# Patient Record
Sex: Female | Born: 1945 | Race: Asian | Hispanic: No | Marital: Married | Smoking: Never smoker
Health system: Southern US, Community
[De-identification: ages and names within clinical notes are randomized; demographics above are authoritative.]

## PROBLEM LIST (undated history)

## (undated) DIAGNOSIS — E119 Type 2 diabetes mellitus without complications: Secondary | ICD-10-CM

## (undated) DIAGNOSIS — I10 Essential (primary) hypertension: Secondary | ICD-10-CM

## (undated) DIAGNOSIS — R7303 Prediabetes: Secondary | ICD-10-CM

## (undated) DIAGNOSIS — E785 Hyperlipidemia, unspecified: Secondary | ICD-10-CM

## (undated) DIAGNOSIS — D649 Anemia, unspecified: Secondary | ICD-10-CM

## (undated) HISTORY — DX: Hyperlipidemia, unspecified: E78.5

## (undated) HISTORY — DX: Anemia, unspecified: D64.9

## (undated) HISTORY — DX: Prediabetes: R73.03

## (undated) HISTORY — DX: Type 2 diabetes mellitus without complications: E11.9

## (undated) HISTORY — DX: Essential (primary) hypertension: I10

---

## 2003-11-16 ENCOUNTER — Emergency Department (HOSPITAL_COMMUNITY): Admission: EM | Admit: 2003-11-16 | Discharge: 2003-11-16 | Payer: Self-pay | Admitting: Emergency Medicine

## 2011-10-19 ENCOUNTER — Ambulatory Visit (INDEPENDENT_AMBULATORY_CARE_PROVIDER_SITE_OTHER): Payer: Medicare Other | Admitting: Emergency Medicine

## 2011-10-19 VITALS — BP 170/81 | HR 70 | Temp 98.5°F | Resp 18 | Ht 60.5 in | Wt 131.2 lb

## 2011-10-19 DIAGNOSIS — S335XXA Sprain of ligaments of lumbar spine, initial encounter: Secondary | ICD-10-CM

## 2011-10-19 DIAGNOSIS — S39012A Strain of muscle, fascia and tendon of lower back, initial encounter: Secondary | ICD-10-CM

## 2011-10-19 MED ORDER — CYCLOBENZAPRINE HCL 10 MG PO TABS: 10.0000 mg | ORAL_TABLET | Freq: Three times a day (TID) | ORAL | Status: AC | PRN

## 2011-10-19 MED ORDER — NAPROXEN SODIUM 550 MG PO TABS: 550.0000 mg | ORAL_TABLET | Freq: Two times a day (BID) | ORAL | Status: AC

## 2011-10-19 NOTE — Progress Notes (Signed)
   Date:  10/19/2011   Name:  Becky Turner   DOB:  09-10-1945   MRN:  [id number] Gender: female Age: 66 y.o.  PCP:  No primary provider on file.    Chief Complaint: Back Pain   History of Present Illness:  Becky Turner is a 66 y.o. pleasant patient who presents with the following:  Patient awoke on Saturday with pain in her low back in sacral area.  No history of injury or overuse.  No lifting, no falls.  Has history of prior low back pain.  No GI or GU incontinence.  No numbness or tingling, no radiation of pain.  Has tried OTC medications without relief of pain.    There is no problem list on file for this patient.   No past medical history on file.  No past surgical history on file.  History  Substance Use Topics  . Smoking status: Never Smoker   . Smokeless tobacco: Never Used  . Alcohol Use: No    No family history on file.  No Known Allergies  Medication list has been reviewed and updated.  No current outpatient prescriptions on file prior to visit.    Review of Systems:  As per HPI, otherwise negative.    Physical Examination: Filed Vitals:   10/19/11 1902  BP: 170/81  Pulse: 70  Temp: 98.5 F (36.9 C)  Resp: 18   Filed Vitals:   10/19/11 1902  Height: 5' 0.5" (1.537 m)  Weight: 131 lb 3.2 oz (59.512 kg)   Body mass index is 25.20 kg/(m^2). Ideal Body Weight: Weight in (lb) to have BMI = 25: 129.9    GEN: WDWN, NAD, Non-toxic, Alert & Oriented x 3 HEENT: Atraumatic, Normocephalic.  Ears and Nose: No external deformity. EXTR: No clubbing/cyanosis/edema NEURO: Normal gait.  PSYCH: Normally interactive. Conversant. Not depressed or anxious appearing.  Calm demeanor.  BACK:  Tender in sacrum bilaterally.  Gross motor and cerebellar intact.  DTR's intact and 2+ bilaterally knee and achilles.  Babinski negative  Assessment and Plan: Lumbar strain    Carmelina Dane, MD Meds ordered this encounter  Medications  . naproxen sodium (ANAPROX DS)  550 MG tablet    Sig: Take 1 tablet (550 mg total) by mouth 2 (two) times daily with a meal.    Dispense:  40 tablet    Refill:  0  . cyclobenzaprine (FLEXERIL) 10 MG tablet    Sig: Take 1 tablet (10 mg total) by mouth 3 (three) times daily as needed for muscle spasms.    Dispense:  30 tablet    Refill:  0   I have reviewed and agree with documentation. Robert P. Merla Riches, M.D.

## 2011-11-01 ENCOUNTER — Ambulatory Visit (INDEPENDENT_AMBULATORY_CARE_PROVIDER_SITE_OTHER): Payer: Medicare Other | Admitting: Family Medicine

## 2011-11-01 VITALS — BP 157/89 | HR 89 | Temp 97.6°F | Resp 17 | Ht 60.0 in | Wt 131.0 lb

## 2011-11-01 DIAGNOSIS — R42 Dizziness and giddiness: Secondary | ICD-10-CM

## 2011-11-01 MED ORDER — MECLIZINE HCL 25 MG PO TABS: 25.0000 mg | ORAL_TABLET | Freq: Three times a day (TID) | ORAL | Status: DC | PRN

## 2011-11-01 NOTE — Patient Instructions (Addendum)
Ch?ng Chng M?t (Vertigo) Chng m?t c ngh?a l b?n c?m th?y nh? b?n hay m?i th? xung quanh b?n ?ang di chuy?n khi th?c t? khng ph?i nh? v?y. Chng m?t c th? nguy hi?m n?u n x?y ra khi b?n ?ang ? n?i lm vi?c, li xe ho?c th?c hi?n cc ho?t ??ng kh kh?n.  NGUYN NHN  Chng m?t x?y ra khi c s? xung ??t c?a cc tn hi?u ???c g?i ln no t? h? th?ng th? gic v h? th?ng c?m gic trong c? th?. C nhi?u nguyn nhn khc nhau gy chng m?t, bao g?m:   Nhi?m trng, ??c bi?t l ? tai trong.   Ph?n ?ng x?u v?i m?t lo?i thu?c ho?c l?m d?ng r??u v thu?c men.   Cai ma ty ho?c r??u.   Thay ??i v? tr nhanh, ch?ng h?n nh? n?m xu?ng ho?c l?n mnh trn gi??ng.   ?au n?a ??u.   Dng mu ln no gi?m.   p l?c trong no t?ng sau ch?n th??ng ??u, nhi?m trng, kh?i u ho?c ch?y mu.  TRI?U CH?NG  N c th? c v? nh? l th? gi?i ?ang quay cu?ng ho?c b?n ?ang r?i xu?ng ??t. B?i v s? cn b?ng c?a b?n b? ??o l?n, chng m?t c th? khi?n b?n c?m th?y kh ch?u ? d? dy (bu?n nn) v nn m?a. B?n c th? c c? ??ng m?t ngoi  mu?n (gi?t c?u m?t). CH?N ?ON  Chng m?t th??ng ???c ch?n ?on b?ng cch khm tr?c ti?p. N?u khng xc ??nh ???c nguyn nhn gy chng m?t, chuyn gia ch?m [place] y t? c th? th?c hi?n cc xt nghi?m hnh ?nh, ch?ng h?n nh? ch?p MRI (t?o ?nh c?ng h??ng t?).  ?I?U TR?  H?u h?t cc tr??ng h?p chng m?t t? chng tiu tan m khng c?n ?i?u tr?Marland Kitchen Ty thu?c vo nguyn nhn, chuyn gia ch?m [place] y t? c th? k m?t s? thu?c nh?t ??nh. N?u chng m?t c lin quan ??n cc v?n ?? v? v? tr c? th?, chuyn gia ch?m [place] y t? c th? ?? xu?t cc ??ng tc ho?c ph??ng php ?? kh?c ph?c v?n ?? ny. Trong m?t s? tr??ng h?p hi?m g?p, n?u chng m?t do m?t s? v?n ?? v? tai trong, b?n c th? c?n ph?u thu?t.  H??NG D?N CH?M [place] T?I NH  Lm theo h??ng d?n c?a bc s?[place] li xe.   Trnh v?n hnh my mc n?ng.   Trnh th?c hi?n b?t c? nhi?m v? no c th? gy nguy hi?m cho b?n ho?c nh?ng ng??i khc trong tnh  tr?ng chng m?t.   Ni cho chuyn gia ch?m [place] y t? bi?t n?u b?n nh?n th?y lo?i thu?c no ? d??ng nh? l nguyn nhn gy chng m?t cho b?n. M?t s? thu?c ???c dng ?? ?i?u tr? tnh tr?ng chng m?t th?c s? c th? lm cho chng t?i t? h?n ? m?t s? ng??i.  HY NGAY L?P T?C THAM V?N V?I CHUYN GIA Y T? N?U:  Thu?c c v? nh? khng c tc d?ng lm h?t c?n chng m?t ho?c lm b?nh n?ng thm.   B?n c v?n ?? v? ni chuy?n, ?i b?, ?m y?u ho?c vi?c s? d?ng cnh tay, bn tay hay chn.   B?n b? ?au ??u n?ng.   Bu?n nn ho?c nn lin t?c ho?c n?ng h?n.   B?n b? thay ??i th? gic.   Thnh vin trong gia ?nh nh?n th?y cc thay ??i hnh vi.  Tnh tr?ng c?a b?n tr? nn n?ng h?n.  ??M B?O B?N:   Hi?u cc h??ng d?n ny.   S? theo di tnh tr?ng c?a mnh.   S? yu c?u tr? gip ngay l?p t?c n?u b?n c?m th?y khng kh?e ho?c tnh tr?ng tr? nn t?i h?n.  Document Released: 01/26/2005 Document Revised: 01/15/2011 [facility] Patient Information 2012 [place], [place].

## 2011-11-01 NOTE — Progress Notes (Signed)
  Subjective:    Patient ID: Kandis Nab, female    DOB: [birth year], 66 y.o.   MRN: [id number]  HPI  Sudden onset of vertigo,x 1 day, ringing in ears intermittently, when walking has to hold onto objects due to dizziness Denies any hearing loss or vomiting.  Back is doing ok.   Review of Systems No h/o vertigo, head trauma, no headache    Objective:   Physical Exam Alert, NAD, cooperative.  Seen with daughter Normal cranial nerves, scar over left cornea medial aspect, eom nornal, perla Heart regular no murmur Chest is clear Neck no bruit No thyromegaly  No swelling in neck Ears som on right  Neck is supple         Assessment & Plan:  Positional vertigo 1. Vertigo  meclizine (ANTIVERT) 25 MG tablet   Return 24 hours if no better

## 2011-11-21 DIAGNOSIS — Z23 Encounter for immunization: Secondary | ICD-10-CM | POA: Diagnosis not present

## 2012-11-26 DIAGNOSIS — Z23 Encounter for immunization: Secondary | ICD-10-CM | POA: Diagnosis not present

## 2013-11-26 DIAGNOSIS — Z23 Encounter for immunization: Secondary | ICD-10-CM | POA: Diagnosis not present

## 2014-09-04 ENCOUNTER — Ambulatory Visit (INDEPENDENT_AMBULATORY_CARE_PROVIDER_SITE_OTHER): Payer: Medicare Other | Admitting: Physician Assistant

## 2014-09-04 VITALS — BP 120/80 | HR 66 | Temp 97.8°F | Resp 18 | Ht 60.5 in | Wt 132.2 lb

## 2014-09-04 DIAGNOSIS — L237 Allergic contact dermatitis due to plants, except food: Secondary | ICD-10-CM | POA: Diagnosis not present

## 2014-09-04 DIAGNOSIS — Z7952 Long term (current) use of systemic steroids: Secondary | ICD-10-CM | POA: Diagnosis not present

## 2014-09-04 LAB — GLUCOSE, POCT (MANUAL RESULT ENTRY): POC Glucose: 73 mg/dl (ref 70–99)

## 2014-09-04 MED ORDER — PREDNISONE 20 MG PO TABS: ORAL_TABLET | ORAL | Status: DC

## 2014-09-04 NOTE — Progress Notes (Signed)
[facility] [address] [place] [address] [phone]  Date:  09/04/2014   Name:  Becky Turner   DOB:  Jul 15, 1945   MRN:  [id number]  PCP:  No primary care provider on file.    Chief Complaint: Poison Ivy   History of Present Illness:  This is a 69 y.o. female who is presenting with a rash x 2 days on her face, neck chest and arms. States 3 days ago she was doing yard work. She has had poison ivy before and reacts this way. Her face is red and puffy. Rash is pruritic. She denies fever, chills or difficulty breathing. She does not have a history of DM. She has not yet tried anything for her symptoms. States in the past she was prescribed a cream that helped.  Pt presented with her daughter who translated for her.  Review of Systems:  Review of Systems See HPI  There are no active problems to display for this patient.   Prior to Admission medications   Not on File    No Known Allergies  History reviewed. No pertinent past surgical history.  History  Substance Use Topics  . Smoking status: Never Smoker   . Smokeless tobacco: Never Used  . Alcohol Use: No    History reviewed. No pertinent family history.  Medication list has been reviewed and updated.  Physical Examination:  Physical Exam  Constitutional: She is oriented to person, place, and time. She appears well-developed and well-nourished. No distress.  HENT:  Head: Normocephalic and atraumatic.  Right Ear: Hearing normal.  Left Ear: Hearing normal.  Nose: Nose normal.  Mouth/Throat: Uvula is midline, oropharynx is clear and moist and mucous membranes are normal.  Eyes: Conjunctivae and lids are normal. Right eye exhibits no discharge. Left eye exhibits no discharge. No scleral icterus.  Cardiovascular: Normal rate, regular rhythm, normal heart sounds and normal pulses.   No murmur heard. Pulmonary/Chest: Effort normal and breath sounds normal. No respiratory distress. She has no  wheezes. She has no rhonchi. She has no rales.  Musculoskeletal: Normal range of motion.  Neurological: She is alert and oriented to person, place, and time.  Skin: Skin is warm, dry and intact.  Maculopapular rash over left arm, face, neck and upper back. Lesions on left arm with some in linear formation. Face is generally erythematous. Eyelids are puffy. Conjunctiva normal. No eye pain.  Psychiatric: She has a normal mood and affect. Her speech is normal and behavior is normal. Thought content normal.   BP 120/80 mmHg  Pulse 66  Temp(Src) 97.8 F (36.6 C) (Oral)  Resp 18  Ht 5' 0.5" (1.537 m)  Wt 132 lb 4 oz (59.988 kg)  BMI 25.39 kg/m2  SpO2 99%  Results for orders placed or performed in visit on 09/04/14  POCT glucose (manual entry)  Result Value Ref Range   POC Glucose 73 70 - 99 mg/dl   Assessment and Plan:  1. Poison ivy dermatitis 2. On prednisone therapy Will treat with oral prednisone - she will start in the AM to prevent insomnia. She will take benadryl to help with sleep tonight and apply cortisone cream sparing eyelids. Return if not getting better in 7-10 days. - predniSONE (DELTASONE) 20 MG tablet; Take 3 PO QAM x3days, 2 PO QAM x3days, 1 PO QAM x3days  Dispense: 18 tablet; Refill: 0 - POCT glucose (manual entry)   [place] Min[place]. Dyke Brackett, MHS [facility]  09/04/2014

## 2014-09-04 NOTE — Patient Instructions (Signed)
Take 3 tabs each day for 3 days, then 2 tabs each day for 3 days, then 1 tab each day for 3 days then stop. Wait to start prednisone until the morning. Prednisone can make her gain weight or feel jittery. If any behavioral changes, call the clinic. She can use benadryl at night to help her sleep. She can use cortisone cream twice a day but avoid around the eyes. Return if not getting better in 7-10 days or at any time if she develops difficulty breathing or worsening of symptoms.

## 2014-11-24 DIAGNOSIS — Z23 Encounter for immunization: Secondary | ICD-10-CM | POA: Diagnosis not present

## 2015-09-08 ENCOUNTER — Emergency Department (HOSPITAL_COMMUNITY): Payer: Medicare Other

## 2015-09-08 ENCOUNTER — Encounter (HOSPITAL_COMMUNITY): Payer: Self-pay | Admitting: *Deleted

## 2015-09-08 ENCOUNTER — Emergency Department (HOSPITAL_COMMUNITY)
Admission: EM | Admit: 2015-09-08 | Discharge: 2015-09-08 | Disposition: A | Discharge status: Home / Self Care | Payer: Medicare Other | Attending: Emergency Medicine | Admitting: Emergency Medicine

## 2015-09-08 DIAGNOSIS — H539 Unspecified visual disturbance: Secondary | ICD-10-CM | POA: Diagnosis not present

## 2015-09-08 DIAGNOSIS — Z79899 Other long term (current) drug therapy: Secondary | ICD-10-CM | POA: Insufficient documentation

## 2015-09-08 DIAGNOSIS — R201 Hypoesthesia of skin: Secondary | ICD-10-CM | POA: Diagnosis not present

## 2015-09-08 DIAGNOSIS — R42 Dizziness and giddiness: Secondary | ICD-10-CM | POA: Insufficient documentation

## 2015-09-08 DIAGNOSIS — R791 Abnormal coagulation profile: Secondary | ICD-10-CM | POA: Diagnosis not present

## 2015-09-08 DIAGNOSIS — R404 Transient alteration of awareness: Secondary | ICD-10-CM | POA: Diagnosis not present

## 2015-09-08 DIAGNOSIS — I161 Hypertensive emergency: Secondary | ICD-10-CM | POA: Diagnosis not present

## 2015-09-08 DIAGNOSIS — R0789 Other chest pain: Secondary | ICD-10-CM | POA: Diagnosis not present

## 2015-09-08 LAB — COMPREHENSIVE METABOLIC PANEL
ALT: 18 U/L (ref 14–54)
AST: 19 U/L (ref 15–41)
Albumin: 4.1 g/dL (ref 3.5–5.0)
Alkaline Phosphatase: 50 U/L (ref 38–126)
Anion gap: 9 (ref 5–15)
BUN: 7 mg/dL (ref 6–20)
CO2: 24 mmol/L (ref 22–32)
Calcium: 8.9 mg/dL (ref 8.9–10.3)
Chloride: 104 mmol/L (ref 101–111)
Creatinine, Ser: 0.67 mg/dL (ref 0.44–1.00)
GFR calc Af Amer: >60 mL/min (ref >60)
GFR calc non Af Amer: >60 mL/min (ref >60)
Glucose, Bld: 153 mg/dL — ABNORMAL HIGH (ref 65–99)
Potassium: 3.6 mmol/L (ref 3.5–5.1)
Sodium: 137 mmol/L (ref 135–145)
Total Bilirubin: 0.3 mg/dL (ref 0.3–1.2)
Total Protein: 7.9 g/dL (ref 6.5–8.1)

## 2015-09-08 LAB — URINALYSIS, ROUTINE W REFLEX MICROSCOPIC
Bilirubin Urine: NEGATIVE
Glucose, UA: NEGATIVE mg/dL
Ketones, ur: NEGATIVE mg/dL
Nitrite: NEGATIVE
Protein, ur: NEGATIVE mg/dL
Specific Gravity, Urine: 1.012 (ref 1.005–1.030)
pH: 7 (ref 5.0–8.0)

## 2015-09-08 LAB — RAPID URINE DRUG SCREEN, HOSP PERFORMED
Amphetamines: NOT DETECTED
Barbiturates: NOT DETECTED
Benzodiazepines: NOT DETECTED
Cocaine: NOT DETECTED
Opiates: NOT DETECTED
Tetrahydrocannabinol: NOT DETECTED

## 2015-09-08 LAB — DIFFERENTIAL
Basophils Absolute: 0 10*3/uL (ref 0.0–0.1)
Basophils Relative: 0 %
Eosinophils Absolute: 0 10*3/uL (ref 0.0–0.7)
Eosinophils Relative: 1 %
Lymphocytes Relative: 23 %
Lymphs Abs: 1.7 10*3/uL (ref 0.7–4.0)
Monocytes Absolute: 0.2 10*3/uL (ref 0.1–1.0)
Monocytes Relative: 3 %
Neutro Abs: 5.5 10*3/uL (ref 1.7–7.7)
Neutrophils Relative %: 73 %

## 2015-09-08 LAB — CBC
HCT: 45.9 % (ref 36.0–46.0)
Hemoglobin: 14.5 g/dL (ref 12.0–15.0)
MCH: 27.7 pg (ref 26.0–34.0)
MCHC: 31.6 g/dL (ref 30.0–36.0)
MCV: 87.8 fL (ref 78.0–100.0)
Platelets: 241 10*3/uL (ref 150–400)
RBC: 5.23 MIL/uL — ABNORMAL HIGH (ref 3.87–5.11)
RDW: 12.9 % (ref 11.5–15.5)
WBC: 7.5 10*3/uL (ref 4.0–10.5)

## 2015-09-08 LAB — URINE MICROSCOPIC-ADD ON

## 2015-09-08 LAB — CBG MONITORING, ED: Glucose-Capillary: 159 mg/dL — ABNORMAL HIGH (ref 65–99)

## 2015-09-08 LAB — I-STAT TROPONIN, ED: Troponin i, poc: 0 ng/mL (ref 0.00–0.08)

## 2015-09-08 LAB — I-STAT CHEM 8, ED
BUN: 7 mg/dL (ref 6–20)
Calcium, Ion: 1.08 mmol/L — ABNORMAL LOW (ref 1.12–1.23)
Chloride: 103 mmol/L (ref 101–111)
Creatinine, Ser: 0.6 mg/dL (ref 0.44–1.00)
Glucose, Bld: 150 mg/dL — ABNORMAL HIGH (ref 65–99)
HCT: 48 % — ABNORMAL HIGH (ref 36.0–46.0)
Hemoglobin: 16.3 g/dL — ABNORMAL HIGH (ref 12.0–15.0)
Potassium: 3.8 mmol/L (ref 3.5–5.1)
Sodium: 142 mmol/L (ref 135–145)
TCO2: 26 mmol/L (ref 0–100)

## 2015-09-08 LAB — ETHANOL: Alcohol, Ethyl (B): <5 mg/dL (ref <5)

## 2015-09-08 LAB — PROTIME-INR
INR: 1.01
Prothrombin Time: 13.3 seconds (ref 11.4–15.2)

## 2015-09-08 LAB — APTT: aPTT: 33 seconds (ref 24–36)

## 2015-09-08 MED ORDER — MECLIZINE HCL 12.5 MG PO TABS: 12.5000 mg | ORAL_TABLET | Freq: Two times a day (BID) | ORAL | 0 refills | Status: AC | PRN

## 2015-09-08 MED ORDER — MECLIZINE HCL 25 MG PO TABS
12.5000 mg | ORAL_TABLET | Freq: Once | ORAL | Status: AC
  Administered 2015-09-08: 12.5 mg via ORAL
  Filled 2015-09-08: qty 1

## 2015-09-08 NOTE — ED Notes (Signed)
[facility] contacted to call code stroke

## 2015-09-08 NOTE — ED Notes (Signed)
Pt's CBG result was 159. Informed Joni Reining - RN.

## 2015-09-08 NOTE — ED Notes (Signed)
Pt is in stable condition upon d/c and ambulates from ED. 

## 2015-09-08 NOTE — ED Provider Notes (Signed)
[facility]-EMERGENCY DEPT Provider Note   CSN: [id number] Arrival date & time: 09/08/15  1119  First Provider Contact:  None       History   Chief Complaint Chief Complaint  Patient presents with  . Code Stroke    HPI Becky Turner is a 70 y.o. female.  HPI  Patient with a history of hypertension presents by EMS for concern for dizziness. She speaks [place] and daughter provided history.  She reports 4 hours prior to presentation she's had acute onset of dizziness. This started when she woke upand has persisted since then. Denies numbness or weakness in extremities. Denies syncope. Denies headache. Denies chest pain or shortness of breath.  History reviewed. No pertinent past medical history.  There are no active problems to display for this patient.   History reviewed. No pertinent surgical history.  OB History    No data available       Home Medications    Prior to Admission medications   Medication Sig Start Date End Date Taking? Authorizing Provider  meclizine (ANTIVERT) 12.5 MG tablet Take 1 tablet (12.5 mg total) by mouth 2 (two) times daily as needed for dizziness. 09/08/15 09/13/15  Marcelina Morel, MD    Family History History reviewed. No pertinent family history.  Social History Social History  Substance Use Topics  . Smoking status: Never Smoker  . Smokeless tobacco: Never Used  . Alcohol use No     Allergies   Aspirin   Review of Systems Review of Systems  Constitutional: Negative for chills and fever.  HENT: Negative for ear pain and sore throat.   Eyes: Negative for pain and visual disturbance.  Respiratory: Negative for cough and shortness of breath.   Cardiovascular: Negative for chest pain and palpitations.  Gastrointestinal: Negative for abdominal pain and vomiting.  Genitourinary: Negative for dysuria and hematuria.  Musculoskeletal: Negative for arthralgias and back pain.  Skin: Negative for color change and rash.  Neurological:  Positive for dizziness. Negative for seizures and syncope.  All other systems reviewed and are negative.    Physical Exam Updated Vital Signs BP 178/73   Pulse 85   Resp (!) 30   SpO2 96%   Physical Exam  Constitutional: She appears well-developed and well-nourished. No distress.  HENT:  Head: Normocephalic and atraumatic.  Eyes: Conjunctivae are normal.  Neck: Neck supple.  Cardiovascular: Normal rate and regular rhythm.   No murmur heard. Pulmonary/Chest: Effort normal and breath sounds normal. No respiratory distress.  Abdominal: Soft. There is no tenderness.  Musculoskeletal: She exhibits no edema.  Neurological: She is alert.  CN II-XII grossly intact Eyes: PERRL, EOMI Bilateral UE strength 5/5 Bilateral LE strength 5/5 Finger-to-nose Normal Gait normal   Skin: Skin is warm and dry.  Psychiatric: She has a normal mood and affect.  Nursing note and vitals reviewed.    ED Treatments / Results  Labs (all labs ordered are listed, but only abnormal results are displayed) Labs Reviewed  CBC - Abnormal; Notable for the following:       Result Value   RBC 5.23 (*)    All other components within normal limits  COMPREHENSIVE METABOLIC PANEL - Abnormal; Notable for the following:    Glucose, Bld 153 (*)    All other components within normal limits  URINALYSIS, ROUTINE W REFLEX MICROSCOPIC (NOT AT [facility]) - Abnormal; Notable for the following:    Hgb urine dipstick TRACE (*)    Leukocytes, UA TRACE (*)    All  other components within normal limits  URINE MICROSCOPIC-ADD ON - Abnormal; Notable for the following:    Squamous Epithelial / LPF 0-5 (*)    Bacteria, UA RARE (*)    All other components within normal limits  I-STAT CHEM 8, ED - Abnormal; Notable for the following:    Glucose, Bld 150 (*)    Calcium, Ion 1.08 (*)    Hemoglobin 16.3 (*)    HCT 48.0 (*)    All other components within normal limits  CBG MONITORING, ED - Abnormal; Notable for the following:     Glucose-Capillary 159 (*)    All other components within normal limits  ETHANOL  PROTIME-INR  APTT  DIFFERENTIAL  URINE RAPID DRUG SCREEN, HOSP PERFORMED  I-STAT TROPOININ, ED    EKG  EKG Interpretation None       Radiology Mr Brain Wo Contrast  Result Date: 09/08/2015 CLINICAL DATA:  Acute onset of dizziness earlier today. No neurologic deficit is reported. EXAM: MRI HEAD WITHOUT CONTRAST TECHNIQUE: Multiplanar, multiecho pulse sequences of the brain and surrounding structures were obtained without intravenous contrast. COMPARISON:  CT head earlier today. FINDINGS: No evidence for acute infarction, hemorrhage, mass lesion, hydrocephalus, or extra-axial fluid. A mild cerebral and cerebellar atrophy. Mild subcortical and periventricular T2 and FLAIR hyperintensities, likely chronic microvascular ischemic change. Tiny lacune LEFT lentiform nucleus. Flow voids are maintained. Tiny focus of chronic hemorrhage LEFT middle cerebellar peduncle, likely sequelae of hypertensive cerebrovascular disease. Partial empty sella. Pineal and cerebellar tonsils unremarkable mild pannus surrounds the odontoid, without significant cervicomedullary compression. Visualized calvarium, skull base, and upper cervical osseous structures unremarkable. Scalp and extracranial soft tissues, orbits, sinuses, and mastoids show no acute process. IMPRESSION: Mild atrophy.  Mild chronic microvascular ischemic change. No acute intracranial findings. Specifically no evidence for posterior circulation ischemic event. Electronically Signed   By: Elsie Stain M.D.   On: 09/08/2015 14:06  Ct Head Code Stroke W/o Cm  Result Date: 09/08/2015 CLINICAL DATA:  Dizziness beginning today. Numbness in feet bilaterally. EXAM: CT HEAD WITHOUT CONTRAST TECHNIQUE: Contiguous axial images were obtained from the base of the skull through the vertex without intravenous contrast. COMPARISON:  None. FINDINGS: Mild age related volume loss. No acute  intracranial abnormality. Specifically, no hemorrhage, hydrocephalus, mass lesion, acute infarction, or significant intracranial injury. No acute calvarial abnormality. Visualized paranasal sinuses and mastoids clear. Orbital soft tissues unremarkable. IMPRESSION: No acute intracranial abnormality. Electronically Signed   By: Charlett Nose M.D.   On: 09/08/2015 11:41   Procedures Procedures (including critical care time)  Medications Ordered in ED Medications  meclizine (ANTIVERT) tablet 12.5 mg (12.5 mg Oral Given 09/08/15 1248)     Initial Impression / Assessment and Plan / ED Course  I have reviewed the triage vital signs and the nursing notes.  Pertinent labs & imaging results that were available during my care of the patient were reviewed by me and considered in my medical decision making (see chart for details).  Clinical Course    Patient presented with persistent dizziness over 4 hours. Code stroke was called for concernfor posterior circulation stroke. Neurology evaluated her and did not witness acute stroke. CT of the head was unremarkable. Lab workup here was also unremarkable. MRI was performed and did not show signs of stroke.  Feel this is a peripheral vertigo.  Symptoms improved with meclizine.  Patient reassessed and dizziness was improved, patient had no neurologic deficits, including normal gait, on reassessment.  Will have her use very low  dose antivert at home and f/u closely with primary doctor.  We have discussed the discharge plan, including the plan for outpatient followup, and strict return precautions, including those that would require calling [phone].     Final Clinical Impressions(s) / ED Diagnoses   Final diagnoses:  Vertigo    New Prescriptions Discharge Medication List as of 09/08/2015  2:43 PM    START taking these medications   Details  meclizine (ANTIVERT) 12.5 MG tablet Take 1 tablet (12.5 mg total) by mouth 2 (two) times daily as needed for  dizziness., Starting Sun 09/08/2015, Until Fri 09/13/2015, Print         Marcelina Morel, MD 09/08/15 1542    Leta Baptist, MD 09/09/15 2112

## 2015-09-08 NOTE — Consult Note (Signed)
Initial Neurological Consultation                      NEURO HOSPITALIST CONSULT NOTE   Requestig physician: Dr. Cyndie Chime   Reason for Consult:  Dizziness, slight numbness of the feet, mild gait imbalance.   HPI:                                                                                                                                          Becky Turner is a lovely 70 year old patient who presents to the emergency room with a complaint of dizziness that began approximately 3-1/2 hours ago. She reports no other focal neurological complaints and no other cranial nerve symptoms. She has a sensation of mild gait and balance and some mild numbness in the feet. There is no other known past medical history. Her blood sugars were noted to be mildly elevated.  History reviewed. No pertinent past medical history.  History reviewed. No pertinent surgical history.  MEDICATIONS:                                                                                                                     I have reviewed the patient's current medications.  No Known Allergies   Social History:  reports that she has never smoked. She has never used smokeless tobacco. She reports that she does not drink alcohol or use drugs.  History reviewed. No pertinent family history.   ROS:                                                                                                                                       History obtained from chart review  General ROS: negative for - chills, fatigue, fever, night sweats, weight gain or weight loss Psychological ROS: negative for -  behavioral disorder, hallucinations, memory difficulties, mood swings or suicidal ideation Ophthalmic ROS: negative for - blurry vision, double vision, eye pain or loss of vision ENT ROS: negative for - epistaxis, nasal discharge, oral lesions, sore throat, tinnitus or vertigo Allergy and Immunology ROS: negative for - hives or itchy/watery  eyes Hematological and Lymphatic ROS: negative for - bleeding problems, bruising or swollen lymph nodes Endocrine ROS: negative for - galactorrhea, hair pattern changes, polydipsia/polyuria or temperature intolerance Respiratory ROS: negative for - cough, hemoptysis, shortness of breath or wheezing Cardiovascular ROS: negative for - chest pain, dyspnea on exertion, edema or irregular heartbeat Gastrointestinal ROS: negative for - abdominal pain, diarrhea, hematemesis, nausea/vomiting or stool incontinence Genito-Urinary ROS: negative for - dysuria, hematuria, incontinence or urinary frequency/urgency Musculoskeletal ROS: negative for - joint swelling or muscular weakness Neurological ROS: as noted in HPI Dermatological ROS: negative for rash and skin lesion changes   General Exam                                                                                                      Blood pressure 185/68, pulse 79, resp. rate 18, SpO2 98 %. HEENT-  Normocephalic, no lesions, without obvious abnormality.  Normal external eye and conjunctiva.  Normal TM's bilaterally.  Normal auditory canals and external ears. Normal external nose, mucus membranes and septum.  Normal pharynx. Cardiovascular- regular rate and rhythm, S1, S2 normal, no murmur, click, rub or gallop, pulses palpable throughout   Lungs- chest clear, no wheezing, rales, normal symmetric air entry, Heart exam - S1, S2 normal, no murmur, no gallop, rate regular Abdomen- soft, non-tender; bowel sounds normal; no masses,  no organomegaly Extremities- less then 2 second capillary refill Lymph-no adenopathy palpable Musculoskeletal-no joint tenderness, deformity or swelling Skin-warm and dry, no hyperpigmentation, vitiligo, or suspicious lesions  Neurological Examination Mental Status: Alert, oriented, thought content appropriate.  Speech fluent without evidence of aphasia.  Able to follow 3 step commands without difficulty. Cranial  Nerves: II: Discs flat bilaterally; Visual fields grossly normal, pupils equal, round, reactive to light and accommodation III,IV, VI: ptosis not present, extra-ocular motions intact bilaterally V,VII: smile symmetric, facial light touch sensation normal bilaterally VIII: hearing normal bilaterally IX,X: uvula rises symmetrically XI: bilateral shoulder shrug XII: midline tongue extension Motor: Right : Upper extremity   5/5    Left:     Upper extremity   5/5  Lower extremity   5/5     Lower extremity   5/5 Tone and bulk:normal tone throughout; no atrophy noted Sensory: Pinprick and light touch intact throughout, bilaterally Deep Tendon Reflexes: 2+ and symmetric throughout Plantars: Right: downgoing   Left: downgoing Cerebellar: normal finger-to-nose, normal rapid alternating movements      Lab Results: Basic Metabolic Panel:  Recent Labs Lab 09/08/15 1140  NA 142  K 3.8  CL 103  GLUCOSE 150*  BUN 7  CREATININE 0.60    Liver Function Tests: No results for input(s): AST, ALT, ALKPHOS, BILITOT, PROT, ALBUMIN in the last 168 hours. No results for input(s): LIPASE, AMYLASE in the last 168 hours.  No results for input(s): AMMONIA in the last 168 hours.  CBC:  Recent Labs Lab 09/08/15 1140  HGB 16.3*  HCT 48.0*    Cardiac Enzymes: No results for input(s): CKTOTAL, CKMB, CKMBINDEX, TROPONINI in the last 168 hours.  Lipid Panel: No results for input(s): CHOL, TRIG, HDL, CHOLHDL, VLDL, LDLCALC in the last 168 hours.  CBG:  Recent Labs Lab 09/08/15 1135  GLUCAP 159*    Microbiology: No results found for this or any previous visit.  Coagulation Studies: No results for input(s): LABPROT, INR in the last 72 hours.  Imaging: Ct Head Code Stroke W/o Cm  Result Date: 09/08/2015 CLINICAL DATA:  Dizziness beginning today. Numbness in feet bilaterally. EXAM: CT HEAD WITHOUT CONTRAST TECHNIQUE: Contiguous axial images were obtained from the base of the skull through  the vertex without intravenous contrast. COMPARISON:  None. FINDINGS: Mild age related volume loss. No acute intracranial abnormality. Specifically, no hemorrhage, hydrocephalus, mass lesion, acute infarction, or significant intracranial injury. No acute calvarial abnormality. Visualized paranasal sinuses and mastoids clear. Orbital soft tissues unremarkable. IMPRESSION: No acute intracranial abnormality. Electronically Signed   By: Charlett Nose M.D.   On: 09/08/2015 11:41   Assessment/Plan:  Becky Turner is a lovely 70 year old patient who presents with a history of some mild dizziness that began 3-1/2 hours ago. She felt slightly off balance. She was also noted to have some mild numbness of the feet. She has undergone CT of the brain. This revealed mild atrophy but no other mass lesions or areas of ischemic change. There was no evidence of hemorrhage.  The differential diagnosis for dizziness is quite broad and includes a number of different disorders. Neurologic exam is unremarkable at this time with no focal neurological complaints. A posterior fossa ischemic event or brainstem infarct is a potential consideration but appears slightly less likely given the specific details of this presentation.  The patient is also noted to be hypertensive. This could be a factor contrasting to the dizziness.  Plan:  1. In the absence of any other clear explanation for the dizziness once the workup has been completed, consideration can be given to MRI to rule out an ischemic event.  2. We'll follow her progress with you.  Thank you for consulting the neurology service to assist in the care of your patient!    Kilian Schwartz A. Hilda Blades, M.D. Neurohospitalist Phone: [phone]  09/08/2015, 12:03 PM

## 2015-09-08 NOTE — ED Notes (Signed)
Patient transported to MRI 

## 2015-09-10 ENCOUNTER — Ambulatory Visit (INDEPENDENT_AMBULATORY_CARE_PROVIDER_SITE_OTHER): Payer: Medicare Other | Admitting: Family Medicine

## 2015-09-10 VITALS — BP 182/88 | HR 90 | Temp 98.0°F | Resp 18 | Ht 60.5 in | Wt 131.0 lb

## 2015-09-10 DIAGNOSIS — R739 Hyperglycemia, unspecified: Secondary | ICD-10-CM

## 2015-09-10 DIAGNOSIS — R42 Dizziness and giddiness: Secondary | ICD-10-CM

## 2015-09-10 DIAGNOSIS — I10 Essential (primary) hypertension: Secondary | ICD-10-CM | POA: Diagnosis not present

## 2015-09-10 DIAGNOSIS — R7309 Other abnormal glucose: Secondary | ICD-10-CM

## 2015-09-10 DIAGNOSIS — R7303 Prediabetes: Secondary | ICD-10-CM | POA: Insufficient documentation

## 2015-09-10 DIAGNOSIS — G4700 Insomnia, unspecified: Secondary | ICD-10-CM

## 2015-09-10 DIAGNOSIS — E1129 Type 2 diabetes mellitus with other diabetic kidney complication: Secondary | ICD-10-CM | POA: Insufficient documentation

## 2015-09-10 MED ORDER — MELATONIN 5 MG PO TABS: 1.0000 | ORAL_TABLET | Freq: Every evening | ORAL | 1 refills | Status: DC | PRN

## 2015-09-10 MED ORDER — HYDROCHLOROTHIAZIDE 12.5 MG PO TABS: 12.5000 mg | ORAL_TABLET | Freq: Every day | ORAL | 3 refills | Status: DC

## 2015-09-10 NOTE — Assessment & Plan Note (Signed)
Consider Hgb A1c in future, did not wish to get this today.  Blood glucose 150's in hospital.

## 2015-09-10 NOTE — Patient Instructions (Addendum)
  Please take blood pressure medication, will need labs in 2 weeks.  Go to ED for any increased dizziness, weakness, numbness.  Follow up this weekend for BP recheck.   IF you received an x-ray today, you will receive an invoice from [facility]. Please contact [facility] at [phone] with questions or concerns regarding your invoice.   IF you received labwork today, you will receive an invoice from [employer]. Please contact [facility] at [phone] with questions or concerns regarding your invoice.   Our billing staff will not be able to assist you with questions regarding bills from these companies.  You will be contacted with the lab results as soon as they are available. The fastest way to get your results is to activate your My Chart account. Instructions are located on the last page of this paperwork. If you have not heard from [place] regarding the results in 2 weeks, please contact this office.

## 2015-09-10 NOTE — Progress Notes (Signed)
Subjective:    Patient ID: Becky Turner, female    DOB: 1945/09/11, 70 y.o.   MRN: [id number]  HPI Presents for dizziness follow up from ED on 09/08/15 that lasted 4 hours. Stroke code was called and was ruled out with CT, MRI.  Neurology was consulted and did not find any focal neurologic deficits.  She was diagnosed with peripheral vertigo. EKG showed some borderline irregularities, but no acute findings.  BP was originally 178/73 and glucose 153.  She was discharged with meclizine.  Her daughter reports that at one point it was above 200 SBP.    She now is having reduced dizziness with the meclizine, but has noted new insomnia.  She also complains of ongoing paresthesias that were present during her ED evaluation.  They are on all 4 extremities intermittently without being related to change in position.  She has a mild headache, but no visual changes. Denies chest pain, shortness of breath, cough, palpitations.  She states that she has not been able to sleep much over the past 2 days.     Translator was used #, as well her daughter, for translating from [place]  Review of Systems  Constitutional: Negative for chills, fatigue and fever.  HENT: Negative for congestion and rhinorrhea.   Eyes: Negative for photophobia and redness.  Respiratory: Negative for cough, chest tightness and shortness of breath.   Cardiovascular: Negative for chest pain, palpitations and leg swelling.  Gastrointestinal: Negative for abdominal pain and nausea.  Endocrine: Negative for polydipsia, polyphagia and polyuria.  Musculoskeletal: Negative for arthralgias and joint swelling.  Skin: Negative for rash and wound.  Neurological: Positive for dizziness and headaches. Negative for syncope and weakness.  Psychiatric/Behavioral: Negative for agitation and confusion.  All other systems reviewed and are negative.      Objective:   Physical Exam  Constitutional: She is oriented to person, place, and time. She  appears well-developed and well-nourished. No distress.  HENT:  Head: Normocephalic and atraumatic.  Right Ear: External ear normal.  Left Ear: External ear normal.  Eyes: Conjunctivae are normal. Pupils are equal, round, and reactive to light. Right eye exhibits no discharge. No scleral icterus.    Neck: Normal range of motion. Neck supple.  Cardiovascular: Normal rate, regular rhythm, normal heart sounds and intact distal pulses.  Exam reveals no gallop and no friction rub.   No murmur heard. No carotid bruits  Pulmonary/Chest: Effort normal and breath sounds normal. No respiratory distress. She has no wheezes. She has no rales. She exhibits no tenderness.  Musculoskeletal: Normal range of motion. She exhibits no edema.  Neurological: She is alert and oriented to person, place, and time. She has normal strength. She displays no atrophy and no tremor. No cranial nerve deficit or sensory deficit. She exhibits normal muscle tone. Coordination normal.  Skin: Skin is warm. No rash noted. She is not diaphoretic. No erythema.  Psychiatric: She has a normal mood and affect. Her behavior is normal. Judgment and thought content normal.  Nursing note and vitals reviewed.  BP (!) 182/88 (BP Location: Right Arm, Patient Position: Sitting, Cuff Size: Normal)   Pulse 90   Temp 98 F (36.7 C)   Resp 18   Ht 5' 0.5" (1.537 m)   Wt 131 lb (59.4 kg)   SpO2 96%   BMI 25.16 kg/m         Assessment & Plan:  Essential hypertension Will place on low dose HCTZ and recheck this weekend.  Daughter  would like pt to be seen by a certain doctor this weekend.  Will need blood work in 2 weeks to recheck kidneys.  CMP in hospital was normal except elevated blood sugar.  Will reassess neurologic status after blood pressure decreased.  CMP, CBC within normal limits and gives no signs of B12 deficiency due to normocytic, normochromic.  Can also consider TSH in the future and checking A1c, as blood glucose was in  150's.  Patient and daughter wanted to do labwork at a later time.    Dizziness and giddiness Likely due to elevated blood pressure vs peripheral vertigo.  Continue meclizine as needed.  Could also have insomnia as a contributing factor.    Elevated blood sugar Consider Hgb A1c in future, did not wish to get this today.  Blood glucose 150's in hospital.    Insomnia Will give melatonin and follow up as needed.  Would not do a sedating insomnia aide, as she is dizzy.

## 2015-09-10 NOTE — Assessment & Plan Note (Signed)
Will give melatonin and follow up as needed.  Would not do a sedating insomnia aide, as she is dizzy.

## 2015-09-10 NOTE — Assessment & Plan Note (Addendum)
Will place on low dose HCTZ and recheck this weekend.  Daughter would like pt to be seen by a certain doctor this weekend.  Will need blood work in 2 weeks to recheck kidneys.  CMP in hospital was normal except elevated blood sugar.  Will reassess neurologic status after blood pressure decreased.  CMP, CBC within normal limits and gives no signs of B12 deficiency due to normocytic, normochromic.  Can also consider TSH in the future and checking A1c, as blood glucose was in 150's.  Patient and daughter wanted to do labwork at a later time.

## 2015-09-10 NOTE — Assessment & Plan Note (Addendum)
Likely due to elevated blood pressure vs peripheral vertigo.  Continue meclizine as needed.  Could also have insomnia as a contributing factor.

## 2015-09-11 NOTE — Progress Notes (Signed)
Patient discussed with Dr. Zachery Dauer. Agree with assessment and plan of care per her note.  Signed,   Meredith Staggers, MD [facility].  09/11/15 8:48 AM

## 2015-09-14 ENCOUNTER — Ambulatory Visit (INDEPENDENT_AMBULATORY_CARE_PROVIDER_SITE_OTHER): Payer: Medicare Other | Admitting: Emergency Medicine

## 2015-09-14 ENCOUNTER — Ambulatory Visit (INDEPENDENT_AMBULATORY_CARE_PROVIDER_SITE_OTHER): Payer: Medicare Other

## 2015-09-14 VITALS — BP 148/85 | HR 98 | Temp 97.7°F | Resp 17 | Ht 62.0 in | Wt 127.0 lb

## 2015-09-14 DIAGNOSIS — R7309 Other abnormal glucose: Secondary | ICD-10-CM

## 2015-09-14 DIAGNOSIS — Z1159 Encounter for screening for other viral diseases: Secondary | ICD-10-CM | POA: Diagnosis not present

## 2015-09-14 DIAGNOSIS — N390 Urinary tract infection, site not specified: Secondary | ICD-10-CM

## 2015-09-14 DIAGNOSIS — R42 Dizziness and giddiness: Secondary | ICD-10-CM | POA: Diagnosis not present

## 2015-09-14 DIAGNOSIS — I10 Essential (primary) hypertension: Secondary | ICD-10-CM

## 2015-09-14 DIAGNOSIS — R739 Hyperglycemia, unspecified: Secondary | ICD-10-CM

## 2015-09-14 DIAGNOSIS — R350 Frequency of micturition: Secondary | ICD-10-CM | POA: Diagnosis not present

## 2015-09-14 DIAGNOSIS — R109 Unspecified abdominal pain: Secondary | ICD-10-CM | POA: Diagnosis not present

## 2015-09-14 DIAGNOSIS — R197 Diarrhea, unspecified: Secondary | ICD-10-CM | POA: Diagnosis not present

## 2015-09-14 DIAGNOSIS — R000 Tachycardia, unspecified: Secondary | ICD-10-CM

## 2015-09-14 DIAGNOSIS — R8281 Pyuria: Secondary | ICD-10-CM

## 2015-09-14 DIAGNOSIS — R1032 Left lower quadrant pain: Secondary | ICD-10-CM

## 2015-09-14 LAB — POC MICROSCOPIC URINALYSIS (UMFC)

## 2015-09-14 LAB — POCT CBC
Granulocyte percent: 60.1 %G (ref 37–80)
HCT, POC: 43.3 % (ref 37.7–47.9)
HEMOGLOBIN: 14.7 g/dL (ref 12.2–16.2)
LYMPH, POC: 2.5 (ref 0.6–3.4)
MCH: 28.4 pg (ref 27–31.2)
MCHC: 34 g/dL (ref 31.8–35.4)
MCV: 83.5 fL (ref 80–97)
MID (cbc): 0.3 (ref 0–0.9)
MPV: 7.1 fL (ref 0–99.8)
POC GRANULOCYTE: 4.3 (ref 2–6.9)
POC LYMPH PERCENT: 35.8 %L (ref 10–50)
POC MID %: 4.1 % (ref 0–12)
Platelet Count, POC: 275 10*3/uL (ref 142–424)
RBC: 5.18 M/uL (ref 4.04–5.48)
RDW, POC: 13 %
WBC: 7.1 10*3/uL (ref 4.6–10.2)

## 2015-09-14 LAB — BASIC METABOLIC PANEL WITH GFR
BUN: 14 mg/dL (ref 7–25)
CALCIUM: 9.7 mg/dL (ref 8.6–10.4)
CO2: 28 mmol/L (ref 20–31)
Chloride: 99 mmol/L (ref 98–110)
Creat: 0.92 mg/dL (ref 0.60–0.93)
GFR, EST AFRICAN AMERICAN: 73 mL/min (ref >=60)
GFR, Est Non African American: 63 mL/min (ref >=60)
GLUCOSE: 141 mg/dL — AB (ref 65–99)
Potassium: 4.3 mmol/L (ref 3.5–5.3)
Sodium: 138 mmol/L (ref 135–146)

## 2015-09-14 LAB — POCT URINALYSIS DIP (MANUAL ENTRY)
BILIRUBIN UA: NEGATIVE
GLUCOSE UA: NEGATIVE
Ketones, POC UA: NEGATIVE
NITRITE UA: NEGATIVE
Protein Ur, POC: 30 — AB
Spec Grav, UA: 1.015
Urobilinogen, UA: 0.2
pH, UA: 5.5

## 2015-09-14 LAB — GLUCOSE, POCT (MANUAL RESULT ENTRY): POC GLUCOSE: 122 mg/dL — AB (ref 70–99)

## 2015-09-14 LAB — HEPATITIS C ANTIBODY: HCV AB: NEGATIVE

## 2015-09-14 MED ORDER — LOSARTAN POTASSIUM 25 MG PO TABS
25.0000 mg | ORAL_TABLET | Freq: Every day | ORAL | 11 refills | Status: DC
Start: 2015-09-14 — End: 2015-09-20

## 2015-09-14 MED ORDER — METOPROLOL SUCCINATE ER 25 MG PO TB24: 25.0000 mg | ORAL_TABLET | Freq: Every day | ORAL | 3 refills | Status: DC

## 2015-09-14 NOTE — Progress Notes (Signed)
By signing my name below, I, Stann Ore, attest that this documentation has been prepared under the direction and in the presence of Lesle Chris, MD. Electronically Signed: Stann Ore, Scribe. 09/14/2015 , 8:31 AM .  Patient was seen in room 10 .  Chief Complaint:  Chief Complaint  Patient presents with  . Hypertension    HPI: Becky Turner is a 70 y.o. female who reports to [facility] today complaining of elevated blood pressure. She was seen at [facility] ED for dizziness 6 days ago with elevated BP. She had hospitalization follow up 4 days ago with Dr. Zachery Dauer. She received HCTZ 12.5mg  qd.   Patient returns with continued elevated BP today and diarrhea with abdominal pain. Her BP was 168 this morning, and 164/107 during triage. She mentions having some diarrhea with abdominal pain that started yesterday and into today. She also noticed increased urinary frequency. She also notes some numbness in her feet. She denies dysuria, fever, vomiting, or chest pain. She denies seeing her eye doctor recently. She denies any recent international travel.   Patient's primary language is [place]; her daughter translated for patient in room.   No past medical history on file. No past surgical history on file. Social History   Social History  . Marital status: Married    Spouse name: N/A  . Number of children: N/A  . Years of education: N/A   Social History Main Topics  . Smoking status: Never Smoker  . Smokeless tobacco: Never Used  . Alcohol use No  . Drug use: No  . Sexual activity: Not Asked   Other Topics Concern  . None   Social History Narrative  . None   No family history on file. Allergies  Allergen Reactions  . Aspirin Nausea And Vomiting   Prior to Admission medications   Medication Sig Start Date End Date Taking? Authorizing Provider  hydrochlorothiazide (HYDRODIURIL) 12.5 MG tablet Take 1 tablet (12.5 mg total) by mouth daily. 09/10/15  Yes Alicia B Chitanand, DO    Melatonin 5 MG TABS Take 1 tablet (5 mg total) by mouth at bedtime as needed (insomnia). 09/10/15  Yes Alicia B Chitanand, DO     ROS:  Constitutional: negative for fever, chills, night sweats, weight changes, or fatigue  HEENT: negative for vision changes, hearing loss, congestion, rhinorrhea, ST, epistaxis, or sinus pressure Cardiovascular: negative for chest pain or palpitations Respiratory: negative for hemoptysis, wheezing, shortness of breath, or cough Abdominal: negative for nausea, vomiting, or constipation; positive for abd pain, diarrhea Dermatological: negative for rash Neurologic: negative for headache, dizziness, or syncope, negative for numbness All other systems reviewed and are otherwise negative with the exception to those above and in the HPI.  PHYSICAL EXAM: Vitals:   09/14/15 0808  BP: (!) 164/104  Pulse: (!) 112  Resp: 17  Temp: 98.5 F (36.9 C)   Body mass index is 23.23 kg/m.   General: Alert, no acute distress HEENT:  Normocephalic, atraumatic, oropharynx patent. Eye: EOMI, PEERLDC; Large pterygium with left cataract Cardiovascular:  slight irregularity no murmur, rubs or gallops.  No Carotid bruits, radial pulse intact. No pedal edema.  Respiratory: Clear to auscultation bilaterally.  No wheezes, rales, or rhonchi.  No cyanosis, no use of accessory musculature Abdominal: tenderness over mid-epigastrium, and deep LLQ no rebound, No organomegaly, abdomen is soft and non-tender, positive bowel sounds. No masses. Musculoskeletal: Gait intact. No edema, tenderness Skin: No rashes. Neurologic: Facial musculature symmetric. Psychiatric: Patient acts appropriately throughout our  interaction.  Lymphatic: No cervical or submandibular lymphadenopathy Genitourinary/Anorectal: No acute findings BP recheck in room, sitting (left arm, manual) : 160/90  LABS: Results for orders placed or performed in visit on 09/14/15  POCT Microscopic Urinalysis ([facility])  Result  Value Ref Range   WBC,UR,HPF,POC Many (A) None WBC/hpf   RBC,UR,HPF,POC Few (A) None RBC/hpf   Bacteria None None, Too numerous to count   Mucus Present (A) Absent   Epithelial Cells, UR Per Microscopy Moderate (A) None, Too numerous to count cells/hpf   Yeast Present   POCT urinalysis dipstick  Result Value Ref Range   Color, UA yellow yellow   Clarity, UA clear clear   Glucose, UA negative negative   Bilirubin, UA negative negative   Ketones, POC UA negative negative   Spec Grav, UA 1.015    Blood, UA trace-lysed (A) negative   pH, UA 5.5    Protein Ur, POC =30 (A) negative   Urobilinogen, UA 0.2    Nitrite, UA Negative Negative   Leukocytes, UA small (1+) (A) Negative  POCT CBC  Result Value Ref Range   WBC 7.1 4.6 - 10.2 K/uL   Lymph, poc 2.5 0.6 - 3.4   POC LYMPH PERCENT 35.8 10 - 50 %L   MID (cbc) 0.3 0 - 0.9   POC MID % 4.1 0 - 12 %M   POC Granulocyte 4.3 2 - 6.9   Granulocyte percent 60.1 37 - 80 %G   RBC 5.18 4.04 - 5.48 M/uL   Hemoglobin 14.7 12.2 - 16.2 g/dL   HCT, POC 50.5 39.7 - 47.9 %   MCV 83.5 80 - 97 fL   MCH, POC 28.4 27 - 31.2 pg   MCHC 34.0 31.8 - 35.4 g/dL   RDW, POC 67.3 %   Platelet Count, POC 275 142 - 424 K/uL   MPV 7.1 0 - 99.8 fL  POCT glucose (manual entry)  Result Value Ref Range   POC Glucose 122 (A) 70 - 99 mg/dl     EKG/XRAY:   EKG: sinus rhythm 97, non specific ST-T changes  Dg Abd Acute W/chest  Result Date: 09/14/2015 CLINICAL DATA:  70 year old female with a history of mid abdominal pain EXAM: DG ABDOMEN ACUTE W/ 1V CHEST COMPARISON:  11/15/2013 FINDINGS: Chest: Cardiomediastinal silhouette within normal limits in size and contour. Calcifications of the aortic arch. No evidence of central vascular congestion. No confluent airspace disease, pneumothorax, or pleural effusion. Abdomen: Gas within stomach, small bowel, colon, without abnormal distention. No air-fluid levels. Unremarkable silhouette of the bilateral kidneys. No  unexpected calcifications. No unexpected soft tissue density. No radiopaque foreign body. No displaced fracture. Degenerative changes of the spine. Degenerative changes bilateral hips. IMPRESSION: Chest: No radiographic evidence of acute cardiopulmonary disease. Abdomen: Unremarkable appearance of the abdomen. Signed, Yvone Neu. Loreta Ave, DO [facility] Electronically Signed   By: Gilmer Mor D.O.   On: 09/14/2015 09:24     ASSESSMENT/PLAN: White count normal. acute abdominal series negative .she can use Imodium twice a day. We'll stop her HCTZ because she does not feel well on this medicine. Will add losartan 25 mg  Recheck one week.I personally performed the services described in this documentation, which was scribed in my presence. The recorded information has been reviewed and is accurate.  Gross sideeffects, risk and benefits, and alternatives of medications d/w patient. Patient is aware that all medications have potential sideeffects and we are unable to predict every sideeffect or  drug-drug interaction that may occur.  Lesle Chris MD 09/14/2015 8:16 AM

## 2015-09-14 NOTE — Patient Instructions (Addendum)
Please stop the blood pressure pill you have at home. Please make sure you drink enough fluids. I have started you on a new  BP  one a day. Please return to clinic with worsening abdominal pain or development of fever. Please call Thursday after 4pm to get an appointment next Friday. take Imodium right ear one twice a day for diarr. Use GYN Lotrimin cream to your vaginal area twice a day for yeast.  IF you received an x-ray today, you will receive an invoice from [facility]. Please contact [facility] Radiology at [phone] with questions or concerns regarding your invoice.   IF you received labwork today, you will receive an invoice from [employer]. Please contact [facility] at [phone] with questions or concerns regarding your invoice.   Our billing staff will not be able to assist you with questions regarding bills from these companies.  You will be contacted with the lab results as soon as they are available. The fastest way to get your results is to activate your My Chart account. Instructions are located on the last page of this paperwork. If you have not heard from [place] regarding the results in 2 weeks, please contact this office.

## 2015-09-15 LAB — URINE CULTURE: Organism ID, Bacteria: NO GROWTH

## 2015-09-20 ENCOUNTER — Encounter: Payer: Self-pay | Admitting: Emergency Medicine

## 2015-09-20 ENCOUNTER — Ambulatory Visit (INDEPENDENT_AMBULATORY_CARE_PROVIDER_SITE_OTHER): Payer: Medicare Other | Admitting: Emergency Medicine

## 2015-09-20 VITALS — BP 110/78 | HR 75 | Temp 98.2°F | Resp 16 | Ht 60.5 in | Wt 131.4 lb

## 2015-09-20 DIAGNOSIS — R7309 Other abnormal glucose: Secondary | ICD-10-CM | POA: Diagnosis not present

## 2015-09-20 DIAGNOSIS — I10 Essential (primary) hypertension: Secondary | ICD-10-CM | POA: Diagnosis not present

## 2015-09-20 DIAGNOSIS — R42 Dizziness and giddiness: Secondary | ICD-10-CM | POA: Diagnosis not present

## 2015-09-20 DIAGNOSIS — H0260 Xanthelasma of unspecified eye, unspecified eyelid: Secondary | ICD-10-CM | POA: Insufficient documentation

## 2015-09-20 DIAGNOSIS — E785 Hyperlipidemia, unspecified: Secondary | ICD-10-CM | POA: Diagnosis not present

## 2015-09-20 DIAGNOSIS — R739 Hyperglycemia, unspecified: Secondary | ICD-10-CM

## 2015-09-20 LAB — POCT GLYCOSYLATED HEMOGLOBIN (HGB A1C): Hemoglobin A1C: 6

## 2015-09-20 LAB — GLUCOSE, POCT (MANUAL RESULT ENTRY): POC Glucose: 94 mg/dl (ref 70–99)

## 2015-09-20 LAB — LIPID PANEL
CHOL/HDL RATIO: 8.3 ratio — AB (ref <=5.0)
Cholesterol: 455 mg/dL — ABNORMAL HIGH (ref 125–200)
HDL: 55 mg/dL (ref >=46)
LDL Cholesterol: 355 mg/dL — ABNORMAL HIGH (ref <130)
Triglycerides: 224 mg/dL — ABNORMAL HIGH (ref <150)
VLDL: 45 mg/dL — AB (ref <30)

## 2015-09-20 NOTE — Progress Notes (Signed)
Patient ID: Becky Turner, female   DOB: 1945/05/17, 70 y.o.   MRN: [id number]    By signing my name below, I, Essence Howell, attest that this documentation has been prepared under the direction and in the presence of Collene GobbleSteven A Luisfernando Brightwell, MD Electronically Signed: Charline BillsEssence Howell, ED Scribe 09/20/2015 at 11:56 AM.  Chief Complaint:  Chief Complaint  Patient presents with  . Follow-up   HPI: Becky NabBich Resler is a 70 y.o. female who [facility] follow[facility]. Pt was seen in the office 6 days ago for elevated BP following a hospital visit for dizziness with elevated BP 6 days prior. Triage BP: 110/78. Pt states she is feeling better overall with decreased dizziness but still has some symptoms.   Difficulty Sleeping Pt states that she only sleeps for 3-4 hours at at time. Her bedtime is around 9 PM but she wakes up around 3 or 4 AM. She has never tried sleep aids; states she is afraid of side effects of dizziness.   Pt's native language is vietnamese; her daughter translated for her in the room.   No past medical history on file. No past surgical history on file. Social History   Social History  . Marital status: Married    Spouse name: N/A  . Number of children: N/A  . Years of education: N/A   Social History Main Topics  . Smoking status: Never Smoker  . Smokeless tobacco: Never Used  . Alcohol use No  . Drug use: No  . Sexual activity: Not on file   Other Topics Concern  . Not on file   Social History Narrative  . No narrative on file   No family history on file. Allergies  Allergen Reactions  . Aspirin Nausea And Vomiting   Prior to Admission medications   Medication Sig Start Date End Date Taking? Authorizing Provider  hydrochlorothiazide (HYDRODIURIL) 12.5 MG tablet Take 1 tablet (12.5 mg total) by mouth daily. 09/10/15  Yes Alicia B Chitanand, DO  losartan (COZAAR) 25 MG tablet Take 1 tablet (25 mg total) by mouth daily. 09/14/15  Yes Collene GobbleSteven A Jacory Kamel, MD  metoprolol succinate  (TOPROL-XL) 25 MG 24 hr tablet Take 25 mg by mouth daily.   Yes Collene GobbleSteven A Shawna Kiener, MD  Melatonin 5 MG TABS Take 1 tablet (5 mg total) by mouth at bedtime as needed (insomnia). Patient not taking: Reported on 09/20/2015 09/10/15   Helmut MusterAlicia B Chitanand, DO   ROS: The patient denies fevers, chills, night sweats, unintentional weight loss, chest pain, palpitations, wheezing, dyspnea on exertion, nausea, vomiting, abdominal pain, dysuria, hematuria, melena, numbness, weakness, or tingling.   All other systems have been reviewed and were otherwise negative with the exception of those mentioned in the HPI and as above.    PHYSICAL EXAM: Vitals:   09/20/15 1123  BP: 110/78  Pulse: 75  Resp: 16  Temp: 98.2 F (36.8 C)   Body mass index is 25.24 kg/m.  General: Alert, no acute distress.  HEENT:  Normocephalic, atraumatic, oropharynx patent. Xanthelasma over both eyelids.  [facility]OMI[facility] Cardiovascular:  Regular rate and rhythm, no rubs murmurs or gallops.  No Carotid bruits, radial pulse intact. No pedal edema.  Respiratory: Clear to auscultation bilaterally.  No wheezes, rales, or rhonchi.  No cyanosis, no use of accessory musculature Abdominal: No organomegaly, abdomen is soft and non-tender, positive bowel sounds.  No masses. Musculoskeletal: Gait intact. No edema, tenderness. LE reflexes are 2+.  Skin: No rashes. Neurologic: Facial  musculature symmetric. Psychiatric: Patient acts appropriately throughout our interaction. Lymphatic: No cervical or submandibular lymphadenopathy  LABS:  EKG/XRAY:   Primary read interpreted by Dr. Cleta Alberts at [facility].  ASSESSMENT/PLAN:  They did not want to start on cholesterol medicine but wanted to wait on her blood test. Blood pressure is now excellent. She is going to take chamomile tea at night and continue Toprol-XL 25 one a day for blood pressure.I personally performed the services described in this documentation, which was scribed in my presence. The  recorded information has been reviewed and is accurate.  Gross sideeffects, risk and benefits, and alternatives of medications d/w patient. Patient is aware that all medications have potential sideeffects and we are unable to predict every sideeffect or drug-drug interaction that may occur.  Lesle Chris MD 09/20/2015 11:40 AM

## 2015-09-20 NOTE — Patient Instructions (Signed)
     IF you received an x-ray today, you will receive an invoice from [facility]. Please contact [facility] at [phone] with questions or concerns regarding your invoice.   IF you received labwork today, you will receive an invoice from [facility]. Please contact [facility] at [phone] with questions or concerns regarding your invoice.   Our billing staff will not be able to assist you with questions regarding bills from these companies.  You will be contacted with the lab results as soon as they are available. The fastest way to get your results is to activate your My Chart account. Instructions are located on the last page of this paperwork. If you have not heard from us regarding the results in 2 weeks, please contact this office.      

## 2015-09-21 ENCOUNTER — Telehealth: Payer: Self-pay | Admitting: *Deleted

## 2015-09-21 ENCOUNTER — Other Ambulatory Visit: Payer: Self-pay | Admitting: *Deleted

## 2015-09-21 MED ORDER — ROSUVASTATIN CALCIUM 10 MG PO TABS: ORAL_TABLET | ORAL | 11 refills | Status: DC

## 2015-10-05 ENCOUNTER — Ambulatory Visit (INDEPENDENT_AMBULATORY_CARE_PROVIDER_SITE_OTHER): Payer: Medicare Other | Admitting: Family Medicine

## 2015-10-05 VITALS — BP 122/72 | HR 66 | Temp 98.2°F | Resp 17 | Ht 60.5 in | Wt 127.0 lb

## 2015-10-05 DIAGNOSIS — R0789 Other chest pain: Secondary | ICD-10-CM

## 2015-10-05 DIAGNOSIS — E785 Hyperlipidemia, unspecified: Secondary | ICD-10-CM | POA: Diagnosis not present

## 2015-10-05 DIAGNOSIS — H8111 Benign paroxysmal vertigo, right ear: Secondary | ICD-10-CM

## 2015-10-05 DIAGNOSIS — R0989 Other specified symptoms and signs involving the circulatory and respiratory systems: Secondary | ICD-10-CM

## 2015-10-05 DIAGNOSIS — I10 Essential (primary) hypertension: Secondary | ICD-10-CM | POA: Diagnosis not present

## 2015-10-05 MED ORDER — ONDANSETRON 4 MG PO TBDP
4.0000 mg | ORAL_TABLET | Freq: Once | ORAL | Status: AC
  Administered 2015-10-05: 4 mg via ORAL

## 2015-10-05 MED ORDER — ONDANSETRON HCL 4 MG PO TABS: 4.0000 mg | ORAL_TABLET | Freq: Two times a day (BID) | ORAL | 3 refills | Status: DC

## 2015-10-05 NOTE — Patient Instructions (Addendum)
IF you received an x-ray today, you will receive an invoice from [facility]. Please contact [facility] at [phone] with questions or concerns regarding your invoice.   IF you received labwork today, you will receive an invoice from [facility]. Please contact [facility] at [phone] with questions or concerns regarding your invoice.   Our billing staff will not be able to assist you with questions regarding bills from these companies.  You will be contacted with the lab results as soon as they are available. The fastest way to get your results is to activate your My Chart account. Instructions are located on the last page of this paperwork. If you have not heard from [place] regarding the results in 2 weeks, please contact this office.     Ch?ng Chng M?t (Vertigo) Chng m?t c ngh?a l b?n c?m th?y nh? b?n hay m?i th? xung quanh b?n ?ang di chuy?n khi th?c t? khng ph?i nh? v?y. Chng m?t c th? nguy hi?m n?u n x?y ra khi b?n ?ang ? n?i lm vi?c, li xe ho?c th?c hi?n cc ho?t ??ng kh kh?n. NGUYN NHN Chng m?t x?y ra khi c s? xung ??t c?a cc tn hi?u ???c g?i ln no t? h? th?ng th? gic v h? th?ng c?m gic trong c? th?. C nhi?u nguyn nhn khc nhau gy chng m?t, bao g?m:  Nhi?m trng, ??c bi?t l ? tai trong.  Ph?n ?ng x?u v?i m?t lo?i thu?c ho?c l?m d?ng r??u v thu?c men.  Cai ma ty ho?c r??u.  Thay ??i t? th? nhanh, ch?ng h?n nh? n?m xu?ng ho?c l?n mnh trn gi??ng.  ?au n?a ??u.  L?u l??ng mu ln no gi?m.  p l?c trong no t?ng sau ch?n th??ng ??u, nhi?m trng, kh?i u ho?c ch?y mu. TRI?U CH?NG B?n c th? c?m th?y c v? nh? l th? gi?i ?ang quay cu?ng ho?c b?n ?ang r?i xu?ng ??t. B?i v s? cn b?ng c?a b?n b? ??o l?n, chng m?t c th? khi?n b?n bu?n nn v nn m?a. B?n c th? c c? ??ng m?t ngoi  mu?n (gi?t c?u m?t). CH?N ?ON Chng m?t th??ng ???c ch?n ?on b?ng cch khm th?c th?. N?u khng xc ??nh ???c  nguyn nhn gy chng m?t, chuyn gia ch?m [facility] s?c kh?e c th? th?c hi?n cc xt nghi?m hnh ?nh, ch?ng h?n nh? ch?p MRI (ch?p c?ng h??ng t?). ?I?U TR? H?u h?t cc tr??ng h?p chng m?t t? chng h?t m khng c?n ?i?u tr?Marland Kitchen Ty thu?c vo nguyn nhn, chuyn gia ch?m [place] s?c kh?e c th? k m?t s? thu?c nh?t ??nh. N?u chng m?t c lin quan ??n cc v?n ?? v? v? tr c? th?, chuyn gia ch?m [place] s?c kh?e c th? ?? xu?t cc ??ng tc ho?c ph??ng php ?? kh?c ph?c v?n ?? ny. Trong m?t s? tr??ng h?p hi?m g?p, n?u chng m?t do m?t s? v?n ?? v? tai trong, b?n c th? c?n ph?u thu?t. H??NG D?N CH?M Warner T?I NH  Lm theo h??ng d?n c?a bc s?Hessie Diener li xe.  Trnh v?n hnh my mc n?ng.  Trnh th?c hi?n b?t c? nhi?m v? no c th? gy nguy hi?m cho b?n ho?c nh?ng ng??i khc trong tnh tr?ng chng m?t.  Ni cho chuyn gia ch?m [place] s?c kh?e bi?t n?u b?n nh?n th?y lo?i thu?c no ? d??ng nh? l nguyn nhn gy chng m?t cho b?n. M?t s? thu?c ???c dng ?? ?i?u tr? tnh  tr?ng chng m?t th?c s? c th? lm cho chng t?i t? h?n ? m?t s? ng??i. HY NGAY L?P T?C ?I KHM N?U:  Thu?c c v? nh? khng c tc d?ng lm h?t c?n chng m?t ho?c lm b?nh n?ng thm.  B?n c v?n ?? v? ni chuy?n, ?i b?, ?m y?u ho?c vi?c s? d?ng cnh tay, bn tay hay chn.  B?n b? ?au ??u r?t nhi?u.  Bu?n nn ho?c nn lin t?c ho?c n?ng h?n.  B?n b? thay ??i th? gic.  Thnh vin trong gia ?nh nh?n th?y cc thay ??i hnh vi.  Tnh tr?ng c?a b?n tr? nn n?ng h?n. ??M B?O B?N:  Hi?u cc h??ng d?n ny.  S? theo di tnh tr?ng c?a mnh.  S? yu c?u tr? gip ngay l?p t?c n?u b?n c?m th?y khng ?? ho?c tnh tr?ng tr?m tr?ng h?n.   Thng tin ny khng nh?m m?c ?ch thay th? cho l?i khuyn m chuyn gia ch?m [place] s?c kh?e ni v?i qu v?. Hy b?o ??m qu v? ph?i th?o lu?n b?t k? v?n ?? g m qu v? c v?i chuyn gia ch?m [place] s?c kh?e c?a qu v?.   Document Released: 01/26/2005 Document Revised: 09/28/2012 Elsevier Interactive Patient  Education 2016 [employer]. [employer] Self-Care WHAT IS THE EPLEY MANEUVER? The Epley maneuver is an exercise you can do to relieve symptoms of benign paroxysmal positional vertigo (BPPV). This condition is often just referred to as vertigo. BPPV is caused by the movement of tiny crystals (canaliths) inside your inner ear. The accumulation and movement of canaliths in your inner ear causes a sudden spinning sensation (vertigo) when you move your head to certain positions. Vertigo usually lasts about 30 seconds. BPPV usually occurs in just one ear. If you get vertigo when you lie on your left side, you probably have BPPV in your left ear. Your health care provider can tell you which ear is involved.  BPPV may be caused by a head injury. Many people older than 50 get BPPV for unknown reasons. If you have been diagnosed with BPPV, your health care provider may teach you how to do this maneuver. BPPV is not life threatening (benign) and usually goes away in time.  WHEN SHOULD I PERFORM THE EPLEY MANEUVER? You can do this maneuver at home whenever you have symptoms of vertigo. You may do the Epley maneuver up to 3 times a day until your symptoms of vertigo go away. HOW SHOULD I DO THE EPLEY MANEUVER? 1. Sit on the edge of a bed or table with your back straight. Your legs should be extended or hanging over the edge of the bed or table.  2. Turn your head halfway toward the affected ear.  3. Lie backward quickly with your head turned until you are lying flat on your back. You may want to position a pillow under your shoulders.  4. Hold this position for 30 seconds. You may experience an attack of vertigo. This is normal. Hold this position until the vertigo stops. 5. Then turn your head to the opposite direction until your unaffected ear is facing the floor.  6. Hold this position for 30 seconds. You may experience an attack of vertigo. This is normal. Hold this position until the vertigo  stops. 7. Now turn your whole body to the same side as your head. Hold for another 30 seconds.  8. You can then sit back up. ARE THERE RISKS TO THIS MANEUVER? In some cases, you  may have other symptoms (such as changes in your vision, weakness, or numbness). If you have these symptoms, stop doing the maneuver and call your health care provider. Even if doing these maneuvers relieves your vertigo, you may still have dizziness. Dizziness is the sensation of light-headedness but without the sensation of movement. Even though the Epley maneuver may relieve your vertigo, it is possible that your symptoms will return within 5 years. WHAT SHOULD I DO AFTER THIS MANEUVER? After doing the Epley maneuver, you can return to your normal activities. Ask your doctor if there is anything you should do at home to prevent vertigo. This may include:  Sleeping with two or more pillows to keep your head elevated.  Not sleeping on the side of your affected ear.  Getting up slowly from bed.  Avoiding sudden movements during the day.  Avoiding extreme head movement, like looking up or bending over.  Wearing a cervical collar to prevent sudden head movements. WHAT SHOULD I DO IF MY SYMPTOMS GET WORSE? Call your health care provider if your vertigo gets worse. Call your provider right way if you have other symptoms, including:   Nausea.  Vomiting.  Headache.  Weakness.  Numbness.  Vision changes.   This information is not intended to replace advice given to you by your health care provider. Make sure you discuss any questions you have with your health care provider.   Document Released: 01/31/2013 Document Reviewed: 01/31/2013 Elsevier Interactive Patient Education [employer].

## 2015-10-07 NOTE — Progress Notes (Signed)
By signing my name below I, Shelah Lewandowsky, attest that this documentation has been prepared under the direction and in the presence of Norberto Sorenson, MD. Electonically Signed. Shelah Lewandowsky, Scribe 10/07/2015 at 11:43 AM  Subjective:    Patient ID: Becky Turner, female    DOB: 06-Dec-1945, 70 y.o.   MRN: [id number]  Chief Complaint  Patient presents with  . Medication Problem    dizziness from medication     HPI Becky Turner is a 70 y.o. female who presents to the [facility] complaining of dizziness that pt thinks is due to her medication. She is here with her daughter who provides the history - they both speak [place] Pt has been seen and evaluated at [facility] for dizziness on 09/20/15 and 09/14/15. Symptoms started initially a month ago when pt was evaluated at the ED for acute onset dizziness that developed upon waking. Pt was HTN at 178/70, Code stroke called, CT and MRI were NAD. Neurology evaluated pt in the ED. Pt evaluated with BPPV and was started on meclozine, which symptoms responded to. Pt seen at [facility] the following day and started on low dose HCTZ, continued on meclozine, started on melatonin for insomnia. Pt returned to [facility] on 09/14/15 and pt's BP was still in the 160-170/100s on HCTZ, so pt stopped HCTZ and pt transitioned to losartan 25, because pt did not feel well on HCTZ with increased urinary frequency, abd pain, diarrhea, and numbness in feet. At some point pt's losartan was changed to metoprolol 25. Follow up one week later showed pt's BP was well controlled. Pt was advised to continue metoprolol. Lipid panel was elevated at that visit, so pt started on crestor 10mg  at night. Pt's A1C was 6.0 at that time.  Pt also reports HA. Pt also repots that she has been having chest tightness that started when pt started the crestor 2 weeks ago. Chest tightness was getting worse until she stopped taking crestor 4 days ago. Chest tightness is improving since stopping the crestor. Chest  tightness has been coming and going. Pt reports Chest tightness, HA, and dizziness always occur together. During episode of symptoms, pt can take a deep breath which improves her symptoms. Chest tightness radiates into both shoulders. Pt denies back pain or tightness. Pt gets dizzy whenever she walks.   Pt's native language is vietnamese; her daughter translated for her in the room.    Patient Active Problem List   Diagnosis Date Noted  . 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 Essential hypertension 09/10/2015  . Dizziness and giddiness 09/10/2015  . Insomnia 09/10/2015  . Elevated blood sugar 09/10/2015    Current Outpatient Prescriptions on File Prior to Visit  Medication Sig Dispense Refill  . metoprolol succinate (TOPROL-XL) 25 MG 24 hr tablet Take 25 mg by mouth daily.    . rosuvastatin (CRESTOR) 10 MG tablet Take once a day at night (Patient not taking: Reported on 10/05/2015) 30 tablet 11   No current facility-administered medications on file prior to visit.     Allergies  Allergen Reactions  . Aspirin Nausea And Vomiting    Depression screen [facility] 2/9 10/05/2015 09/20/2015 09/14/2015 09/10/2015 09/04/2014  Decreased Interest 0 0 0 0 0  Down, Depressed, Hopeless 0 0 0 0 0  PHQ - 2 Score 0 0 0 0 0       Review of Systems  Constitutional: Negative for fever.  HENT: Negative for congestion.   Eyes: Negative for visual disturbance.  Respiratory: Positive for chest tightness.   Gastrointestinal: Negative for abdominal pain.  Genitourinary: Negative for dysuria.  Musculoskeletal: Negative for back pain.  Skin: Negative for rash.  Neurological: Positive for dizziness and headaches.  Psychiatric/Behavioral: Negative for behavioral problems.       Objective:   Physical Exam  Constitutional: She is oriented to person, place, and time. She appears well-developed and well-nourished. No distress.  HENT:  Head: Normocephalic and atraumatic.  Right Ear: Hearing, tympanic membrane,  external ear and ear canal normal.  Left Ear: Hearing, tympanic membrane, external ear and ear canal normal.  Nose: Nose normal.  Mouth/Throat: Oropharynx is clear and moist and mucous membranes are normal. No oropharyngeal exudate or posterior oropharyngeal erythema.  Eyes: Conjunctivae are normal. Pupils are equal, round, and reactive to light.  Neck: Neck supple. No thyromegaly present.  Cardiovascular: Normal rate and regular rhythm.  Exam reveals no gallop and no friction rub.   Murmur heard.  Diastolic murmur is present with a grade of 2/6  Pulmonary/Chest: Effort normal and breath sounds normal. No accessory muscle usage. No respiratory distress. She has no decreased breath sounds. She has no wheezes. She has no rhonchi. She has no rales.  Musculoskeletal: Normal range of motion.  Lymphadenopathy:    She has no cervical adenopathy.  Neurological: She is alert and oriented to person, place, and time.  Positive dix-hallpike on rt Negative dix-hallpike on left  Skin: Skin is warm and dry.  Psychiatric: She has a normal mood and affect. Her behavior is normal.  Nursing note and vitals reviewed.  Note pt denied having chest tightness when she had positive dix hallpike  BP 122/72 (BP Location: Right Arm, Patient Position: Sitting, Cuff Size: Normal)   Pulse 66   Temp 98.2 F (36.8 C) (Oral)   Resp 17   Ht 5' 0.5" (1.537 m)   Wt 127 lb (57.6 kg)   SpO2 96%   BMI 24.39 kg/m     EKG viewed and interpreted by Dr Clelia CroftShaw, poor baseline with normal sinus rhythm, general signs of strain, with question of RBBB, no acute abnormality or change from prior on 09/14/15.      Assessment & Plan:   1. Chest tightness - w/ abnml EKG. Sxs are atypical but due to language barrier leading to sub-optimal medical compliance and understanding, I would like her to be evaluated by a cardiologist. Pt and daughter agreeable  2. Labile hypertension - seems to be doing well on toprol 25  3. Hyperlipidemia -  pt and her daughter are quite convinced that her sxs are due to crestor. Explained sev times that the BPPV might periodically flair and that the time of the current flair was coincidental. However, they are not buying this so advised fine to stop for now but not to discard and will readdress in the future after BPPV has resolved.   4. BPPV (benign paroxysmal positional vertigo), right - practiced Epley manuvers for the right in the office and sev handouts given - 1 written in [place].  Sxs somewhat improved on meclizine 12.5 qid but caused sedation - gave zofran in office which did sig help sxs so will try that at home instead.    Orders Placed This Encounter  Procedures  . Ambulatory referral to Cardiology    Referral Priority:   Urgent    Referral Type:   Consultation    Referral Reason:   Specialty Services Required    Requested Specialty:   Cardiology  Number of Visits Requested:   1  . EKG 12-Lead    Meds ordered this encounter  Medications  . ondansetron (ZOFRAN-ODT) disintegrating tablet 4 mg  . ondansetron (ZOFRAN) 4 MG tablet    Sig: Take 1 tablet (4 mg total) by mouth 2 (two) times daily.    Dispense:  20 tablet    Refill:  3    I personally performed the services described in this documentation, which was scribed in my presence. The recorded information has been reviewed and considered, and addended by me as needed.   Norberto Sorenson, M.D.  [facility] [address] [place], [place] [address] [phone] phone [phone] fax  10/07/15 8:21 AM

## 2015-10-22 DIAGNOSIS — E784 Other hyperlipidemia: Secondary | ICD-10-CM | POA: Diagnosis not present

## 2015-10-22 DIAGNOSIS — R42 Dizziness and giddiness: Secondary | ICD-10-CM | POA: Diagnosis not present

## 2015-10-22 DIAGNOSIS — I10 Essential (primary) hypertension: Secondary | ICD-10-CM | POA: Diagnosis not present

## 2015-10-22 DIAGNOSIS — I209 Angina pectoris, unspecified: Secondary | ICD-10-CM | POA: Diagnosis not present

## 2015-11-01 DIAGNOSIS — I10 Essential (primary) hypertension: Secondary | ICD-10-CM | POA: Diagnosis not present

## 2015-11-01 DIAGNOSIS — R0789 Other chest pain: Secondary | ICD-10-CM | POA: Diagnosis not present

## 2015-11-08 DIAGNOSIS — E784 Other hyperlipidemia: Secondary | ICD-10-CM | POA: Diagnosis not present

## 2015-11-08 DIAGNOSIS — I10 Essential (primary) hypertension: Secondary | ICD-10-CM | POA: Diagnosis not present

## 2015-11-08 DIAGNOSIS — I209 Angina pectoris, unspecified: Secondary | ICD-10-CM | POA: Diagnosis not present

## 2015-11-08 DIAGNOSIS — R42 Dizziness and giddiness: Secondary | ICD-10-CM | POA: Diagnosis not present

## 2015-11-27 DIAGNOSIS — E784 Other hyperlipidemia: Secondary | ICD-10-CM | POA: Diagnosis not present

## 2015-11-27 DIAGNOSIS — R739 Hyperglycemia, unspecified: Secondary | ICD-10-CM | POA: Diagnosis not present

## 2015-11-27 DIAGNOSIS — I209 Angina pectoris, unspecified: Secondary | ICD-10-CM | POA: Diagnosis not present

## 2015-11-27 DIAGNOSIS — I10 Essential (primary) hypertension: Secondary | ICD-10-CM | POA: Diagnosis not present

## 2015-12-12 DIAGNOSIS — E784 Other hyperlipidemia: Secondary | ICD-10-CM | POA: Diagnosis not present

## 2015-12-12 DIAGNOSIS — I209 Angina pectoris, unspecified: Secondary | ICD-10-CM | POA: Diagnosis not present

## 2015-12-12 DIAGNOSIS — R739 Hyperglycemia, unspecified: Secondary | ICD-10-CM | POA: Diagnosis not present

## 2015-12-12 DIAGNOSIS — I10 Essential (primary) hypertension: Secondary | ICD-10-CM | POA: Diagnosis not present

## 2015-12-26 DIAGNOSIS — R739 Hyperglycemia, unspecified: Secondary | ICD-10-CM | POA: Diagnosis not present

## 2015-12-26 DIAGNOSIS — E784 Other hyperlipidemia: Secondary | ICD-10-CM | POA: Diagnosis not present

## 2015-12-26 DIAGNOSIS — I10 Essential (primary) hypertension: Secondary | ICD-10-CM | POA: Diagnosis not present

## 2015-12-26 DIAGNOSIS — I209 Angina pectoris, unspecified: Secondary | ICD-10-CM | POA: Diagnosis not present

## 2016-01-09 DIAGNOSIS — E784 Other hyperlipidemia: Secondary | ICD-10-CM | POA: Diagnosis not present

## 2016-01-09 DIAGNOSIS — E739 Lactose intolerance, unspecified: Secondary | ICD-10-CM | POA: Diagnosis not present

## 2016-01-09 DIAGNOSIS — I10 Essential (primary) hypertension: Secondary | ICD-10-CM | POA: Diagnosis not present

## 2016-01-09 DIAGNOSIS — I209 Angina pectoris, unspecified: Secondary | ICD-10-CM | POA: Diagnosis not present

## 2016-01-27 DIAGNOSIS — E784 Other hyperlipidemia: Secondary | ICD-10-CM | POA: Diagnosis not present

## 2016-01-27 DIAGNOSIS — I209 Angina pectoris, unspecified: Secondary | ICD-10-CM | POA: Diagnosis not present

## 2016-01-27 DIAGNOSIS — R739 Hyperglycemia, unspecified: Secondary | ICD-10-CM | POA: Diagnosis not present

## 2016-01-27 DIAGNOSIS — I10 Essential (primary) hypertension: Secondary | ICD-10-CM | POA: Diagnosis not present

## 2016-02-12 DIAGNOSIS — I209 Angina pectoris, unspecified: Secondary | ICD-10-CM | POA: Diagnosis not present

## 2016-02-12 DIAGNOSIS — I10 Essential (primary) hypertension: Secondary | ICD-10-CM | POA: Diagnosis not present

## 2016-02-12 DIAGNOSIS — E784 Other hyperlipidemia: Secondary | ICD-10-CM | POA: Diagnosis not present

## 2016-02-12 DIAGNOSIS — R739 Hyperglycemia, unspecified: Secondary | ICD-10-CM | POA: Diagnosis not present

## 2016-03-04 DIAGNOSIS — I10 Essential (primary) hypertension: Secondary | ICD-10-CM | POA: Diagnosis not present

## 2016-03-04 DIAGNOSIS — R739 Hyperglycemia, unspecified: Secondary | ICD-10-CM | POA: Diagnosis not present

## 2016-03-04 DIAGNOSIS — I209 Angina pectoris, unspecified: Secondary | ICD-10-CM | POA: Diagnosis not present

## 2016-03-04 DIAGNOSIS — E784 Other hyperlipidemia: Secondary | ICD-10-CM | POA: Diagnosis not present

## 2016-04-01 DIAGNOSIS — E784 Other hyperlipidemia: Secondary | ICD-10-CM | POA: Diagnosis not present

## 2016-04-01 DIAGNOSIS — I951 Orthostatic hypotension: Secondary | ICD-10-CM | POA: Diagnosis not present

## 2016-04-01 DIAGNOSIS — I209 Angina pectoris, unspecified: Secondary | ICD-10-CM | POA: Diagnosis not present

## 2016-04-01 DIAGNOSIS — I10 Essential (primary) hypertension: Secondary | ICD-10-CM | POA: Diagnosis not present

## 2016-04-08 DIAGNOSIS — I10 Essential (primary) hypertension: Secondary | ICD-10-CM

## 2016-04-27 DIAGNOSIS — E784 Other hyperlipidemia: Secondary | ICD-10-CM | POA: Diagnosis not present

## 2016-05-01 DIAGNOSIS — I209 Angina pectoris, unspecified: Secondary | ICD-10-CM | POA: Diagnosis not present

## 2016-05-01 DIAGNOSIS — E784 Other hyperlipidemia: Secondary | ICD-10-CM | POA: Diagnosis not present

## 2016-05-01 DIAGNOSIS — I951 Orthostatic hypotension: Secondary | ICD-10-CM | POA: Diagnosis not present

## 2016-05-01 DIAGNOSIS — I10 Essential (primary) hypertension: Secondary | ICD-10-CM | POA: Diagnosis not present

## 2016-06-16 DIAGNOSIS — I209 Angina pectoris, unspecified: Secondary | ICD-10-CM | POA: Diagnosis not present

## 2016-06-16 DIAGNOSIS — E784 Other hyperlipidemia: Secondary | ICD-10-CM | POA: Diagnosis not present

## 2016-06-16 DIAGNOSIS — I10 Essential (primary) hypertension: Secondary | ICD-10-CM | POA: Diagnosis not present

## 2016-06-25 DIAGNOSIS — E784 Other hyperlipidemia: Secondary | ICD-10-CM | POA: Diagnosis not present

## 2016-08-10 NOTE — Progress Notes (Unsigned)
[facility] Provider: Pamella PertJagadeesh R. Ganji MD , 06/16/2016  Assessment & Plan  Angina pectoris- status is resolved (413.9  I20.0)  Labwork 04/27/2016: Cholesterol 303, Triglycerides 185, HDL 68. LDL 198  Impression & Plan She also has orthostatic hypotension however she has not been symptomatic and hence no changes in the medications were done today. She is tolerating Crestor 10 mg and has mild arthralgias.

## 2016-08-17 DIAGNOSIS — E784 Other hyperlipidemia: Secondary | ICD-10-CM | POA: Diagnosis not present

## 2016-09-02 DIAGNOSIS — E784 Other hyperlipidemia: Secondary | ICD-10-CM | POA: Diagnosis not present

## 2016-09-16 DIAGNOSIS — E784 Other hyperlipidemia: Secondary | ICD-10-CM | POA: Diagnosis not present

## 2016-09-30 DIAGNOSIS — E784 Other hyperlipidemia: Secondary | ICD-10-CM | POA: Diagnosis not present

## 2016-10-03 ENCOUNTER — Encounter: Payer: Self-pay | Admitting: Family Medicine

## 2016-10-03 ENCOUNTER — Ambulatory Visit (INDEPENDENT_AMBULATORY_CARE_PROVIDER_SITE_OTHER): Payer: Medicare Other | Admitting: Family Medicine

## 2016-10-03 VITALS — BP 132/70 | HR 69 | Temp 97.7°F | Resp 17 | Ht 60.5 in | Wt 139.8 lb

## 2016-10-03 DIAGNOSIS — L509 Urticaria, unspecified: Secondary | ICD-10-CM

## 2016-10-03 MED ORDER — HYDROXYZINE HCL 10 MG PO TABS: 10.0000 mg | ORAL_TABLET | Freq: Three times a day (TID) | ORAL | 1 refills | Status: DC | PRN

## 2016-10-03 MED ORDER — HYDROCORTISONE 2.5 % EX CREA: TOPICAL_CREAM | Freq: Two times a day (BID) | CUTANEOUS | 0 refills | Status: DC

## 2016-10-03 NOTE — Progress Notes (Signed)
  Chief Complaint  Patient presents with  . whole body    itching all over x 2 weeks, started on back and now itches all over, ? if it is a side effect to some of the medications.    HPI  Pt here with her daughter who is translating She is itchy all over her back, chest, arms and legs with a hyperpigmented diffuse rash on her upper back.  Denies rash on hands, face, mouth or in groin.  History reviewed. No pertinent past medical history.  Current Outpatient Prescriptions  Medication Sig Dispense Refill  . Alirocumab (PRALUENT) 150 MG/ML SOPN Inject 150 mg into the skin every 14 (fourteen) days.    Marland Kitchen amLODipine (NORVASC) 5 MG tablet Take 5 mg by mouth daily.    Marland Kitchen lisinopril-hydrochlorothiazide (PRINZIDE,ZESTORETIC) 20-25 MG tablet Take 1 tablet by mouth daily.    . metoprolol tartrate (LOPRESSOR) 50 MG tablet Take 50 mg by mouth once.    . rosuvastatin (CRESTOR) 10 MG tablet Take once a day at night 30 tablet 11  . hydrOXYzine (ATARAX/VISTARIL) 10 MG tablet Take 1 tablet (10 mg total) by mouth 3 (three) times daily as needed for itching. 90 tablet 1  . metoprolol succinate (TOPROL-XL) 25 MG 24 hr tablet Take 25 mg by mouth daily.    . ondansetron (ZOFRAN) 4 MG tablet Take 1 tablet (4 mg total) by mouth 2 (two) times daily. (Patient not taking: Reported on 10/03/2016) 20 tablet 3   No current facility-administered medications for this visit.     Allergies:  Allergies  Allergen Reactions  . Aspirin Nausea And Vomiting    History reviewed. No pertinent surgical history.  Social History   Social History  . Marital status: Married    Spouse name: N/A  . Number of children: N/A  . Years of education: N/A   Social History Main Topics  . Smoking status: Never Smoker  . Smokeless tobacco: Never Used  . Alcohol use No  . Drug use: No  . Sexual activity: Not Asked   Other Topics Concern  . None   Social History Narrative  . None    ROS  Objective: Vitals:   10/03/16  1137  BP: 132/70  Pulse: 69  Resp: 17  Temp: 97.7 F (36.5 C)  TempSrc: Oral  SpO2: 96%  Weight: 139 lb 12.8 oz (63.4 kg)  Height: 5' 0.5" (1.537 m)    Physical Exam General: alert, oriented, in NAD Head: normocephalic, atraumatic, no sinus tenderness Eyes: EOM intact, no scleral icterus or conjunctival injection Ears: TM clear bilaterally Nose: mucosa nonerythematous, nonedematous Throat: no pharyngeal exudate or erythema Lymph: no posterior auricular, submental or cervical lymph adenopathy Heart: normal rate, normal sinus rhythm, no murmurs Lungs: clear to auscultation bilaterally, no wheezing Skin: flat hyperpigmentation in the upper back, no rash or vesicle on arms or legs  Assessment and Plan Taronda was seen today for whole body.  Diagnoses and all orders for this visit:  Urticaria-  Advised hydrocortisone and hydroxyzine -     hydrOXYzine (ATARAX/VISTARIL) 10 MG tablet; Take 1 tablet (10 mg total) by mouth 3 (three) times daily as needed for itching.     Denaja Verhoeven A Kathyrn Warmuth

## 2016-10-03 NOTE — Patient Instructions (Addendum)
IF you received an x-ray today, you will receive an invoice from [facility]. Please contact [facility] at [phone] with questions or concerns regarding your invoice.   IF you received labwork today, you will receive an invoice from [place]. Please contact [facility] at [phone] with questions or concerns regarding your invoice.   Our billing staff will not be able to assist you with questions regarding bills from these companies.  You will be contacted with the lab results as soon as they are available. The fastest way to get your results is to activate your My Chart account. Instructions are located on the last page of this paperwork. If you have not heard from [place] regarding the results in 2 weeks, please contact this office.     Pht ban (Hives) Pha?t ban (ma?y ?ay) la? nh??ng ch? ng??a, ?o? va? s?ng trn da quy? vi?. Pha?t ban co? th? xu?t hi?n trn b?t ky? b? ph?n na?o trn c? th? va? co? th? co? ki?ch th???c kha?c nhau. Chu?ng co? th? chi? nho? b??ng ??u bu?t ho??c l??n h?n nhi?u. Pha?t ban th???ng h?t trong vo?ng 24 gi?? (pha?t ban c?p ti?nh). Trong nh??ng tr???ng h??p kha?c, ca?c v?t pha?t ban m??i xu?t hi?n sau khi ca?c v?t cu? m?t ?i. Chu ky? na?y co? th? ti?p tu?c trong va?i nga?y ho??c va?i tu?n (pha?t ban ma?n ti?nh). Pha?t ban la? k?t qua? cu?a pha?n ??ng cu?a c? th? quy? vi? v??i m?t ch?t ki?ch thi?ch ho??c v??i m?t th?? ma? quy? vi? bi? di? ??ng (ta?c nhn kh??i pha?t). Khi quy? vi? ti?p xu?c v??i m?t ta?c nhn kho?i pha?t, c? th? quy? vi? gia?i pho?ng m?t ho?a ch?t (histamine) gy ?o?, ng??a va? s?ng. Quy? vi? co? th? bi? pha?t ban ngay sau khi ti?p xu?c v??i ta?c nhn kh??i pha?t ho??c sau ?o? ha?ng gi??. Pht ban khng ly t? ng??i sang ng??i (khng ly lan). Cc vng pht ban cu?a quy? vi? c th? tr?m tr?ng h?n n?u gi, t?p th? d?c v b? c?ng th?ng tinh th?n. NGUYN NHN Nh??ng nguyn nhn gy ra tnh tr?ng ny bao  g?m:  Di? ??ng v??i m?t s? loa?i th??c ?n ho??c cc tha?nh ph?n nh?t ??nh.  Cn trng c?n ho?c ??t.  Ti?p xu?c v??i ph?n hoa ho??c va?y da v?t nui.  Ti?p xc v?i cao su ho??c ha ch?t.  ?? lu d???i n??ng, no?ng ho??c la?nh (ph?i nhi?m).  T?p luy?n.  C?ng th?ng. Quy? vi? cu?ng co? th? bi? pha?t ban do m?t s? tnh tr?ng b?nh ly? va? cc bi?n php ?i?u tri?Marland Kitchen Nh?ng tnh tr?ng ny bao g?m:  Cc vi ru?t, bao g?m c? c?m l?nh thng th??ng.  Nhi?m vi khu?n, ch??ng ha?n nh? nhi?m tru?ng ????ng ti?u va? vim ho?ng do lin c?u khu?n.  Nh??ng r?i loa?n nh? vim m?ch, lupus ho?c b?nh tuy?n gip.  M?t s? loa?i thu?c nh?t ??nh.  Tim thu?c ch??a di? ??ng.  Truy?n mu. ?i khi nguyn nhn gy pha?t ban khng ????c bi?t ??n (pha?t ban t?? pha?t). CC Y?U T? NGUY C? Tnh tr?ng ny hay x?y ra h?n ?:  Ph? n?.  Nh??ng ng???i bi? di? ??ng th??c ph?m, ???c bi?t di? ??ng v??i cam quy?t, s??a, tr??ng, la?c, ca?c loa?i ha?t ho??c tm cua so? h?n.  Nh??ng ng???i bi? di? ??ng v??i: ? Thu?c. ? Cao su. ? Cn tru?ng. ? ??ng v?t. ? Ph?n hoa.  Nh??ng ng???i bi? m?t s? b?nh ly? nh?t ?i?nh, bao g?mlupus ho??c b?nh tuy?n gia?p. TRI?U CH?NG  Tri?u ch??ng chi?nh cu?a b?nh na?y la? ca?c u cu?c ho??c ma?ng ?o? ho??c tr??ng n?i ln, ng??atrn da cu?a quy? vi?. Nh??ng khu v??c na?y co? th?:  L??n ln va? s?ng (v?t s?ng).  Thay ??i kch th??c va? vi? tri? nhanh chng v l?p ?i l?p l?i.  La? pha?t ban ring r? ho??c k?t v??i nhau trn m?t vu?ng da l??n.  ?au nho?i ho??c tr?? nn ?au ???n.  Chuy?n tha?nh ma?u tr??ng khi ?n va?o gi??a (tr?ng nh??t). Trong nh??ng tr???ng h??p n?ng, tay, chn va? m??tquy? vi? cu?ng co? th? bi? s?ng. ?i?u ny c th? x?y ra n?u pht ban pht tri?n su h?n trong da. CH?N ?ON Tnh tr?ng ny c th? ???c ch?n ?on d?a vo tri?u ch??ng, khai thc b?nh s? v khm th?c th?. Da, n???c ti?u ho??c ma?u co? th? ????c xe?t nghi?m ??  ti?m nguyn nhn pha?t ban va? ?? loa?i tr?? ca?c v?n ?? s??c kho?e kha?c. Chuyn gia ch?m so?c s??c kho?e cu?ng co? th? l?y m?t m?u da nho? t?? vu?ng bi? a?nh h???ng ?? ki?m tra d???i ki?nh hi?n vi (sinh thi?t). ?I?U TR? Vi?c ?i?u tr? ty thu?c va?o m?c ?? n?ng c?a b?nh. Chuyn gia ch?m so?c s??c kho?e co? th? khuy?n nghi? s?? du?ng mi?ng va?i la?nh, ???t (b?ng e?p la?nh) ho??c t??m n???c ma?t ?? gia?m ng??a. ?i khi pha?t ban ????c ?i?u tri? b??ng thu?c, bao g?m:  Thu?c khng histamine.  Corticosteroid.  Khng sinh.  M?t loa?i thu?c tim (omalizumab). Chuyn gia ch?m so?c s??c kho?e cu?a quy? vi? co? th? k ??n thu?c na?y n?u quy? vi? bi? pha?t ban t?? pha?t ma?n ti?nh va? quy? vi? ti?p tu?c co? ca?c tri?u ch??ng ngay ca? sau khi ?i?u tri? b??ng thu?c kha?ng histamine. Nh??ng tr???ng h??p n?ng co? th? c?n tim kh?n c?p adrenaline (epinephrine) ?? tra?nh pha?n ??ng di? ??ng ?e do?a ma?ng s?ng (pha?n v?). H??NG D?N CH?M [place] T?I NH Thu?c  Ch? dng ho?c bi thu?c khng c?n k ??n v thu?c c?n k ??n theo ch? d?n c?a chuyn gia ch?m [place] s?c kh?e.  N?u qu v? ???c k thu?c khng sinh, hy dng thu?c theo ch? d?n c?a chuyn gia ch?m [place] s?c kh?e. Khng d?ng u?ng thu?c khng sinh ngay c? khi qu v? b?t ??u c?m th?y ?? h?n. Ch?m [place] da  Ch??m mt vo vng b? ?nh h??ng.  Khng gi ho?c ch xt vo da qu v?. H??ng d?n chung  Khng t??m vo?i hoa sen ho??c t??m b?n b??ng n???c no?ng. Vi?c na?y co? th? la?m ng??a nhi?u thm.  Khng m??c qu?n a?o ch?t.  S?? du?ng kem ch?ng n??ng va? qu?n a?o ba?o h? khi quy? vi? ra ngoa?i tr??i.  Trnh b?t k? ch?t no ma? c th? gy pht ban. Ghi nh?t k ?? giu?p quy? vi? theo di nh?ng g lm qu v? pht ban. Ghi l?i: ? Quy? vi? du?ng thu?c gi?Marland Kitchen Ladell Heads v? ?n v u?ng g. ? Quy? vi? s?? du?ng sa?n ph?m na?o trn da.  Tun th? t?t c? cc cu?c h?n khm l?i theo ch? d?n c?a chuyn gia ch?m [place] s?c kh?e. ?i?u ny c  vai tr quan tr?ng. ?I KHM N?U:  Tri?u ch?ng c?a qu v? khng ki?m sot ???c b?ng thu?c.  Cc kh?p c?a qu v? b? ?au ho?c s?ng.  NGAY L?P T?C ?I KHM N?U:  Qu v? b? s?t.  Qu v? b? ?au ? b?ng.  L??i ho?c mi c?a qu v? b? s?ng.  Mi? m??t cu?a  quy? vi? bi? s?ng.  Quy? vi? c?m th?y th?t ngh?t ? ng?c ho?c ? h?ng.  Qu v? b? kh th? ho?c kh nu?t. Nh?ng tri?u ch?ng ny c th? l m?t v?n ?? nghim tr?ng c?n c?p c?u. Khng ch? xem tri?u ch?ng c h?t khng. Hy ?i khm ngay l?p t?c. G?i cho d?ch v? c?p c?u t?i ??a ph??ng (911 ? [place]?). Khng t? li xe ??n b?nh vi?n. Thng tin ny khng nh?m m?c ?ch thay th? cho l?i khuyn m chuyn gia ch?m [place] s?c kh?e ni v?i qu v?. Hy b?o ??m qu v? ph?i th?o lu?n b?t k? v?n ?? g m qu v? c v?i chuyn gia ch?m [place] s?c kh?e c?a qu v?. Document Released: 01/26/2005 Document Revised: 05/20/2015 Document Reviewed: 11/14/2014 Elsevier Interactive Patient Education  2017 [employer].

## 2016-10-06 ENCOUNTER — Telehealth: Payer: Self-pay

## 2016-10-06 NOTE — Telephone Encounter (Signed)
Started PA for hydroxyzine through cover my meds.  Results should be ready for viewing within 1-3 business days, or call [phone].

## 2016-10-07 NOTE — Telephone Encounter (Signed)
Request for hydroxizine was approved through cover my meds.  Pharmacy was notified on VM.  A faxed approval will follow.

## 2016-10-20 DIAGNOSIS — E784 Other hyperlipidemia: Secondary | ICD-10-CM | POA: Diagnosis not present

## 2016-11-04 DIAGNOSIS — E784 Other hyperlipidemia: Secondary | ICD-10-CM | POA: Diagnosis not present

## 2016-11-18 DIAGNOSIS — E7849 Other hyperlipidemia: Secondary | ICD-10-CM | POA: Diagnosis not present

## 2016-12-02 DIAGNOSIS — E7849 Other hyperlipidemia: Secondary | ICD-10-CM | POA: Diagnosis not present

## 2016-12-16 DIAGNOSIS — E7849 Other hyperlipidemia: Secondary | ICD-10-CM | POA: Diagnosis not present

## 2016-12-23 DIAGNOSIS — I10 Essential (primary) hypertension: Secondary | ICD-10-CM | POA: Diagnosis not present

## 2016-12-23 DIAGNOSIS — E7849 Other hyperlipidemia: Secondary | ICD-10-CM | POA: Diagnosis not present

## 2016-12-26 DIAGNOSIS — Z23 Encounter for immunization: Secondary | ICD-10-CM | POA: Diagnosis not present

## 2017-01-05 DIAGNOSIS — E7849 Other hyperlipidemia: Secondary | ICD-10-CM | POA: Diagnosis not present

## 2017-01-19 DIAGNOSIS — E7849 Other hyperlipidemia: Secondary | ICD-10-CM | POA: Diagnosis not present

## 2017-02-09 HISTORY — PX: CATARACT EXTRACTION, BILATERAL: SHX1313

## 2017-02-10 DIAGNOSIS — E7849 Other hyperlipidemia: Secondary | ICD-10-CM | POA: Diagnosis not present

## 2017-02-24 DIAGNOSIS — E7849 Other hyperlipidemia: Secondary | ICD-10-CM | POA: Diagnosis not present

## 2017-03-10 DIAGNOSIS — E7849 Other hyperlipidemia: Secondary | ICD-10-CM | POA: Diagnosis not present

## 2017-03-24 DIAGNOSIS — E7849 Other hyperlipidemia: Secondary | ICD-10-CM | POA: Diagnosis not present

## 2017-04-07 DIAGNOSIS — E7849 Other hyperlipidemia: Secondary | ICD-10-CM | POA: Diagnosis not present

## 2017-04-09 DIAGNOSIS — H2523 Age-related cataract, morgagnian type, bilateral: Secondary | ICD-10-CM | POA: Diagnosis not present

## 2017-04-09 DIAGNOSIS — H524 Presbyopia: Secondary | ICD-10-CM | POA: Diagnosis not present

## 2017-04-09 DIAGNOSIS — H11002 Unspecified pterygium of left eye: Secondary | ICD-10-CM | POA: Diagnosis not present

## 2017-04-21 DIAGNOSIS — E7849 Other hyperlipidemia: Secondary | ICD-10-CM | POA: Diagnosis not present

## 2017-04-28 DIAGNOSIS — H25811 Combined forms of age-related cataract, right eye: Secondary | ICD-10-CM | POA: Diagnosis not present

## 2017-04-28 DIAGNOSIS — H268 Other specified cataract: Secondary | ICD-10-CM | POA: Diagnosis not present

## 2017-04-28 DIAGNOSIS — H2521 Age-related cataract, morgagnian type, right eye: Secondary | ICD-10-CM | POA: Diagnosis not present

## 2017-05-05 DIAGNOSIS — E7849 Other hyperlipidemia: Secondary | ICD-10-CM | POA: Diagnosis not present

## 2017-05-19 DIAGNOSIS — E7849 Other hyperlipidemia: Secondary | ICD-10-CM | POA: Diagnosis not present

## 2017-06-02 DIAGNOSIS — E7849 Other hyperlipidemia: Secondary | ICD-10-CM | POA: Diagnosis not present

## 2017-06-16 DIAGNOSIS — E7849 Other hyperlipidemia: Secondary | ICD-10-CM | POA: Diagnosis not present

## 2017-06-30 DIAGNOSIS — I10 Essential (primary) hypertension: Secondary | ICD-10-CM | POA: Diagnosis not present

## 2017-07-14 DIAGNOSIS — E7849 Other hyperlipidemia: Secondary | ICD-10-CM | POA: Diagnosis not present

## 2017-07-19 DIAGNOSIS — H11002 Unspecified pterygium of left eye: Secondary | ICD-10-CM | POA: Diagnosis not present

## 2017-08-02 DIAGNOSIS — E7849 Other hyperlipidemia: Secondary | ICD-10-CM | POA: Diagnosis not present

## 2017-08-04 DIAGNOSIS — H11052 Peripheral pterygium, progressive, left eye: Secondary | ICD-10-CM | POA: Diagnosis not present

## 2017-08-04 DIAGNOSIS — H11002 Unspecified pterygium of left eye: Secondary | ICD-10-CM | POA: Diagnosis not present

## 2017-08-16 DIAGNOSIS — E7849 Other hyperlipidemia: Secondary | ICD-10-CM | POA: Diagnosis not present

## 2017-09-01 DIAGNOSIS — E7849 Other hyperlipidemia: Secondary | ICD-10-CM | POA: Diagnosis not present

## 2017-09-15 DIAGNOSIS — E7849 Other hyperlipidemia: Secondary | ICD-10-CM | POA: Diagnosis not present

## 2017-09-29 DIAGNOSIS — E7849 Other hyperlipidemia: Secondary | ICD-10-CM | POA: Diagnosis not present

## 2017-10-18 DIAGNOSIS — E7849 Other hyperlipidemia: Secondary | ICD-10-CM | POA: Diagnosis not present

## 2017-10-20 DIAGNOSIS — H2522 Age-related cataract, morgagnian type, left eye: Secondary | ICD-10-CM | POA: Diagnosis not present

## 2017-10-20 DIAGNOSIS — H25812 Combined forms of age-related cataract, left eye: Secondary | ICD-10-CM | POA: Diagnosis not present

## 2017-10-20 DIAGNOSIS — H2589 Other age-related cataract: Secondary | ICD-10-CM | POA: Diagnosis not present

## 2017-11-03 DIAGNOSIS — E7849 Other hyperlipidemia: Secondary | ICD-10-CM | POA: Diagnosis not present

## 2017-11-17 DIAGNOSIS — E7849 Other hyperlipidemia: Secondary | ICD-10-CM | POA: Diagnosis not present

## 2017-12-01 DIAGNOSIS — E7849 Other hyperlipidemia: Secondary | ICD-10-CM | POA: Diagnosis not present

## 2017-12-05 DIAGNOSIS — Z23 Encounter for immunization: Secondary | ICD-10-CM | POA: Diagnosis not present

## 2017-12-15 DIAGNOSIS — E7849 Other hyperlipidemia: Secondary | ICD-10-CM | POA: Diagnosis not present

## 2017-12-22 DIAGNOSIS — H26492 Other secondary cataract, left eye: Secondary | ICD-10-CM | POA: Diagnosis not present

## 2017-12-24 DIAGNOSIS — I10 Essential (primary) hypertension: Secondary | ICD-10-CM | POA: Diagnosis not present

## 2017-12-24 DIAGNOSIS — E7849 Other hyperlipidemia: Secondary | ICD-10-CM | POA: Diagnosis not present

## 2017-12-29 DIAGNOSIS — E7849 Other hyperlipidemia: Secondary | ICD-10-CM | POA: Diagnosis not present

## 2017-12-29 DIAGNOSIS — Z0189 Encounter for other specified special examinations: Secondary | ICD-10-CM | POA: Diagnosis not present

## 2017-12-29 DIAGNOSIS — R739 Hyperglycemia, unspecified: Secondary | ICD-10-CM | POA: Diagnosis not present

## 2017-12-29 DIAGNOSIS — I10 Essential (primary) hypertension: Secondary | ICD-10-CM | POA: Diagnosis not present

## 2018-01-12 DIAGNOSIS — E7849 Other hyperlipidemia: Secondary | ICD-10-CM | POA: Diagnosis not present

## 2018-01-26 DIAGNOSIS — E7849 Other hyperlipidemia: Secondary | ICD-10-CM | POA: Diagnosis not present

## 2018-02-11 DIAGNOSIS — E7849 Other hyperlipidemia: Secondary | ICD-10-CM | POA: Diagnosis not present

## 2018-02-15 ENCOUNTER — Telehealth: Payer: Self-pay | Admitting: Family Medicine

## 2018-02-15 NOTE — Telephone Encounter (Signed)
Called patients daughter, ok by dpr, to let patient know that she was overdue for a CPE. If she calls back, schedule the patient at their earliest convenience. If they are going elsewhere for their health care needs let me know.

## 2018-02-25 DIAGNOSIS — E7849 Other hyperlipidemia: Secondary | ICD-10-CM | POA: Diagnosis not present

## 2018-03-11 DIAGNOSIS — E7849 Other hyperlipidemia: Secondary | ICD-10-CM | POA: Diagnosis not present

## 2018-03-25 ENCOUNTER — Ambulatory Visit (INDEPENDENT_AMBULATORY_CARE_PROVIDER_SITE_OTHER): Payer: Medicare Other | Admitting: Cardiology

## 2018-03-25 DIAGNOSIS — E785 Hyperlipidemia, unspecified: Secondary | ICD-10-CM

## 2018-03-25 MED ORDER — ALIROCUMAB 150 MG/ML ~~LOC~~ SOAJ: 1.0000 "pen " | SUBCUTANEOUS | 0 refills | Status: DC

## 2018-03-25 MED ORDER — ALIROCUMAB 75 MG/ML ~~LOC~~ SOSY: 150.0000 mL | PREFILLED_SYRINGE | Freq: Once | SUBCUTANEOUS | Status: DC

## 2018-03-29 DIAGNOSIS — Z961 Presence of intraocular lens: Secondary | ICD-10-CM | POA: Diagnosis not present

## 2018-04-08 ENCOUNTER — Ambulatory Visit (INDEPENDENT_AMBULATORY_CARE_PROVIDER_SITE_OTHER): Payer: Medicare Other | Admitting: Cardiology

## 2018-04-08 DIAGNOSIS — E785 Hyperlipidemia, unspecified: Secondary | ICD-10-CM

## 2018-04-08 MED ORDER — ALIROCUMAB 75 MG/ML ~~LOC~~ SOSY: 75.0000 mg | PREFILLED_SYRINGE | Freq: Once | SUBCUTANEOUS | Status: DC

## 2018-04-22 ENCOUNTER — Other Ambulatory Visit: Payer: Self-pay

## 2018-04-22 ENCOUNTER — Ambulatory Visit (INDEPENDENT_AMBULATORY_CARE_PROVIDER_SITE_OTHER): Payer: Medicare Other | Admitting: Cardiology

## 2018-04-22 DIAGNOSIS — E785 Hyperlipidemia, unspecified: Secondary | ICD-10-CM

## 2018-04-22 MED ORDER — ALIROCUMAB 75 MG/ML ~~LOC~~ SOSY
75.0000 mg | PREFILLED_SYRINGE | Freq: Once | SUBCUTANEOUS | Status: AC
  Administered 2018-04-22: 150 mg via SUBCUTANEOUS

## 2018-05-06 ENCOUNTER — Ambulatory Visit (INDEPENDENT_AMBULATORY_CARE_PROVIDER_SITE_OTHER): Payer: Medicare Other | Admitting: Cardiology

## 2018-05-06 DIAGNOSIS — E785 Hyperlipidemia, unspecified: Secondary | ICD-10-CM

## 2018-05-06 MED ORDER — ALIROCUMAB 150 MG/ML ~~LOC~~ SOSY
150.0000 mg | PREFILLED_SYRINGE | Freq: Once | SUBCUTANEOUS | Status: AC
  Administered 2018-05-06: 150 mg via SUBCUTANEOUS

## 2018-05-19 ENCOUNTER — Other Ambulatory Visit: Payer: Self-pay

## 2018-05-19 ENCOUNTER — Ambulatory Visit (INDEPENDENT_AMBULATORY_CARE_PROVIDER_SITE_OTHER): Payer: Medicare Other | Admitting: Cardiology

## 2018-05-19 DIAGNOSIS — E785 Hyperlipidemia, unspecified: Secondary | ICD-10-CM | POA: Diagnosis not present

## 2018-05-19 DIAGNOSIS — E7800 Pure hypercholesterolemia, unspecified: Secondary | ICD-10-CM

## 2018-05-19 MED ORDER — ALIROCUMAB 150 MG/ML ~~LOC~~ SOSY
150.0000 mg | PREFILLED_SYRINGE | Freq: Once | SUBCUTANEOUS | Status: AC
  Administered 2018-05-19: 15:00:00 150 mg via SUBCUTANEOUS

## 2018-06-02 ENCOUNTER — Encounter: Payer: Self-pay | Admitting: Cardiology

## 2018-06-02 ENCOUNTER — Other Ambulatory Visit: Payer: Self-pay

## 2018-06-02 ENCOUNTER — Ambulatory Visit (INDEPENDENT_AMBULATORY_CARE_PROVIDER_SITE_OTHER): Payer: Medicare Other | Admitting: Cardiology

## 2018-06-02 DIAGNOSIS — E785 Hyperlipidemia, unspecified: Secondary | ICD-10-CM | POA: Diagnosis not present

## 2018-06-02 MED ORDER — ALIROCUMAB 150 MG/ML ~~LOC~~ SOSY
150.0000 mg | PREFILLED_SYRINGE | Freq: Once | SUBCUTANEOUS | Status: AC
  Administered 2018-06-02: 14:00:00 150 mg via SUBCUTANEOUS

## 2018-06-02 NOTE — Progress Notes (Signed)
Patient on rosuvastatin 10 mg along with Praluent 150 mg every 2 weeks, tolerating this without complications. Subcutaneous injection given today.

## 2018-06-07 IMAGING — DX DG ABDOMEN ACUTE W/ 1V CHEST
3 series · 3 of 3 positions shown · non-contrast
Comparison: 11/15/2013

CLINICAL DATA: 70-year-old female with a history of mid abdominal
pain

EXAM:
DG ABDOMEN ACUTE W/ 1V CHEST

[chest pa]
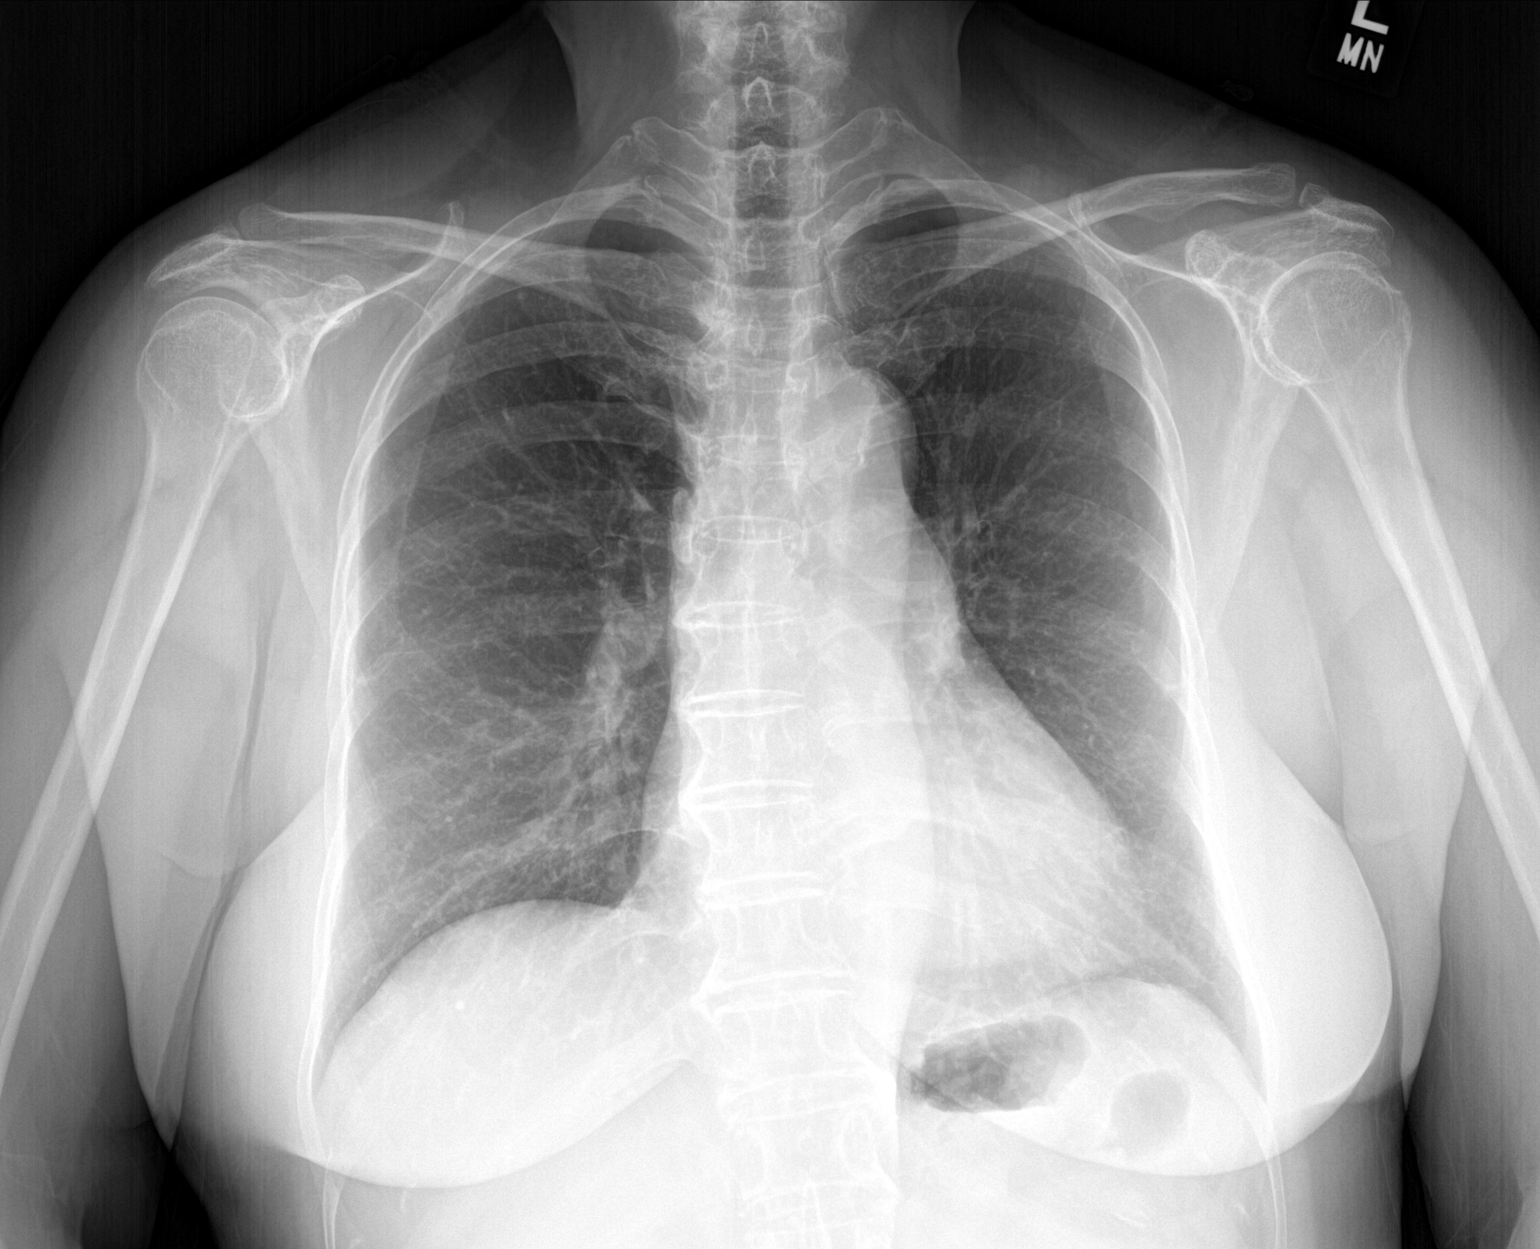

[abdomen erect]
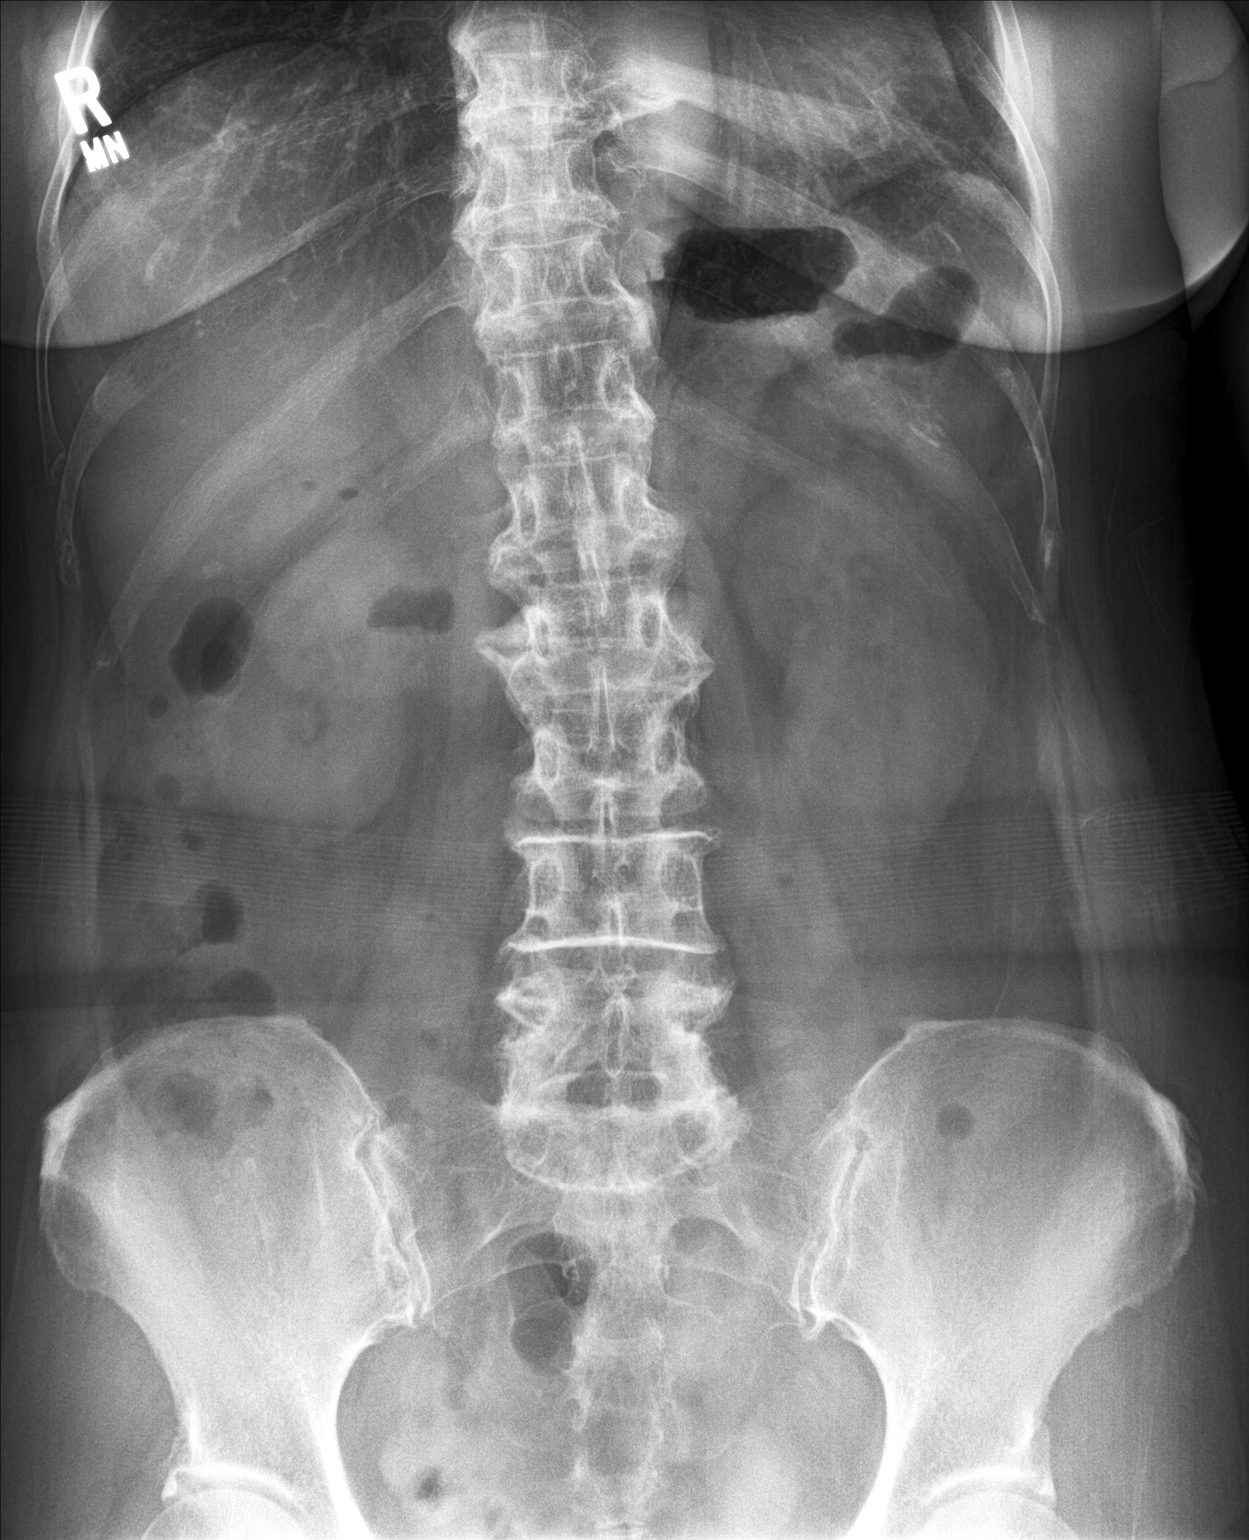

[abdomen supine]
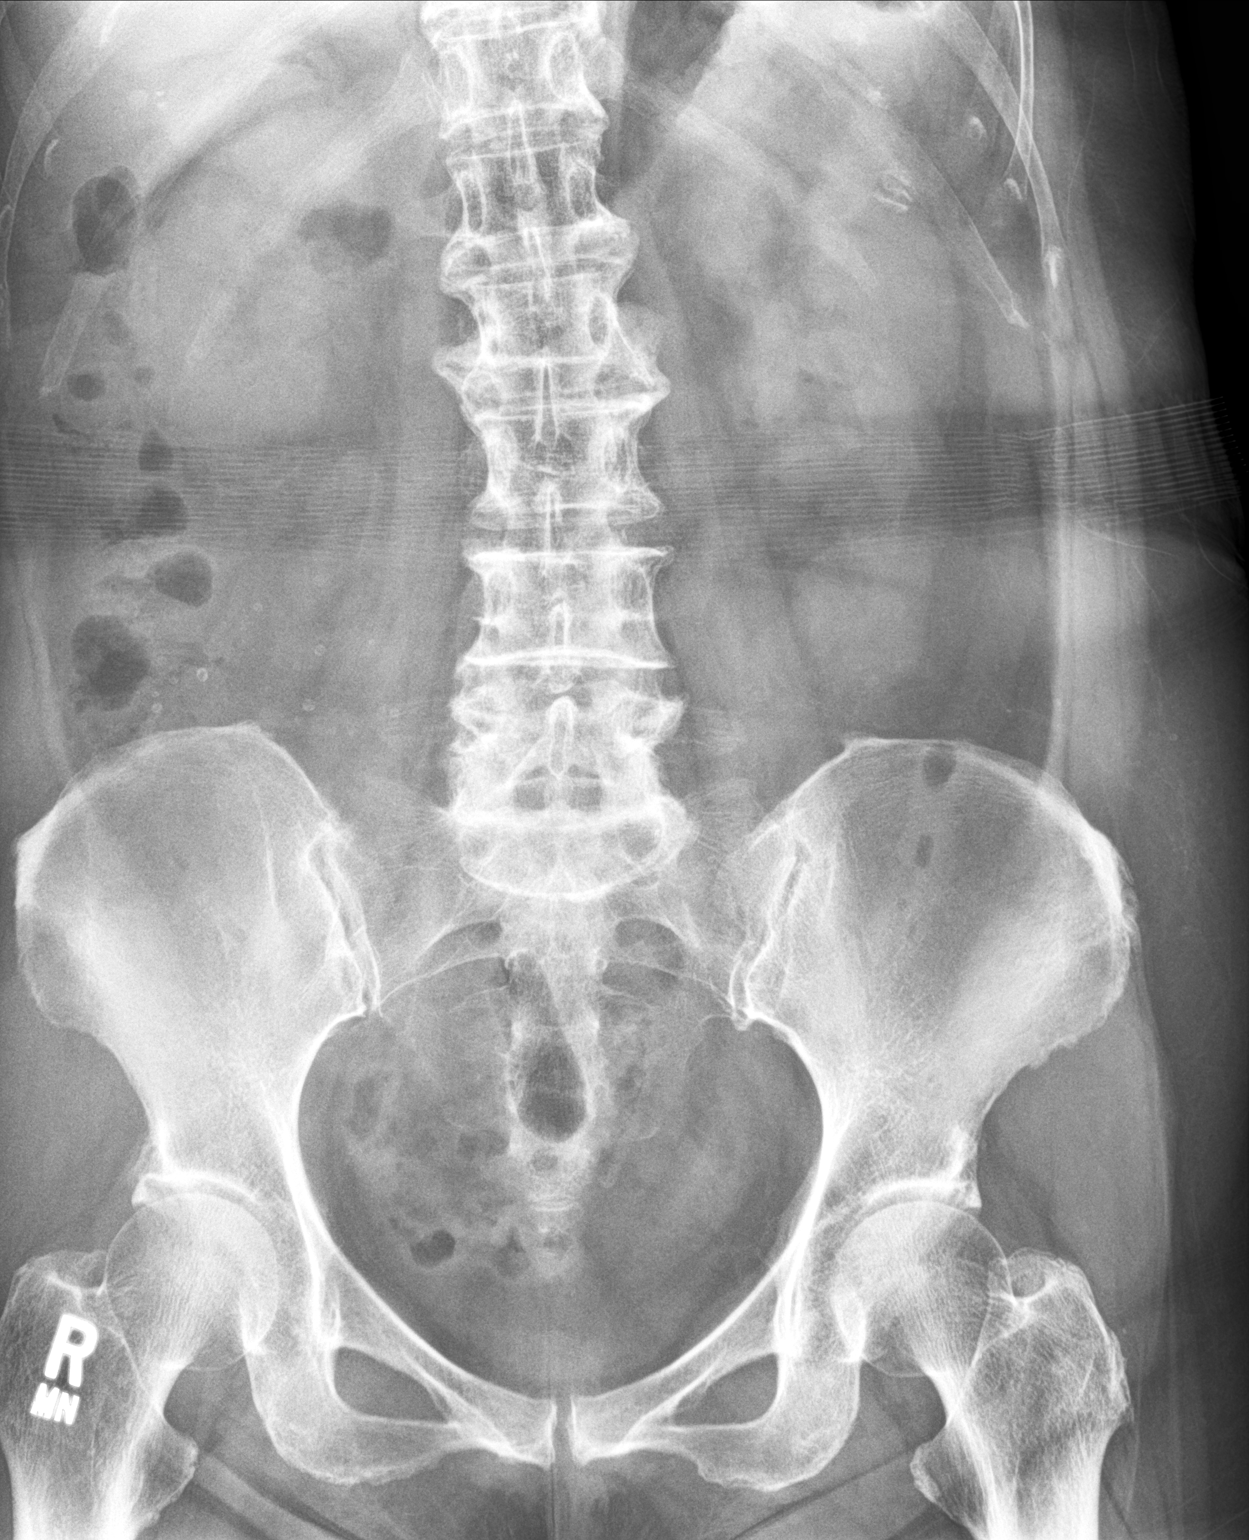

[3 of 3 positions shown; findings below may reference images not displayed]

FINDINGS: Chest:

Cardiomediastinal silhouette within normal limits in size and
contour.

Calcifications of the aortic arch.

No evidence of central vascular congestion.

No confluent airspace disease, pneumothorax, or pleural effusion.

Abdomen:

Gas within stomach, small bowel, colon, without abnormal distention.

No air-fluid levels.

Unremarkable silhouette of the bilateral kidneys. No unexpected
calcifications.

No unexpected soft tissue density.

No radiopaque foreign body.

No displaced fracture.

Degenerative changes of the spine. Degenerative changes bilateral
hips.
IMPRESSION: Chest:

No radiographic evidence of acute cardiopulmonary disease.

Abdomen:

Unremarkable appearance of the abdomen.

## 2018-06-16 ENCOUNTER — Other Ambulatory Visit: Payer: Self-pay

## 2018-06-16 ENCOUNTER — Ambulatory Visit (INDEPENDENT_AMBULATORY_CARE_PROVIDER_SITE_OTHER): Payer: Medicare Other | Admitting: Cardiology

## 2018-06-16 ENCOUNTER — Telehealth: Payer: Self-pay

## 2018-06-16 DIAGNOSIS — E7800 Pure hypercholesterolemia, unspecified: Secondary | ICD-10-CM | POA: Diagnosis not present

## 2018-06-16 MED ORDER — ALIROCUMAB 150 MG/ML ~~LOC~~ SOAJ
150.0000 mg | Freq: Once | SUBCUTANEOUS | Status: AC
  Administered 2019-06-20: 150 mg via SUBCUTANEOUS

## 2018-06-16 NOTE — Progress Notes (Signed)
Praluent 150 mg sq administered.

## 2018-06-16 NOTE — Telephone Encounter (Signed)
Please sign nurse visit for praluent injection today

## 2018-06-30 ENCOUNTER — Other Ambulatory Visit: Payer: Self-pay

## 2018-06-30 ENCOUNTER — Ambulatory Visit: Payer: Medicare Other

## 2018-06-30 DIAGNOSIS — E7800 Pure hypercholesterolemia, unspecified: Secondary | ICD-10-CM

## 2018-06-30 MED ORDER — ALIROCUMAB 75 MG/ML ~~LOC~~ SOAJ
75.0000 mg | Freq: Once | SUBCUTANEOUS | Status: AC
  Administered 2019-06-06: 14:00:00 75 mg via SUBCUTANEOUS

## 2018-07-14 ENCOUNTER — Other Ambulatory Visit: Payer: Self-pay | Admitting: Cardiology

## 2018-07-14 ENCOUNTER — Other Ambulatory Visit: Payer: Self-pay

## 2018-07-14 ENCOUNTER — Ambulatory Visit (INDEPENDENT_AMBULATORY_CARE_PROVIDER_SITE_OTHER): Payer: Medicare Other | Admitting: Cardiology

## 2018-07-14 DIAGNOSIS — H0266 Xanthelasma of left eye, unspecified eyelid: Secondary | ICD-10-CM

## 2018-07-14 DIAGNOSIS — R739 Hyperglycemia, unspecified: Secondary | ICD-10-CM

## 2018-07-14 DIAGNOSIS — E7801 Familial hypercholesterolemia: Secondary | ICD-10-CM | POA: Diagnosis not present

## 2018-07-14 DIAGNOSIS — I10 Essential (primary) hypertension: Secondary | ICD-10-CM | POA: Diagnosis not present

## 2018-07-14 MED ORDER — ALIROCUMAB 150 MG/ML ~~LOC~~ SOSY
150.0000 mg | PREFILLED_SYRINGE | Freq: Once | SUBCUTANEOUS | Status: AC
  Administered 2018-07-14: 150 mg via SUBCUTANEOUS

## 2018-07-14 MED ORDER — METOPROLOL TARTRATE 50 MG PO TABS: 50.0000 mg | ORAL_TABLET | Freq: Once | ORAL | 0 refills | Status: DC

## 2018-07-14 MED ORDER — ROSUVASTATIN CALCIUM 10 MG PO TABS: ORAL_TABLET | ORAL | 11 refills | Status: DC

## 2018-07-14 MED ORDER — LISINOPRIL-HYDROCHLOROTHIAZIDE 20-25 MG PO TABS: 1.0000 | ORAL_TABLET | Freq: Every day | ORAL | 2 refills | Status: DC

## 2018-07-14 MED ORDER — AMLODIPINE BESYLATE 5 MG PO TABS: 5.0000 mg | ORAL_TABLET | Freq: Every day | ORAL | 2 refills | Status: DC

## 2018-07-15 NOTE — Progress Notes (Signed)
Primary Physician/Referring:  Shawnee Knapp, MD  Patient ID: Becky Turner, female    DOB: 1945/06/19, 73 y.o.   MRN: [id number]  No chief complaint on file.   HPI: Becky Turner  is a 73 y.o. female  with [place] female with familial hyperlipidemia, LDL in July 2017 was 355. She is on low dose Crestor due to myalgias and fatigue which she is tolerating. She was started on Praluent in November 2017. She also has hyperglycemia, hypertension. Initially presented with symptoms suggestive of angina pectoris and symptoms have completey resolved.  She has had a negative nuclear stress test on 11/01/2015. She now presents here for follow-up of hyperlipidemia and hypertension for praluent injection.  Essentially asymptomatic.Accompanied by her daughter at the bedside.  History reviewed. No pertinent past medical history.  History reviewed. No pertinent surgical history.  Social History   Socioeconomic History  . Marital status: Married    Spouse name: Not on file  . Number of children: Not on file  . Years of education: Not on file  . Highest education level: Not on file  Occupational History  . Not on file  Social Needs  . Financial resource strain: Not on file  . Food insecurity:    Worry: Not on file    Inability: Not on file  . Transportation needs:    Medical: Not on file    Non-medical: Not on file  Tobacco Use  . Smoking status: Never Smoker  . Smokeless tobacco: Never Used  Substance and Sexual Activity  . Alcohol use: No  . Drug use: No  . Sexual activity: Not on file  Lifestyle  . Physical activity:    Days per week: Not on file    Minutes per session: Not on file  . Stress: Not on file  Relationships  . Social connections:    Talks on phone: Not on file    Gets together: Not on file    Attends religious service: Not on file    Active member of club or organization: Not on file    Attends meetings of clubs or organizations: Not on file    Relationship status: Not  on file  . Intimate partner violence:    Fear of current or ex partner: Not on file    Emotionally abused: Not on file    Physically abused: Not on file    Forced sexual activity: Not on file  Other Topics Concern  . Not on file  Social History Narrative  . Not on file   Review of Systems  Constitution: Negative for chills, decreased appetite, malaise/fatigue and weight gain.  Cardiovascular: Negative for dyspnea on exertion, leg swelling and syncope.  Endocrine: Negative for cold intolerance.  Hematologic/Lymphatic: Does not bruise/bleed easily.  Musculoskeletal: Positive for arthritis and back pain. Negative for joint swelling.  Gastrointestinal: Negative for abdominal pain, anorexia, change in bowel habit, hematochezia and melena.  Neurological: Negative for headaches and light-headedness.  Psychiatric/Behavioral: Negative for depression and substance abuse.  All other systems reviewed and are negative.     Objective  There were no vitals taken for this visit. There is no height or weight on file to calculate BMI.    Physical Exam  Constitutional: She appears well-developed and well-nourished. No distress.  HENT:  Head: Atraumatic.  Eyes: Conjunctivae are normal.  Left eyelid Xanthelasma noted. Much improved since Praluent therapy  Neck: Neck supple. No JVD present. No thyromegaly present.  Cardiovascular: Normal rate, regular rhythm, normal heart  sounds and intact distal pulses. Exam reveals no gallop.  No murmur heard. Pulmonary/Chest: Effort normal and breath sounds normal.  Abdominal: Soft. Bowel sounds are normal.  Musculoskeletal: Normal range of motion.  Neurological: She is alert.  Skin: Skin is warm and dry.  Psychiatric: She has a normal mood and affect.   Radiology: No results found.  Laboratory examination:   Labs 12/24/2017: Total cholesterol 153, triglycerides 161, HDL 72, LDL 49.  TSH normal.  Serum glucose 126 mg, BUN 19, creatinine 1.13, eGFR 49 mL,  potassium 5.7.  CMP otherwise normal.  CMP Latest Ref Rng & Units 09/14/2015 09/08/2015 09/08/2015  Glucose 65 - 99 mg/dL 141(H) 150(H) 153(H)  BUN 7 - 25 mg/dL '14 7 7  ' Creatinine 0.60 - 0.93 mg/dL 0.92 0.60 0.67  Sodium 135 - 146 mmol/L 138 142 137  Potassium 3.5 - 5.3 mmol/L 4.3 3.8 3.6  Chloride 98 - 110 mmol/L 99 103 104  CO2 20 - 31 mmol/L 28 - 24  Calcium 8.6 - 10.4 mg/dL 9.7 - 8.9  Total Protein 6.5 - 8.1 g/dL - - 7.9  Total Bilirubin 0.3 - 1.2 mg/dL - - 0.3  Alkaline Phos 38 - 126 U/L - - 50  AST 15 - 41 U/L - - 19  ALT 14 - 54 U/L - - 18   CBC Latest Ref Rng & Units 09/14/2015 09/08/2015 09/08/2015  WBC 4.6 - 10.2 K/uL 7.1 - 7.5  Hemoglobin 12.2 - 16.2 g/dL 14.7 16.3(H) 14.5  Hematocrit 37.7 - 47.9 % 43.3 48.0(H) 45.9  Platelets 150 - 400 K/uL - - 241   Lipid Panel     Component Value Date/Time   CHOL 455 (H) 09/20/2015 1221   TRIG 224 (H) 09/20/2015 1221   HDL 55 09/20/2015 1221   CHOLHDL 8.3 (H) 09/20/2015 1221   VLDL 45 (H) 09/20/2015 1221   LDLCALC 355 (H) 09/20/2015 1221   HEMOGLOBIN A1C Lab Results  Component Value Date   HGBA1C 6.0 09/20/2015   TSH No results for input(s): TSH in the last 8760 hours.  PRN Meds:. There are no discontinued medications. No outpatient medications have been marked as taking for the 07/14/18 encounter (Clinical Support) with PCV-NURSE.   Current Facility-Administered Medications for the 07/14/18 encounter (Clinical Support) with PCV-NURSE  Medication  . Alirocumab SOAJ 150 mg  . Alirocumab SOAJ 75 mg    Cardiac Studies:   Nuclear stress test [11/01/2015]:  1. The resting electrocardiogram demonstrated normal sinus rhythm, incomplete RBBB and no resting arrhythmias. Stress EKG is non-diagnostic for ischemia as it a pharmacologic stress using Lexiscan. Stress symptoms included dizziness. 2. Myocardial perfusion imaging is normal. Overall left ventricular systolic function was normal without regional wall motion abnormalities. The  left ventricular ejection fraction was 59%.  Assessment   Familial hypercholesterolemia - Plan: Alirocumab SOSY 150 mg, Lipid Panel With LDL/HDL Ratio, CMP14+EGFR, TSH  Essential hypertension  Hyperglycemia  Xanthelasma of left eyelid  EKG 12/29/2017: Normal sinus rhythm at rate of 65 bpm, normal axis.  Nonspecific ST-T abnormality. No significant change from EKG 12/23/2016  Recommendations:   Patient received 150 mg of Praluent subcutaneously today.  Tolerated well. She needs lipid profile testing. Lab orders have been placed. She will need OV in 6 months for BP check and also f/u labs. Continue present medications.   Adrian Prows, MD, [facility] 07/15/2018, 6:02 AM [place] [facility] [place] Pager: [phone] Office: [phone] If no answer Cell [phone]

## 2018-07-18 ENCOUNTER — Other Ambulatory Visit: Payer: Self-pay

## 2018-07-18 MED ORDER — METOPROLOL TARTRATE 50 MG PO TABS: 50.0000 mg | ORAL_TABLET | Freq: Every day | ORAL | 0 refills | Status: DC

## 2018-07-18 MED ORDER — METOPROLOL SUCCINATE ER 25 MG PO TB24: 25.0000 mg | ORAL_TABLET | Freq: Every day | ORAL | 1 refills | Status: DC

## 2018-07-18 NOTE — Telephone Encounter (Signed)
Patient paged me stating that she received Toprol metoprolol succinate 50 mg daily but the instructions states take 25 mg.  Patient urgently on 50 mg tablets, I reviewed her medical records, does not appear that we sent in a Rx for 25 mg, however advised her to continue taking 50 mg once a day and to bring the bottle to [place] when she comes in for Repatha subcutaneous injections next time.

## 2018-07-21 ENCOUNTER — Other Ambulatory Visit: Payer: Self-pay

## 2018-07-21 MED ORDER — PRALUENT 150 MG/ML ~~LOC~~ SOAJ: 150.0000 mL | SUBCUTANEOUS | 3 refills | Status: DC

## 2018-07-28 ENCOUNTER — Other Ambulatory Visit: Payer: Self-pay

## 2018-07-28 ENCOUNTER — Ambulatory Visit (INDEPENDENT_AMBULATORY_CARE_PROVIDER_SITE_OTHER): Payer: Medicare Other | Admitting: Cardiology

## 2018-07-28 DIAGNOSIS — E7801 Familial hypercholesterolemia: Secondary | ICD-10-CM | POA: Diagnosis not present

## 2018-07-28 DIAGNOSIS — E785 Hyperlipidemia, unspecified: Secondary | ICD-10-CM | POA: Diagnosis not present

## 2018-07-28 MED ORDER — ALIROCUMAB 150 MG/ML ~~LOC~~ SOSY
150.0000 mg | PREFILLED_SYRINGE | Freq: Once | SUBCUTANEOUS | Status: AC
  Administered 2018-07-28: 150 mg via SUBCUTANEOUS

## 2018-07-29 NOTE — Progress Notes (Signed)
Patient received Alirocumab SOSY 150 mg Subcutaneous without complications.

## 2018-08-11 ENCOUNTER — Telehealth: Payer: Self-pay

## 2018-08-11 ENCOUNTER — Other Ambulatory Visit: Payer: Self-pay

## 2018-08-11 ENCOUNTER — Ambulatory Visit (INDEPENDENT_AMBULATORY_CARE_PROVIDER_SITE_OTHER): Payer: Medicare Other | Admitting: Cardiology

## 2018-08-11 DIAGNOSIS — E7800 Pure hypercholesterolemia, unspecified: Secondary | ICD-10-CM | POA: Diagnosis not present

## 2018-08-11 MED ORDER — ALIROCUMAB 150 MG/ML ~~LOC~~ SOAJ
150.0000 mg | Freq: Once | SUBCUTANEOUS | Status: AC
  Administered 2019-05-23: 14:00:00 150 mg via SUBCUTANEOUS

## 2018-08-11 NOTE — Telephone Encounter (Signed)
Administered 150 mg/ml into L Thigh  Pt tolerated well Lot# [id number] 05/10/2019 Administered by Tayah Wall RMA  

## 2018-08-11 NOTE — Progress Notes (Signed)
Administered 150 mg/ml into L Thigh  Pt tolerated well Lot# [id number] 05/10/2019 Administered by Bynum Bellows RMA

## 2018-08-25 ENCOUNTER — Other Ambulatory Visit: Payer: Self-pay

## 2018-08-25 ENCOUNTER — Ambulatory Visit (INDEPENDENT_AMBULATORY_CARE_PROVIDER_SITE_OTHER): Payer: Medicare Other | Admitting: Cardiology

## 2018-08-25 DIAGNOSIS — E785 Hyperlipidemia, unspecified: Secondary | ICD-10-CM

## 2018-08-25 MED ORDER — ALIROCUMAB 150 MG/ML ~~LOC~~ SOAJ
150.0000 mg | Freq: Once | SUBCUTANEOUS | Status: AC
  Administered 2018-08-25: 150 mg via SUBCUTANEOUS

## 2018-08-25 NOTE — Progress Notes (Signed)
Alirocumab SOAJ 150 mg  [[id number]]  Ordered Dose: 150 mg Route: Subcutaneous Frequency: Once

## 2018-09-08 ENCOUNTER — Ambulatory Visit (INDEPENDENT_AMBULATORY_CARE_PROVIDER_SITE_OTHER): Payer: Medicare Other | Admitting: Cardiology

## 2018-09-08 DIAGNOSIS — E7800 Pure hypercholesterolemia, unspecified: Secondary | ICD-10-CM

## 2018-09-08 MED ORDER — ALIROCUMAB 150 MG/ML ~~LOC~~ SOAJ
150.0000 mg | Freq: Once | SUBCUTANEOUS | Status: AC
  Administered 2018-09-08: 150 mg via SUBCUTANEOUS

## 2018-09-22 ENCOUNTER — Ambulatory Visit (INDEPENDENT_AMBULATORY_CARE_PROVIDER_SITE_OTHER): Payer: Medicare Other | Admitting: Cardiology

## 2018-09-22 ENCOUNTER — Other Ambulatory Visit: Payer: Self-pay

## 2018-09-22 DIAGNOSIS — E7800 Pure hypercholesterolemia, unspecified: Secondary | ICD-10-CM | POA: Diagnosis not present

## 2018-09-22 MED ORDER — ALIROCUMAB 75 MG/ML ~~LOC~~ SOAJ
75.0000 mg | Freq: Once | SUBCUTANEOUS | Status: AC
  Administered 2018-09-22: 75 mg via SUBCUTANEOUS

## 2018-09-24 NOTE — Progress Notes (Signed)
Ordered Dose: 75 mg Route: Subcutaneous Frequency: Once  Administration Dose: --     Scheduled Start Date/Time: 09/22/18 1400 End Date/Time: 09/22/18 1421 after 1 doses      Diagnosis Association: Pure hypercholesterolemia (E78.00)    Order Status: Completed Thu Sep 22, 2018 1421, originally scheduled to end   NF Ordered: Yes NF post-verification: No Non-Formulary Code: Acceptable to use formulary alternative  Order Pended By: Milderd Meager, CMA Pended Date/Time: Thu Sep 22, 2018 1353

## 2018-09-27 DIAGNOSIS — H5203 Hypermetropia, bilateral: Secondary | ICD-10-CM | POA: Diagnosis not present

## 2018-09-27 DIAGNOSIS — Z961 Presence of intraocular lens: Secondary | ICD-10-CM | POA: Diagnosis not present

## 2018-10-06 ENCOUNTER — Other Ambulatory Visit: Payer: Self-pay

## 2018-10-06 ENCOUNTER — Ambulatory Visit (INDEPENDENT_AMBULATORY_CARE_PROVIDER_SITE_OTHER): Payer: Medicare Other | Admitting: Cardiology

## 2018-10-06 DIAGNOSIS — E7800 Pure hypercholesterolemia, unspecified: Secondary | ICD-10-CM

## 2018-10-06 MED ORDER — ALIROCUMAB 150 MG/ML ~~LOC~~ SOAJ
150.0000 mg | Freq: Once | SUBCUTANEOUS | Status: AC
  Administered 2018-10-24: 14:00:00 150 mg via SUBCUTANEOUS

## 2018-10-12 ENCOUNTER — Other Ambulatory Visit: Payer: Self-pay | Admitting: Cardiology

## 2018-10-14 NOTE — Progress Notes (Signed)
Patient is here for Praluent injection.

## 2018-10-24 ENCOUNTER — Ambulatory Visit (INDEPENDENT_AMBULATORY_CARE_PROVIDER_SITE_OTHER): Payer: Medicare Other | Admitting: Cardiology

## 2018-10-24 ENCOUNTER — Other Ambulatory Visit: Payer: Self-pay

## 2018-10-24 DIAGNOSIS — I10 Essential (primary) hypertension: Secondary | ICD-10-CM

## 2018-11-07 ENCOUNTER — Other Ambulatory Visit: Payer: Self-pay

## 2018-11-07 ENCOUNTER — Ambulatory Visit (INDEPENDENT_AMBULATORY_CARE_PROVIDER_SITE_OTHER): Payer: Medicare Other | Admitting: Cardiology

## 2018-11-07 DIAGNOSIS — E7800 Pure hypercholesterolemia, unspecified: Secondary | ICD-10-CM

## 2018-11-07 MED ORDER — PRALUENT 150 MG/ML ~~LOC~~ SOAJ: 150.0000 mL | SUBCUTANEOUS | 3 refills | Status: DC

## 2018-11-07 MED ORDER — ALIROCUMAB 150 MG/ML ~~LOC~~ SOAJ
150.0000 mg | Freq: Once | SUBCUTANEOUS | Status: AC
  Administered 2018-11-07: 14:00:00 150 mg via SUBCUTANEOUS

## 2018-11-21 ENCOUNTER — Other Ambulatory Visit: Payer: Self-pay

## 2018-11-21 ENCOUNTER — Ambulatory Visit (INDEPENDENT_AMBULATORY_CARE_PROVIDER_SITE_OTHER): Payer: Medicare Other | Admitting: Cardiology

## 2018-11-21 DIAGNOSIS — E7800 Pure hypercholesterolemia, unspecified: Secondary | ICD-10-CM | POA: Diagnosis not present

## 2018-11-21 MED ORDER — ALIROCUMAB 150 MG/ML ~~LOC~~ SOAJ
150.0000 mg | Freq: Once | SUBCUTANEOUS | Status: AC
  Administered 2018-11-21: 14:00:00 150 mg via SUBCUTANEOUS

## 2018-11-22 NOTE — Progress Notes (Signed)
Patient here for Praluent injection only

## 2018-12-05 ENCOUNTER — Ambulatory Visit (INDEPENDENT_AMBULATORY_CARE_PROVIDER_SITE_OTHER): Payer: Medicare Other | Admitting: Cardiology

## 2018-12-05 ENCOUNTER — Other Ambulatory Visit: Payer: Self-pay

## 2018-12-05 DIAGNOSIS — E7800 Pure hypercholesterolemia, unspecified: Secondary | ICD-10-CM

## 2018-12-05 MED ORDER — ALIROCUMAB 150 MG/ML ~~LOC~~ SOAJ
150.0000 mg | Freq: Once | SUBCUTANEOUS | Status: AC
  Administered 2018-12-05: 14:00:00 150 mg via SUBCUTANEOUS

## 2018-12-05 NOTE — Progress Notes (Signed)
Patient here for Praluent injection only 

## 2018-12-19 ENCOUNTER — Encounter: Payer: Self-pay | Admitting: Cardiology

## 2018-12-19 ENCOUNTER — Ambulatory Visit (INDEPENDENT_AMBULATORY_CARE_PROVIDER_SITE_OTHER): Payer: Medicare Other | Admitting: Cardiology

## 2018-12-19 ENCOUNTER — Other Ambulatory Visit: Payer: Self-pay

## 2018-12-19 DIAGNOSIS — E7801 Familial hypercholesterolemia: Secondary | ICD-10-CM | POA: Diagnosis not present

## 2018-12-19 DIAGNOSIS — E7800 Pure hypercholesterolemia, unspecified: Secondary | ICD-10-CM | POA: Diagnosis not present

## 2018-12-19 DIAGNOSIS — R739 Hyperglycemia, unspecified: Secondary | ICD-10-CM | POA: Diagnosis not present

## 2018-12-19 MED ORDER — ALIROCUMAB 150 MG/ML ~~LOC~~ SOAJ
150.0000 mg | Freq: Once | SUBCUTANEOUS | Status: AC
  Administered 2018-12-19: 14:00:00 150 mg via SUBCUTANEOUS

## 2018-12-19 NOTE — Progress Notes (Signed)
Patient here for praluent injection only

## 2018-12-20 ENCOUNTER — Other Ambulatory Visit: Payer: Self-pay | Admitting: Cardiology

## 2018-12-20 DIAGNOSIS — I10 Essential (primary) hypertension: Secondary | ICD-10-CM

## 2018-12-20 DIAGNOSIS — N1831 Chronic kidney disease, stage 3a: Secondary | ICD-10-CM

## 2018-12-20 LAB — LIPID PANEL WITH LDL/HDL RATIO
Cholesterol, Total: 134 mg/dL (ref 100–199)
HDL: 64 mg/dL (ref >39)
LDL Chol Calc (NIH): 45 mg/dL (ref 0–99)
LDL/HDL Ratio: 0.7 ratio (ref 0.0–3.2)
Triglycerides: 153 mg/dL — ABNORMAL HIGH (ref 0–149)
VLDL Cholesterol Cal: 25 mg/dL (ref 5–40)

## 2018-12-20 LAB — CMP14+EGFR
ALT: 26 IU/L (ref 0–32)
AST: 20 IU/L (ref 0–40)
Albumin/Globulin Ratio: 1.5 (ref 1.2–2.2)
Albumin: 4.4 g/dL (ref 3.7–4.7)
Alkaline Phosphatase: 59 IU/L (ref 39–117)
BUN/Creatinine Ratio: 16 (ref 12–28)
BUN: 20 mg/dL (ref 8–27)
Bilirubin Total: 0.4 mg/dL (ref 0.0–1.2)
CO2: 24 mmol/L (ref 20–29)
Calcium: 9.2 mg/dL (ref 8.7–10.3)
Chloride: 101 mmol/L (ref 96–106)
Creatinine, Ser: 1.28 mg/dL — ABNORMAL HIGH (ref 0.57–1.00)
GFR calc Af Amer: 48 mL/min/{1.73_m2} — ABNORMAL LOW (ref >59)
GFR calc non Af Amer: 42 mL/min/{1.73_m2} — ABNORMAL LOW (ref >59)
Globulin, Total: 2.9 g/dL (ref 1.5–4.5)
Glucose: 125 mg/dL — ABNORMAL HIGH (ref 65–99)
Potassium: 4.5 mmol/L (ref 3.5–5.2)
Sodium: 140 mmol/L (ref 134–144)
Total Protein: 7.3 g/dL (ref 6.0–8.5)

## 2018-12-20 LAB — HGB A1C W/O EAG: Hgb A1c MFr Bld: 6.6 % — ABNORMAL HIGH (ref 4.8–5.6)

## 2018-12-20 LAB — TSH: TSH: 3.93 u[IU]/mL (ref 0.450–4.500)

## 2018-12-20 MED ORDER — LISINOPRIL-HYDROCHLOROTHIAZIDE 20-25 MG PO TABS: 0.5000 | ORAL_TABLET | Freq: Every day | ORAL | 2 refills | Status: DC

## 2018-12-22 NOTE — Progress Notes (Signed)
Lvm for pt to call back. 

## 2018-12-25 DIAGNOSIS — Z23 Encounter for immunization: Secondary | ICD-10-CM | POA: Diagnosis not present

## 2018-12-28 ENCOUNTER — Ambulatory Visit (INDEPENDENT_AMBULATORY_CARE_PROVIDER_SITE_OTHER): Payer: Medicare Other | Admitting: Cardiology

## 2018-12-28 ENCOUNTER — Other Ambulatory Visit: Payer: Self-pay

## 2018-12-28 ENCOUNTER — Encounter: Payer: Self-pay | Admitting: Cardiology

## 2018-12-28 VITALS — BP 128/76 | HR 72 | Temp 97.8°F | Ht 60.0 in | Wt 136.0 lb

## 2018-12-28 DIAGNOSIS — E7849 Other hyperlipidemia: Secondary | ICD-10-CM

## 2018-12-28 DIAGNOSIS — I10 Essential (primary) hypertension: Secondary | ICD-10-CM | POA: Diagnosis not present

## 2018-12-28 DIAGNOSIS — R739 Hyperglycemia, unspecified: Secondary | ICD-10-CM | POA: Diagnosis not present

## 2018-12-28 DIAGNOSIS — H0262 Xanthelasma of right lower eyelid: Secondary | ICD-10-CM | POA: Diagnosis not present

## 2018-12-28 NOTE — Progress Notes (Signed)
Primary Physician/Referring:  Sherren MochaShaw, Eva N, MD  Patient ID: Kandis NabBich Savich, female    DOB: 1945-02-19, 73 y.o.   MRN: [id number]  Chief Complaint  Patient presents with  . Hypertension  . Follow-up    1 year   HPI:    Kandis NabBich Froman  is a 73 y.o. [place] female with familial hyperlipidemia, LDL in July 2017 was 355. She is on low dose Crestor due to myalgias and fatigue which she is tolerating. She was started on Praluent in November 2017. She also has hyperglycemia, hypertension. Initially presented with symptoms suggestive of angina pectoris and symptoms have completey resolved.  She has had a negative nuclear stress test on 11/01/2015.   She now presents here for follow-up of hyperlipidemia and hypertension. Essentially asymptomatic.Accompanied by her daughter at the bedside.  Past Medical History:  Diagnosis Date  . Hyperlipidemia   . Hypertension    History reviewed. No pertinent surgical history. Social History   Socioeconomic History  . Marital status: Married    Spouse name: Not on file  . Number of children: 3  . Years of education: Not on file  . Highest education level: Not on file  Occupational History  . Not on file  Social Needs  . Financial resource strain: Not on file  . Food insecurity    Worry: Not on file    Inability: Not on file  . Transportation needs    Medical: Not on file    Non-medical: Not on file  Tobacco Use  . Smoking status: Never Smoker  . Smokeless tobacco: Never Used  Substance and Sexual Activity  . Alcohol use: No  . Drug use: No  . Sexual activity: Not on file  Lifestyle  . Physical activity    Days per week: Not on file    Minutes per session: Not on file  . Stress: Not on file  Relationships  . Social Musicianconnections    Talks on phone: Not on file    Gets together: Not on file    Attends religious service: Not on file    Active member of club or organization: Not on file    Attends meetings of clubs or organizations: Not on  file    Relationship status: Not on file  . Intimate partner violence    Fear of current or ex partner: Not on file    Emotionally abused: Not on file    Physically abused: Not on file    Forced sexual activity: Not on file  Other Topics Concern  . Not on file  Social History Narrative  . Not on file   ROS  Review of Systems  Constitution: Negative for chills, decreased appetite, malaise/fatigue and weight gain.  Cardiovascular: Negative for dyspnea on exertion, leg swelling and syncope.  Endocrine: Negative for cold intolerance.  Hematologic/Lymphatic: Does not bruise/bleed easily.  Musculoskeletal: Negative for joint swelling.  Gastrointestinal: Negative for abdominal pain, anorexia, change in bowel habit, hematochezia and melena.  Neurological: Negative for headaches and light-headedness.  Psychiatric/Behavioral: Negative for depression and substance abuse.  All other systems reviewed and are negative.  Objective   Vitals with BMI 12/28/2018 10/03/2016 10/05/2015  Height 5\' 0"  5' 0.5" 5' 0.5"  Weight 136 lbs 139 lbs 13 oz 127 lbs  BMI 26.56 26.84 24.4  Systolic 154 132 027122  Diastolic 70 70 72  Pulse 72 69 66    Physical Exam  Constitutional: She appears well-nourished.  She is moderately built and petite in  no acute distress  HENT:  Head: Atraumatic.  Eyes: Conjunctivae are normal.  Right eyelid xanthelasma noted  Neck: Neck supple. No JVD present. No thyromegaly present.  Cardiovascular: Normal rate, regular rhythm, normal heart sounds, intact distal pulses and normal pulses. Exam reveals no gallop.  No murmur heard. No leg edema, no JVD.  Pulmonary/Chest: Effort normal and breath sounds normal.  Abdominal: Soft. Bowel sounds are normal.  Musculoskeletal: Normal range of motion.  Neurological: She is alert.  Skin: Skin is warm and dry.  Psychiatric: She has a normal mood and affect.   Laboratory examination:    Recent Labs    12/19/18 0841  NA 140  K 4.5   CL 101  CO2 24  GLUCOSE 125*  BUN 20  CREATININE 1.28*  CALCIUM 9.2  GFRNONAA 42*  GFRAA 48*   estimated creatinine clearance is 32.1 mL/min (A) (by C-G formula based on SCr of 1.28 mg/dL (H)).  CMP Latest Ref Rng & Units 12/19/2018 09/14/2015 09/08/2015  Glucose 65 - 99 mg/dL 921(J) 941(D) 408(X)  BUN 8 - 27 mg/dL 20 14 7   Creatinine 0.57 - 1.00 mg/dL ) 4.48(J 8.56  Sodium 134 - 144 mmol/L 140 138 142  Potassium 3.5 - 5.2 mmol/L 4.5 4.3 3.8  Chloride 96 - 106 mmol/L 101 99 103  CO2 20 - 29 mmol/L 24 28 -  Calcium 8.7 - 10.3 mg/dL 9.2 9.7 -  Total Protein 6.0 - 8.5 g/dL 7.3 - -  Total Bilirubin 0.0 - 1.2 mg/dL 0.4 - -  Alkaline Phos 39 - 117 IU/L 59 - -  AST 0 - 40 IU/L 20 - -  ALT 0 - 32 IU/L 26 - -   CBC Latest Ref Rng & Units 09/14/2015 09/08/2015 09/08/2015  WBC 4.6 - 10.2 K/uL 7.1 - 7.5  Hemoglobin 12.2 - 16.2 g/dL 09/10/2015 16.3(H) 14.5  Hematocrit 37.7 - 47.9 % 43.3 48.0(H) 45.9  Platelets 150 - 400 K/uL - - 241   Lipid Panel     Component Value Date/Time   CHOL 134 12/19/2018 0841   TRIG 153 (H) 12/19/2018 0841   HDL 64 12/19/2018 0841   CHOLHDL 8.3 (H) 09/20/2015 1221   VLDL 45 (H) 09/20/2015 1221   LDLCALC 45 12/19/2018 0841   HEMOGLOBIN A1C Lab Results  Component Value Date   HGBA1C 6.6 (H) 12/19/2018   TSH Recent Labs    12/19/18 0841  TSH 3.930   Medications and allergies   Allergies  Allergen Reactions  . Aspirin Nausea And Vomiting     Current Outpatient Medications  Medication Instructions  . Alirocumab (PRALUENT) 150 mg, Subcutaneous, Every 14 days  . amLODipine (NORVASC) 5 MG tablet TAKE 1 TABLET(5 MG) BY MOUTH DAILY  . lisinopril-hydrochlorothiazide (ZESTORETIC) 20-25 MG tablet 0.5 tablets, Oral, Daily  . metoprolol succinate (TOPROL-XL) 50 mg, Oral, Daily  . PRALUENT 150 MG/ML SOAJ 150 mLs, Injection, Every 14 days  . rosuvastatin (CRESTOR) 10 MG tablet TAKE 1 TABLET BY MOUTH EVERY DAY AT NIGHT    Radiology:  No results  found. Cardiac Studies:    Lexiscan myoview stress test 11/01/2015: 1. The resting electrocardiogram demonstrated normal sinus rhythm, incomplete RBBB and no resting arrhythmias. Stress EKG is non-diagnostic for ischemia as it a pharmacologic stress using Lexiscan. Stress symptoms included dizziness. 2. Myocardial perfusion imaging is normal. Overall left ventricular systolic function was normal without regional wall motion abnormalities. The left ventricular ejection fraction was 59%.  Assessment     ICD-10-CM  1. Familial hyperlipidemia, high LDL  E78.49    09/20/2015: total cholesterol 455, triglycerides 224, HDL 55, LDL 355  2. Essential hypertension  I10 EKG 12-Lead  3. Hyperglycemia  R73.9   4. Xanthelasma of right lower eyelid  H02.62     EKG 12/28/2018: Normal sinus rhythm at rate of 66 bpm, normal axis.  No evidence of ischemia.  Nonspecific T abnormality.  Recommendations:  No orders of the defined types were placed in this encounter.   Amauria Younts  is a 73 y.o. [place] female with familial hyperlipidemia, LDL in July 2017 was 355. She is on low dose Crestor due to myalgias and fatigue which she is tolerating. She was started on Praluent in November 2017. She also has hyperglycemia, hypertension. Initially presented with symptoms suggestive of angina pectoris and symptoms have completey resolved.  She has had a negative nuclear stress test on 11/01/2015.   She now presents here for follow-up of hyperlipidemia and hypertension. Essentially asymptomatic.Accompanied by her daughter at the bedside. Lipids were reviewed, her son plasma as essentially almost resolved underneath her right eyelid.  Blood pressure is well controlled.  Hyperglycemia stable.  I'll see her back on an annual basis.  She has not had any recurrence of chest pain.  Adrian Prows, MD, [facility] 12/28/2018, 3:20 PM [facility] [place] Pager: [phone] Office: [phone] If no answer Cell [phone]

## 2019-01-02 ENCOUNTER — Other Ambulatory Visit: Payer: Self-pay

## 2019-01-02 ENCOUNTER — Ambulatory Visit (INDEPENDENT_AMBULATORY_CARE_PROVIDER_SITE_OTHER): Payer: Medicare Other | Admitting: Cardiology

## 2019-01-02 DIAGNOSIS — E7800 Pure hypercholesterolemia, unspecified: Secondary | ICD-10-CM | POA: Diagnosis not present

## 2019-01-02 MED ORDER — ALIROCUMAB 150 MG/ML ~~LOC~~ SOAJ
150.0000 mg | Freq: Once | SUBCUTANEOUS | Status: AC
  Administered 2019-01-02: 150 mg via SUBCUTANEOUS

## 2019-01-03 NOTE — Progress Notes (Signed)
Patient here for Praluent injection only 

## 2019-01-10 ENCOUNTER — Other Ambulatory Visit: Payer: Self-pay | Admitting: Cardiology

## 2019-01-16 ENCOUNTER — Other Ambulatory Visit: Payer: Self-pay

## 2019-01-16 ENCOUNTER — Ambulatory Visit (INDEPENDENT_AMBULATORY_CARE_PROVIDER_SITE_OTHER): Payer: Medicare Other | Admitting: Cardiology

## 2019-01-16 DIAGNOSIS — E7800 Pure hypercholesterolemia, unspecified: Secondary | ICD-10-CM

## 2019-01-16 MED ORDER — ALIROCUMAB 150 MG/ML ~~LOC~~ SOAJ
150.0000 mg | Freq: Once | SUBCUTANEOUS | Status: AC
  Administered 2019-01-16: 150 mg via SUBCUTANEOUS

## 2019-01-19 ENCOUNTER — Other Ambulatory Visit: Payer: Self-pay

## 2019-01-19 MED ORDER — PRALUENT 150 MG/ML ~~LOC~~ SOAJ: 150.0000 mL | SUBCUTANEOUS | 3 refills | Status: DC

## 2019-01-20 NOTE — Progress Notes (Signed)
Patient here for praluent injection only of her own prescription. Admin SUBQ into right anterior thigh. Tolerated well.

## 2019-01-30 ENCOUNTER — Other Ambulatory Visit: Payer: Self-pay

## 2019-01-30 ENCOUNTER — Encounter: Payer: Self-pay | Admitting: Cardiology

## 2019-01-30 ENCOUNTER — Ambulatory Visit (INDEPENDENT_AMBULATORY_CARE_PROVIDER_SITE_OTHER): Payer: Medicare Other | Admitting: Cardiology

## 2019-01-30 DIAGNOSIS — E7849 Other hyperlipidemia: Secondary | ICD-10-CM

## 2019-01-30 HISTORY — DX: Other hyperlipidemia: E78.49

## 2019-01-30 MED ORDER — ALIROCUMAB 150 MG/ML ~~LOC~~ SOAJ
150.0000 mg | Freq: Once | SUBCUTANEOUS | Status: AC
  Administered 2019-04-25: 150 mg via SUBCUTANEOUS

## 2019-01-30 MED ORDER — PRALUENT 150 MG/ML ~~LOC~~ SOAJ: 150.0000 mL | SUBCUTANEOUS | 3 refills | Status: DC

## 2019-02-13 ENCOUNTER — Other Ambulatory Visit: Payer: Self-pay

## 2019-02-13 ENCOUNTER — Ambulatory Visit: Payer: Medicare Other | Admitting: Cardiology

## 2019-02-13 DIAGNOSIS — E7849 Other hyperlipidemia: Secondary | ICD-10-CM

## 2019-02-13 MED ORDER — ALIROCUMAB 150 MG/ML ~~LOC~~ SOAJ
150.0000 mg | Freq: Once | SUBCUTANEOUS | Status: AC
  Administered 2019-02-13: 150 mg via SUBCUTANEOUS

## 2019-02-27 ENCOUNTER — Other Ambulatory Visit: Payer: Self-pay

## 2019-02-27 ENCOUNTER — Ambulatory Visit (INDEPENDENT_AMBULATORY_CARE_PROVIDER_SITE_OTHER): Payer: Medicare Other | Admitting: Cardiology

## 2019-02-27 DIAGNOSIS — E7849 Other hyperlipidemia: Secondary | ICD-10-CM

## 2019-02-27 MED ORDER — ALIROCUMAB 150 MG/ML ~~LOC~~ SOAJ
150.0000 mg | Freq: Once | SUBCUTANEOUS | Status: AC
  Administered 2019-02-27: 150 mg via SUBCUTANEOUS

## 2019-03-13 ENCOUNTER — Ambulatory Visit (INDEPENDENT_AMBULATORY_CARE_PROVIDER_SITE_OTHER): Payer: Medicare Other | Admitting: Cardiology

## 2019-03-13 ENCOUNTER — Other Ambulatory Visit: Payer: Self-pay

## 2019-03-13 DIAGNOSIS — E7800 Pure hypercholesterolemia, unspecified: Secondary | ICD-10-CM | POA: Diagnosis not present

## 2019-03-13 MED ORDER — ALIROCUMAB 150 MG/ML ~~LOC~~ SOAJ
150.0000 mg | Freq: Once | SUBCUTANEOUS | Status: AC
  Administered 2019-03-13: 13:00:00 150 mg via SUBCUTANEOUS

## 2019-03-13 NOTE — Progress Notes (Signed)
Admin Date  03/13/2019 13:28 Action  Given Dose  150 mg Route  Subcutaneous Site  Right Anterior Thigh Administered By  Eustaquio Boyden  Ordering Provider: Yates Decamp, MD  NDC: [phone]  Lot#: [id number]  Manufacturer: [employer] [place]  Patient Supplied?: No       ICD-10-CM   1. Pure hypercholesterolemia  E78.00 Alirocumab SOAJ 150 mg

## 2019-03-27 ENCOUNTER — Ambulatory Visit: Payer: Medicare Other | Admitting: Cardiology

## 2019-03-27 ENCOUNTER — Other Ambulatory Visit: Payer: Self-pay

## 2019-03-27 DIAGNOSIS — E7849 Other hyperlipidemia: Secondary | ICD-10-CM

## 2019-03-27 MED ORDER — ALIROCUMAB 150 MG/ML ~~LOC~~ SOAJ
150.0000 mg | Freq: Once | SUBCUTANEOUS | Status: AC
  Administered 2019-03-27: 16:00:00 150 mg via SUBCUTANEOUS

## 2019-04-04 DIAGNOSIS — H1811 Bullous keratopathy, right eye: Secondary | ICD-10-CM | POA: Diagnosis not present

## 2019-04-04 DIAGNOSIS — Z961 Presence of intraocular lens: Secondary | ICD-10-CM | POA: Diagnosis not present

## 2019-04-09 NOTE — Progress Notes (Signed)
Alirocumab SOAJ 150 mg  (suggestion)  The administrations shown are only for this specific order and not for other orders for the same medication that may be in this encounter.  Administration Action Time Recorded Time Documented By Site Comment Reason Patient Supplied  Given : 150 mg :  : Subcutaneous 03/27/19 1547 03/27/19 1548 Hatfield, Danielle L, CMA Left Anterior Thigh

## 2019-04-11 ENCOUNTER — Other Ambulatory Visit: Payer: Self-pay

## 2019-04-11 ENCOUNTER — Ambulatory Visit: Payer: Medicare Other | Admitting: Cardiology

## 2019-04-11 DIAGNOSIS — E7849 Other hyperlipidemia: Secondary | ICD-10-CM

## 2019-04-11 MED ORDER — ALIROCUMAB 150 MG/ML ~~LOC~~ SOAJ
150.0000 mg | Freq: Once | SUBCUTANEOUS | Status: AC
  Administered 2019-04-11: 150 mg via SUBCUTANEOUS

## 2019-04-11 MED ORDER — PRALUENT 150 MG/ML ~~LOC~~ SOAJ: 150.0000 mL | SUBCUTANEOUS | 3 refills | Status: DC

## 2019-04-25 ENCOUNTER — Ambulatory Visit (INDEPENDENT_AMBULATORY_CARE_PROVIDER_SITE_OTHER): Payer: Medicare Other | Admitting: Cardiology

## 2019-04-25 ENCOUNTER — Other Ambulatory Visit: Payer: Self-pay

## 2019-05-09 ENCOUNTER — Other Ambulatory Visit: Payer: Self-pay

## 2019-05-09 ENCOUNTER — Ambulatory Visit: Payer: Medicare Other | Admitting: Cardiology

## 2019-05-09 DIAGNOSIS — E7800 Pure hypercholesterolemia, unspecified: Secondary | ICD-10-CM | POA: Diagnosis not present

## 2019-05-09 MED ORDER — ALIROCUMAB 150 MG/ML ~~LOC~~ SOAJ
150.0000 mg | Freq: Once | SUBCUTANEOUS | Status: AC
  Administered 2019-05-09: 150 mg via SUBCUTANEOUS

## 2019-05-09 NOTE — Progress Notes (Signed)
Alirocumab SOAJ 150 mg  (suggestion)  The administrations shown are only for this specific order and not for other orders for the same medication that may be in this encounter.  Administration Action Time Recorded Time Documented By Site Comment Reason Patient Supplied  Given : 150 mg :  : Subcutaneous 05/09/19 1412 05/09/19 1413 Andrew Au, CMA Right Anterior Thigh   No   Yates Decamp, MD, [facility] 05/09/2019, 5:15 PM [facility] [place] Office: [phone]

## 2019-05-19 DIAGNOSIS — H1811 Bullous keratopathy, right eye: Secondary | ICD-10-CM | POA: Diagnosis not present

## 2019-05-23 ENCOUNTER — Other Ambulatory Visit: Payer: Self-pay

## 2019-05-23 ENCOUNTER — Ambulatory Visit: Payer: Medicare Other | Admitting: Cardiology

## 2019-05-23 DIAGNOSIS — E7800 Pure hypercholesterolemia, unspecified: Secondary | ICD-10-CM | POA: Diagnosis not present

## 2019-06-02 DIAGNOSIS — H1811 Bullous keratopathy, right eye: Secondary | ICD-10-CM | POA: Diagnosis not present

## 2019-06-06 ENCOUNTER — Other Ambulatory Visit: Payer: Self-pay

## 2019-06-06 ENCOUNTER — Ambulatory Visit: Payer: Medicare Other | Admitting: Cardiology

## 2019-06-06 DIAGNOSIS — E7849 Other hyperlipidemia: Secondary | ICD-10-CM

## 2019-06-08 NOTE — Progress Notes (Signed)
150 mg administered into right tight by Madelaine Bhat   Lot # [id number]  Ex 04-09-19        Diagnosis Association: Pure hypercholesterolemia (E78.00)    Order Status: Completed Tue Jun 06, 2019 1352, originally scheduled to end   NF Ordered: Yes NF post-verification: No Non-Formulary Code: Acceptable to use formulary alternative  Ordering User: Beryle Quant, CMA Ordering Date/Time: Thu Jun 30, 2018 1415  Ordering Provider: Yates Decamp, MD Authorizing Provider: Yates Decamp, MD

## 2019-06-16 DIAGNOSIS — H1811 Bullous keratopathy, right eye: Secondary | ICD-10-CM | POA: Diagnosis not present

## 2019-06-20 ENCOUNTER — Ambulatory Visit: Payer: Medicare Other | Admitting: Cardiology

## 2019-06-20 ENCOUNTER — Other Ambulatory Visit: Payer: Self-pay

## 2019-06-20 DIAGNOSIS — E7801 Familial hypercholesterolemia: Secondary | ICD-10-CM | POA: Diagnosis not present

## 2019-07-04 ENCOUNTER — Other Ambulatory Visit: Payer: Self-pay

## 2019-07-04 ENCOUNTER — Ambulatory Visit: Payer: Medicare Other | Admitting: Cardiology

## 2019-07-04 DIAGNOSIS — E7801 Familial hypercholesterolemia: Secondary | ICD-10-CM

## 2019-07-04 DIAGNOSIS — E7800 Pure hypercholesterolemia, unspecified: Secondary | ICD-10-CM

## 2019-07-04 MED ORDER — ALIROCUMAB 150 MG/ML ~~LOC~~ SOAJ
150.0000 mg | Freq: Once | SUBCUTANEOUS | Status: AC
  Administered 2019-07-04: 150 mg via SUBCUTANEOUS

## 2019-07-04 MED ORDER — PRALUENT 150 MG/ML ~~LOC~~ SOAJ: 150.0000 mL | SUBCUTANEOUS | 3 refills | Status: DC

## 2019-07-05 ENCOUNTER — Telehealth: Payer: Self-pay

## 2019-07-05 MED ORDER — PRALUENT 150 MG/ML ~~LOC~~ SOAJ: 150.0000 mL | SUBCUTANEOUS | 3 refills | Status: DC

## 2019-07-05 NOTE — Telephone Encounter (Signed)
Pt's daughter called stating her mother need a refill on Praluent. Send to [facility] on [address].  Please call pt to confirm medication.

## 2019-07-06 ENCOUNTER — Other Ambulatory Visit: Payer: Self-pay | Admitting: Cardiology

## 2019-07-06 DIAGNOSIS — H1811 Bullous keratopathy, right eye: Secondary | ICD-10-CM | POA: Diagnosis not present

## 2019-07-18 ENCOUNTER — Ambulatory Visit: Payer: Medicare Other | Admitting: Cardiology

## 2019-07-18 ENCOUNTER — Other Ambulatory Visit: Payer: Self-pay

## 2019-07-18 DIAGNOSIS — E7801 Familial hypercholesterolemia: Secondary | ICD-10-CM

## 2019-07-18 MED ORDER — ALIROCUMAB 150 MG/ML ~~LOC~~ SOAJ
150.0000 mg | Freq: Once | SUBCUTANEOUS | Status: AC
  Administered 2019-07-18: 150 mg via SUBCUTANEOUS

## 2019-07-18 NOTE — Progress Notes (Signed)
Administration Action Time Recorded Time Documented By Site Comment Reason Patient Supplied  Given : 150 mg :  : Subcutaneous 07/18/19 1351 07/18/19 1353 [place], [place], CMA Left Anterior Thigh 1 of 6 : 5 remaining.  Yes     ICD-10-CM   1. Familial hypercholesterolemia  E78.01 Alirocumab SOAJ 150 mg

## 2019-07-20 DIAGNOSIS — H1811 Bullous keratopathy, right eye: Secondary | ICD-10-CM | POA: Diagnosis not present

## 2019-08-01 ENCOUNTER — Ambulatory Visit: Payer: Medicare Other | Admitting: Cardiology

## 2019-08-01 ENCOUNTER — Other Ambulatory Visit: Payer: Self-pay

## 2019-08-01 DIAGNOSIS — E7801 Familial hypercholesterolemia: Secondary | ICD-10-CM | POA: Diagnosis not present

## 2019-08-02 MED ORDER — ALIROCUMAB 150 MG/ML ~~LOC~~ SOAJ
150.0000 mg | Freq: Once | SUBCUTANEOUS | Status: AC
  Administered 2019-08-02: 150 mg via SUBCUTANEOUS

## 2019-08-03 DIAGNOSIS — H02054 Trichiasis without entropian left upper eyelid: Secondary | ICD-10-CM | POA: Diagnosis not present

## 2019-08-03 DIAGNOSIS — H1811 Bullous keratopathy, right eye: Secondary | ICD-10-CM | POA: Diagnosis not present

## 2019-08-05 NOTE — Progress Notes (Signed)
Administration Action Time Recorded Time Documented By Site Comment Reason Patient Supplied  Given : 150 mg :  : Subcutaneous 08/02/19 0848 08/02/19 0849 Mares, Lesley Left Anterior Thigh per cecilia  No

## 2019-08-10 HISTORY — PX: CORNEAL TRANSPLANT: SHX108

## 2019-08-21 ENCOUNTER — Other Ambulatory Visit: Payer: Self-pay

## 2019-08-21 ENCOUNTER — Ambulatory Visit: Payer: Medicare Other | Admitting: Cardiology

## 2019-08-21 DIAGNOSIS — E7800 Pure hypercholesterolemia, unspecified: Secondary | ICD-10-CM | POA: Diagnosis not present

## 2019-08-21 DIAGNOSIS — E7849 Other hyperlipidemia: Secondary | ICD-10-CM | POA: Diagnosis not present

## 2019-08-21 MED ORDER — ALIROCUMAB 150 MG/ML ~~LOC~~ SOAJ
150.0000 mg | Freq: Once | SUBCUTANEOUS | Status: AC
  Administered 2019-08-21: 150 mg via SUBCUTANEOUS

## 2019-09-04 ENCOUNTER — Ambulatory Visit: Payer: Medicare Other | Admitting: Cardiology

## 2019-09-04 ENCOUNTER — Other Ambulatory Visit: Payer: Self-pay

## 2019-09-04 DIAGNOSIS — E7849 Other hyperlipidemia: Secondary | ICD-10-CM | POA: Diagnosis not present

## 2019-09-04 MED ORDER — ALIROCUMAB 150 MG/ML ~~LOC~~ SOAJ
150.0000 mg | Freq: Once | SUBCUTANEOUS | Status: AC
  Administered 2019-09-04: 150 mg via SUBCUTANEOUS

## 2019-09-04 NOTE — Progress Notes (Signed)
Administration Action Time Recorded Time Documented By Site Comment Reason Patient Supplied  Given : 150 mg :  : Subcutaneous 09/04/19 1531 09/04/19 1533 Andrew Au, CMA Right Anterior Thigh NDC [phone]  Yes     ICD-10-CM   1. Familial hyperlipidemia, high LDL  E78.49 Alirocumab SOAJ 150 mg     Yates Decamp, MD, [facility] 09/04/2019, 4:36 PM Office: [phone]

## 2019-09-06 DIAGNOSIS — H1820 Unspecified corneal edema: Secondary | ICD-10-CM | POA: Diagnosis not present

## 2019-09-06 DIAGNOSIS — H35362 Drusen (degenerative) of macula, left eye: Secondary | ICD-10-CM | POA: Diagnosis not present

## 2019-09-06 DIAGNOSIS — H2701 Aphakia, right eye: Secondary | ICD-10-CM | POA: Diagnosis not present

## 2019-09-18 ENCOUNTER — Other Ambulatory Visit: Payer: Self-pay

## 2019-09-18 ENCOUNTER — Ambulatory Visit: Payer: Medicare Other | Admitting: Cardiology

## 2019-09-18 DIAGNOSIS — E7849 Other hyperlipidemia: Secondary | ICD-10-CM

## 2019-09-18 MED ORDER — ALIROCUMAB 150 MG/ML ~~LOC~~ SOAJ
150.0000 mg | Freq: Once | SUBCUTANEOUS | Status: AC
  Administered 2019-09-18: 150 mg via SUBCUTANEOUS

## 2019-09-18 NOTE — Progress Notes (Signed)
Administration Action Time Recorded Time Documented By Site Comment Reason Patient Supplied  Given : 150 mg :  : Subcutaneous 09/18/19 1341 09/18/19 1343 Hatfield, Danielle L, CMA Left Anterior Thigh   Yes   Hyperlipidemia, familial, high LDL - Plan: Alirocumab SOAJ 150 mg

## 2019-09-22 DIAGNOSIS — H1820 Unspecified corneal edema: Secondary | ICD-10-CM | POA: Insufficient documentation

## 2019-10-02 ENCOUNTER — Other Ambulatory Visit: Payer: Self-pay

## 2019-10-02 ENCOUNTER — Ambulatory Visit: Payer: Medicare Other | Admitting: Cardiology

## 2019-10-02 DIAGNOSIS — E7849 Other hyperlipidemia: Secondary | ICD-10-CM | POA: Diagnosis not present

## 2019-10-02 MED ORDER — PRALUENT 150 MG/ML ~~LOC~~ SOAJ: 150.0000 mL | SUBCUTANEOUS | 1 refills | Status: DC

## 2019-10-02 MED ORDER — ALIROCUMAB 150 MG/ML ~~LOC~~ SOAJ
150.0000 mg | Freq: Once | SUBCUTANEOUS | Status: AC
  Administered 2019-10-02: 150 mg via SUBCUTANEOUS

## 2019-10-02 NOTE — Progress Notes (Signed)
Administration Action Time Recorded Time Documented By Site Comment Reason Patient Supplied  Given : 150 mg :  : Subcutaneous 10/02/19 1349 10/02/19 1351 Hatfield, Danielle L, CMA Right Anterior Thigh   Yes     ICD-10-CM   1. Hyperlipidemia, familial, high LDL  E78.49 Alirocumab SOAJ 150 mg     Yates Decamp, MD, [facility] 10/02/2019, 2:27 PM Office: [phone]

## 2019-10-05 ENCOUNTER — Telehealth: Payer: Self-pay | Admitting: Cardiology

## 2019-10-05 NOTE — Telephone Encounter (Signed)
Patient daughter phong called and requested a rx refill of Praluent says she only received a 30 day supply and would like to have a 90 day . Please call patient daughter at [phone] thanks

## 2019-10-06 ENCOUNTER — Other Ambulatory Visit: Payer: Self-pay

## 2019-10-06 MED ORDER — PRALUENT 150 MG/ML ~~LOC~~ SOAJ: 150.0000 mL | SUBCUTANEOUS | 1 refills | Status: DC

## 2019-10-06 NOTE — Telephone Encounter (Signed)
Done

## 2019-10-23 ENCOUNTER — Other Ambulatory Visit: Payer: Self-pay

## 2019-10-23 ENCOUNTER — Ambulatory Visit: Payer: Medicare Other | Admitting: Cardiology

## 2019-10-23 DIAGNOSIS — E7849 Other hyperlipidemia: Secondary | ICD-10-CM | POA: Diagnosis not present

## 2019-10-23 MED ORDER — ALIROCUMAB 150 MG/ML ~~LOC~~ SOAJ
150.0000 mg | Freq: Once | SUBCUTANEOUS | Status: AC
  Administered 2019-10-23: 150 mg via SUBCUTANEOUS

## 2019-10-23 NOTE — Progress Notes (Signed)
Administration Action Time Recorded Time Documented By Site Comment Reason Patient Supplied  Given : 150 mg :  : Subcutaneous 10/23/19 1354 10/23/19 1354 Mares, Lesley Left Anterior Thigh   No     ICD-10-CM   1. Hyperlipidemia, familial, high LDL  E78.49 Alirocumab SOAJ 150 mg     Yates Decamp, MD, [facility] 10/23/2019, 2:28 PM Office: [phone]

## 2019-11-03 DIAGNOSIS — H2701 Aphakia, right eye: Secondary | ICD-10-CM | POA: Diagnosis not present

## 2019-11-03 DIAGNOSIS — H1820 Unspecified corneal edema: Secondary | ICD-10-CM | POA: Diagnosis not present

## 2019-11-03 DIAGNOSIS — Z01818 Encounter for other preprocedural examination: Secondary | ICD-10-CM | POA: Diagnosis not present

## 2019-11-06 ENCOUNTER — Ambulatory Visit: Payer: Medicare Other | Admitting: Cardiology

## 2019-11-06 ENCOUNTER — Other Ambulatory Visit: Payer: Self-pay

## 2019-11-06 DIAGNOSIS — E7800 Pure hypercholesterolemia, unspecified: Secondary | ICD-10-CM

## 2019-11-06 MED ORDER — ALIROCUMAB 150 MG/ML ~~LOC~~ SOAJ
150.0000 mg | Freq: Once | SUBCUTANEOUS | Status: AC
  Administered 2019-11-06: 150 mg via SUBCUTANEOUS

## 2019-11-06 NOTE — Progress Notes (Signed)
Administration Action Time Recorded Time Documented By Site Comment Reason Patient Supplied  Given : 150 mg :  : Subcutaneous 11/06/19 1413 11/06/19 1413 Mares, Lesley Right Anterior Thigh   No     ICD-10-CM   1. Pure hypercholesterolemia  E78.00 Alirocumab SOAJ 150 mg     Yates Decamp, MD, [facility] 11/06/2019, 2:24 PM Office: [phone]

## 2019-11-09 DIAGNOSIS — H538 Other visual disturbances: Secondary | ICD-10-CM | POA: Diagnosis not present

## 2019-11-09 DIAGNOSIS — H1820 Unspecified corneal edema: Secondary | ICD-10-CM | POA: Diagnosis not present

## 2019-11-09 DIAGNOSIS — H2701 Aphakia, right eye: Secondary | ICD-10-CM | POA: Diagnosis not present

## 2019-11-10 DIAGNOSIS — Z48810 Encounter for surgical aftercare following surgery on the sense organs: Secondary | ICD-10-CM | POA: Diagnosis not present

## 2019-11-10 DIAGNOSIS — H35363 Drusen (degenerative) of macula, bilateral: Secondary | ICD-10-CM | POA: Diagnosis not present

## 2019-11-13 DIAGNOSIS — Z48810 Encounter for surgical aftercare following surgery on the sense organs: Secondary | ICD-10-CM | POA: Diagnosis not present

## 2019-11-13 DIAGNOSIS — Z23 Encounter for immunization: Secondary | ICD-10-CM | POA: Diagnosis not present

## 2019-11-13 DIAGNOSIS — H35363 Drusen (degenerative) of macula, bilateral: Secondary | ICD-10-CM | POA: Diagnosis not present

## 2019-11-13 DIAGNOSIS — Z947 Corneal transplant status: Secondary | ICD-10-CM | POA: Diagnosis not present

## 2019-11-15 DIAGNOSIS — Z23 Encounter for immunization: Secondary | ICD-10-CM | POA: Diagnosis not present

## 2019-11-20 ENCOUNTER — Other Ambulatory Visit: Payer: Self-pay

## 2019-11-20 ENCOUNTER — Ambulatory Visit: Payer: Medicare Other

## 2019-11-20 DIAGNOSIS — E7800 Pure hypercholesterolemia, unspecified: Secondary | ICD-10-CM

## 2019-11-20 DIAGNOSIS — E7849 Other hyperlipidemia: Secondary | ICD-10-CM

## 2019-11-20 MED ORDER — ALIROCUMAB 150 MG/ML ~~LOC~~ SOAJ
150.0000 mg | Freq: Once | SUBCUTANEOUS | Status: AC
  Administered 2020-03-04: 150 mg via SUBCUTANEOUS

## 2019-12-04 ENCOUNTER — Other Ambulatory Visit: Payer: Self-pay

## 2019-12-04 ENCOUNTER — Ambulatory Visit: Payer: Medicare Other | Admitting: Cardiology

## 2019-12-04 DIAGNOSIS — E7800 Pure hypercholesterolemia, unspecified: Secondary | ICD-10-CM

## 2019-12-04 MED ORDER — ALIROCUMAB 150 MG/ML ~~LOC~~ SOAJ
150.0000 mg | Freq: Once | SUBCUTANEOUS | Status: AC
  Administered 2019-12-04: 150 mg via SUBCUTANEOUS

## 2019-12-04 MED ORDER — ALIROCUMAB 150 MG/ML ~~LOC~~ SOAJ
150.0000 mg | Freq: Once | SUBCUTANEOUS | Status: AC
  Administered 2020-03-04: 150 mg via SUBCUTANEOUS

## 2019-12-04 NOTE — Progress Notes (Signed)
Administration Action Time Recorded Time Documented By Site Comment Reason Patient Supplied  Given : 150 mg :  : Subcutaneous 12/04/19 1345 12/04/19 1346 Obenshine, Tara L, CMA Right Anterior Thigh   Yes     ICD-10-CM   1. Pure hypercholesterolemia  E78.00 Alirocumab SOAJ 150 mg    Alirocumab SOAJ 150 mg     Yates Decamp, MD, [facility] 12/04/2019, 4:39 PM Office: [phone] Pager: [phone]

## 2019-12-18 ENCOUNTER — Ambulatory Visit: Payer: Medicare Other | Admitting: Cardiology

## 2019-12-18 ENCOUNTER — Other Ambulatory Visit: Payer: Self-pay

## 2019-12-18 DIAGNOSIS — E7800 Pure hypercholesterolemia, unspecified: Secondary | ICD-10-CM

## 2019-12-18 MED ORDER — ALIROCUMAB 150 MG/ML ~~LOC~~ SOAJ
150.0000 mg | Freq: Once | SUBCUTANEOUS | Status: AC
  Administered 2019-12-18: 150 mg via SUBCUTANEOUS

## 2019-12-18 NOTE — Progress Notes (Signed)
ICD-10-CM   1. Pure hypercholesterolemia  E78.00 Alirocumab SOAJ 150 mg   Administration Action Time Recorded Time Documented By Site Comment Reason Patient Supplied  Given : 150 mg :  : Subcutaneous 12/18/19 1327 12/18/19 1330 Erby Pian, [place] Left Anterior Thigh NDC [phone]  No     Yates Decamp, MD, [facility] 12/18/2019, 1:51 PM Office: [phone] Pager: [phone]

## 2019-12-28 ENCOUNTER — Ambulatory Visit: Payer: Medicare Other | Admitting: Cardiology

## 2020-01-01 ENCOUNTER — Other Ambulatory Visit: Payer: Self-pay | Admitting: Cardiology

## 2020-01-01 ENCOUNTER — Ambulatory Visit: Payer: Medicare Other | Admitting: Cardiology

## 2020-01-01 ENCOUNTER — Other Ambulatory Visit: Payer: Self-pay

## 2020-01-01 DIAGNOSIS — E7800 Pure hypercholesterolemia, unspecified: Secondary | ICD-10-CM | POA: Diagnosis not present

## 2020-01-01 MED ORDER — ALIROCUMAB 150 MG/ML ~~LOC~~ SOAJ
150.0000 mg | Freq: Once | SUBCUTANEOUS | Status: AC
  Administered 2020-01-01: 150 mg via SUBCUTANEOUS

## 2020-01-01 NOTE — Progress Notes (Signed)
Given : 150 mg :  : Subcutaneous 01/01/20 1349 01/01/20 1357 Erby Pian, CMA Right Anterior Thigh NCD : [phone]  Yes     ICD-10-CM   1. Pure hypercholesterolemia  E78.00 Alirocumab SOAJ 150 mg     Yates Decamp, MD, [facility] 01/01/2020, 4:04 PM Office: [phone] Pager: [phone]

## 2020-01-09 ENCOUNTER — Other Ambulatory Visit: Payer: Self-pay

## 2020-01-09 MED ORDER — PRALUENT 150 MG/ML ~~LOC~~ SOAJ
150.0000 mL | SUBCUTANEOUS | 1 refills | Status: DC
Start: 2020-01-09 — End: 2020-04-08

## 2020-01-15 ENCOUNTER — Other Ambulatory Visit: Payer: Self-pay | Admitting: Cardiology

## 2020-01-15 ENCOUNTER — Ambulatory Visit: Payer: Medicare Other | Admitting: Cardiology

## 2020-01-15 ENCOUNTER — Other Ambulatory Visit: Payer: Self-pay

## 2020-01-15 DIAGNOSIS — N1831 Chronic kidney disease, stage 3a: Secondary | ICD-10-CM | POA: Diagnosis not present

## 2020-01-15 DIAGNOSIS — I10 Essential (primary) hypertension: Secondary | ICD-10-CM | POA: Diagnosis not present

## 2020-01-15 DIAGNOSIS — E7800 Pure hypercholesterolemia, unspecified: Secondary | ICD-10-CM | POA: Diagnosis not present

## 2020-01-15 MED ORDER — ALIROCUMAB 150 MG/ML ~~LOC~~ SOAJ
150.0000 mg | Freq: Once | SUBCUTANEOUS | Status: AC
  Administered 2020-01-15: 150 mg via SUBCUTANEOUS

## 2020-01-15 NOTE — Progress Notes (Signed)
Administration Action Time Recorded Time Documented By Site Comment Reason Patient Supplied  Given : 150 mg :  : Subcutaneous 01/15/20 1359 01/15/20 1402 Erby Pian, CMA Left Anterior Thigh NDC [phone]  No     ICD-10-CM   1. Pure hypercholesterolemia  E78.00 Alirocumab SOAJ 150 mg

## 2020-01-16 LAB — BASIC METABOLIC PANEL
BUN/Creatinine Ratio: 17 (ref 12–28)
BUN: 21 mg/dL (ref 8–27)
CO2: 23 mmol/L (ref 20–29)
Calcium: 9.7 mg/dL (ref 8.7–10.3)
Chloride: 96 mmol/L (ref 96–106)
Creatinine, Ser: 1.23 mg/dL — ABNORMAL HIGH (ref 0.57–1.00)
GFR calc Af Amer: 50 mL/min/{1.73_m2} — ABNORMAL LOW (ref >59)
GFR calc non Af Amer: 43 mL/min/{1.73_m2} — ABNORMAL LOW (ref >59)
Glucose: 116 mg/dL — ABNORMAL HIGH (ref 65–99)
Potassium: 4.8 mmol/L (ref 3.5–5.2)
Sodium: 143 mmol/L (ref 134–144)

## 2020-01-16 NOTE — Progress Notes (Signed)
Stable stage 3a CKD

## 2020-01-22 ENCOUNTER — Ambulatory Visit: Payer: Medicare Other | Admitting: Cardiology

## 2020-01-22 ENCOUNTER — Ambulatory Visit: Payer: Medicare Other

## 2020-01-22 ENCOUNTER — Other Ambulatory Visit: Payer: Self-pay

## 2020-01-22 ENCOUNTER — Encounter: Payer: Self-pay | Admitting: Cardiology

## 2020-01-22 VITALS — BP 112/50 | HR 55 | Resp 16 | Ht 60.0 in | Wt 116.0 lb

## 2020-01-22 DIAGNOSIS — I10 Essential (primary) hypertension: Secondary | ICD-10-CM | POA: Diagnosis not present

## 2020-01-22 DIAGNOSIS — H0262 Xanthelasma of right lower eyelid: Secondary | ICD-10-CM

## 2020-01-22 DIAGNOSIS — E7801 Familial hypercholesterolemia: Secondary | ICD-10-CM

## 2020-01-22 DIAGNOSIS — N1831 Chronic kidney disease, stage 3a: Secondary | ICD-10-CM | POA: Diagnosis not present

## 2020-01-22 NOTE — Progress Notes (Signed)
Primary Physician/Referring:  Sherren Mocha, MD  Patient ID: Becky Turner, female    DOB: 24-Jan-1946, 74 y.o.   MRN: [id number]  Chief Complaint  Patient presents with  . Hypertension  . Hyperlipidemia  . Follow-up   HPI:    Becky Turner  is a 74 y.o. [place] female with familial hyperlipidemia, LDL in July 2017 was 355. She is on low dose Crestor due to myalgias and fatigue which she is tolerating. She was started on Praluent in November 2017. She also has hyperglycemia, hypertension and stage III chronic kidney disease. Initially presented with symptoms suggestive of angina pectoris and symptoms have completey resolved.  She has had a negative nuclear stress test on 11/01/2015.   She now presents here for follow-up of hyperlipidemia and hypertension. Essentially asymptomatic.Accompanied by her daughter at the bedside.  Past Medical History:  Diagnosis Date  . Hyperlipidemia   . Hyperlipidemia, familial, high LDL 01/30/2019  . Hypertension     History reviewed. No pertinent surgical history.   Social History   Tobacco Use  . Smoking status: Never Smoker  . Smokeless tobacco: Never Used  Substance Use Topics  . Alcohol use: No  Marital Status: Married   ROS  Review of Systems  Cardiovascular: Negative for chest pain, dyspnea on exertion and leg swelling.  Gastrointestinal: Negative for melena.   Objective   Vitals with BMI 01/22/2020 12/28/2018 10/03/2016  Height 5\' 0"  5\' 0"  5' 0.5"  Weight 116 lbs 136 lbs 139 lbs 13 oz  BMI 22.65 26.56 26.84  Systolic 112 128  Diastolic 50 76 70  Pulse 55 72 69    Physical Exam Constitutional:      Comments: She is moderately built and petite in no acute distress  HENT:     Head: Atraumatic.  Eyes:     Conjunctiva/sclera: Conjunctivae normal.     Comments: Right eyelid xanthelasma noted  Neck:     Thyroid: No thyromegaly.     Vascular: No JVD.  Cardiovascular:     Rate and Rhythm: Normal rate and regular rhythm.      Pulses: Normal pulses and intact distal pulses.     Heart sounds: Normal heart sounds. No murmur heard. No gallop.      Comments: No leg edema, no JVD. Pulmonary:     Effort: Pulmonary effort is normal.     Breath sounds: Normal breath sounds.  Abdominal:     General: Bowel sounds are normal.     Palpations: Abdomen is soft.  Musculoskeletal:        General: Normal range of motion.     Cervical back: Neck supple.  Skin:    General: Skin is warm and dry.  Neurological:     Mental Status: She is alert.    Laboratory examination:    Recent Labs    01/15/20 0827  NA 143  K 4.8  CL 96  CO2 23  GLUCOSE 116*  BUN 21  CREATININE 1.23*  CALCIUM 9.7  GFRNONAA 43*  GFRAA 50*   estimated creatinine clearance is 28.8 mL/min (A) (by C-G formula based on SCr of 1.23 mg/dL (H)).  CMP Latest Ref Rng & Units 01/15/2020 12/19/2018 09/14/2015  Glucose 65 - 99 mg/dL 13/10/2018) 11/14/2015) 630(Z)  BUN 8 - 27 mg/dL 21 20 14   Creatinine 0.57 - 1.00 mg/dL 601(U) 932(T)  Sodium 134 - 144 mmol/L 143 140 138  Potassium 3.5 - 5.2 mmol/L 4.8 4.5 4.3  Chloride 96 -  106 mmol/L 96 101 99  CO2 20 - 29 mmol/L 23 24 28   Calcium 8.7 - 10.3 mg/dL 9.7 9.2 9.7  Total Protein 6.0 - 8.5 g/dL - 7.3 -  Total Bilirubin 0.0 - 1.2 mg/dL - 0.4 -  Alkaline Phos 39 - 117 IU/L - 59 -  AST 0 - 40 IU/L - 20 -  ALT 0 - 32 IU/L - 26 -   CBC Latest Ref Rng & Units 09/14/2015 09/08/2015 09/08/2015  WBC 4.6 - 10.2 K/uL 7.1 - 7.5  Hemoglobin 12.2 - 16.2 g/dL 09/10/2015 16.3(H) 14.5  Hematocrit 37.7 - 47.9 % 43.3 48.0(H) 45.9  Platelets 150 - 400 K/uL - - 241     Lipid Panel     Component Value Date/Time   CHOL 134 12/19/2018 0841   TRIG 153 (H) 12/19/2018 0841   HDL 64 12/19/2018 0841   CHOLHDL 8.3 (H) 09/20/2015 1221   VLDL 45 (H) 09/20/2015 1221   LDLCALC 45 12/19/2018 0841   HEMOGLOBIN A1C Lab Results  Component Value Date   HGBA1C 6.6 (H) 12/19/2018   TSH No results for input(s): TSH in the last 8760  hours. Medications and allergies   Allergies  Allergen Reactions  . Aspirin Nausea And Vomiting    Current Outpatient Medications on File Prior to Visit  Medication Sig Dispense Refill  . amLODipine (NORVASC) 5 MG tablet TAKE 1 TABLET(5 MG) BY MOUTH DAILY 90 tablet 2  . lisinopril-hydrochlorothiazide (ZESTORETIC) 20-25 MG tablet TAKE 1 TABLET BY MOUTH DAILY 90 tablet 2  . metoprolol succinate (TOPROL-XL) 50 MG 24 hr tablet TAKE 1 TABLET BY MOUTH DAILY 90 tablet 1  . PRALUENT 150 MG/ML SOAJ Inject 150 mLs as directed every 14 (fourteen) days. 6 mL 1  . rosuvastatin (CRESTOR) 10 MG tablet TAKE 1 TABLET BY MOUTH EVERY NIGHT 90 tablet 1   Current Facility-Administered Medications on File Prior to Visit  Medication Dose Route Frequency Provider Last Rate Last Admin  . Alirocumab SOAJ 150 mg  150 mg Subcutaneous Once 13/10/2018, MD      . Alirocumab SOAJ 150 mg  150 mg Subcutaneous Once Yates Decamp, MD         Radiology:  No results found. Cardiac Studies:    Lexiscan myoview stress test 11/01/2015: 1. The resting electrocardiogram demonstrated normal sinus rhythm, incomplete RBBB and no resting arrhythmias. Stress EKG is non-diagnostic for ischemia as it a pharmacologic stress using Lexiscan. Stress symptoms included dizziness. 2. Myocardial perfusion imaging is normal. Overall left ventricular systolic function was normal without regional wall motion abnormalities. The left ventricular ejection fraction was 59%.  EKG:   EKG 1213 2021: Normal sinus rhythm at the rate of 58 bpm, left atrial enlargement, normal axis. Incomplete right bundle branch block. No evidence of ischemia.  EKG 12/28/2018: Normal sinus rhythm at rate of 66 bpm, normal axis.  No evidence of ischemia.  Nonspecific T abnormality.  Assessment     ICD-10-CM   1. Essential hypertension  I10 EKG 12-Lead  2. Familial hypercholesterolemia  E78.01 Lipid Panel With LDL/HDL Ratio  3. Xanthelasma of right lower eyelid   H02.62   4. Stage 3a chronic kidney disease (HCC)  N18.31     No orders of the defined types were placed in this encounter. There are no discontinued medications.   Recommendations:   Becky Turner  is a 74 y.o. 66 female with familial hyperlipidemia, LDL in July 2017 was 355. She is on low dose Crestor due to  myalgias and fatigue which she is tolerating. She was started on Praluent in November 2017. She also has hyperglycemia, hypertension and stage III chronic kidney disease. Initially presented with symptoms suggestive of angina pectoris and symptoms have completey resolved.  She has had a negative nuclear stress test on 11/01/2015.   She now presents here for follow-up of hyperlipidemia and hypertension. Essentially asymptomatic.Accompanied by her daughter at the bedside. Lipids were reviewed, her son plasma as essentially almost resolved underneath her right eyelid.  Blood pressure is well controlled. She will need lipid profile testing. She has stable stage III chronic kidney disease. I'll see her back on an annual basis.  She has not had any recurrence of chest pain.   Yates Decamp, MD, [facility] 01/22/2020, 2:24 PM Office: [phone] Pager: [phone]

## 2020-01-29 ENCOUNTER — Other Ambulatory Visit: Payer: Self-pay

## 2020-01-29 ENCOUNTER — Ambulatory Visit: Payer: Medicare Other | Admitting: Cardiology

## 2020-01-29 DIAGNOSIS — E7801 Familial hypercholesterolemia: Secondary | ICD-10-CM

## 2020-01-29 MED ORDER — ALIROCUMAB 150 MG/ML ~~LOC~~ SOAJ
150.0000 mg | Freq: Once | SUBCUTANEOUS | Status: AC
  Administered 2020-01-29: 14:00:00 150 mg via SUBCUTANEOUS

## 2020-01-29 NOTE — Progress Notes (Signed)
ICD-10-CM   1. Familial hypercholesterolemia  E78.01 Alirocumab SOAJ 150 mg    Administration Action Time Recorded Time Documented By Site Comment Reason Patient Supplied  Given : 150 mg :  : Subcutaneous 01/29/20 1338 01/29/20 1340 [id number] [place] Right Anterior Thigh NDC [phone]  Yes    Yates Decamp, MD, [facility] 01/29/2020, 4:41 PM Office: [phone] Pager: [phone]

## 2020-01-30 LAB — LIPID PANEL WITH LDL/HDL RATIO
Cholesterol, Total: 156 mg/dL (ref 100–199)
HDL: 65 mg/dL (ref >39)
LDL Chol Calc (NIH): 62 mg/dL (ref 0–99)
LDL/HDL Ratio: 1 ratio (ref 0.0–3.2)
Triglycerides: 178 mg/dL — ABNORMAL HIGH (ref 0–149)
VLDL Cholesterol Cal: 29 mg/dL (ref 5–40)

## 2020-01-31 NOTE — Progress Notes (Signed)
Called patient, NA, LMAM

## 2020-02-12 ENCOUNTER — Ambulatory Visit: Payer: Medicare Other | Admitting: Cardiology

## 2020-02-12 ENCOUNTER — Other Ambulatory Visit: Payer: Self-pay

## 2020-02-12 DIAGNOSIS — E7801 Familial hypercholesterolemia: Secondary | ICD-10-CM | POA: Diagnosis not present

## 2020-02-12 MED ORDER — ALIROCUMAB 150 MG/ML ~~LOC~~ SOAJ
150.0000 mg | Freq: Once | SUBCUTANEOUS | Status: AC
  Administered 2020-02-12: 150 mg via SUBCUTANEOUS

## 2020-02-12 NOTE — Progress Notes (Signed)
Administration Action Time Recorded Time Documented By Site Comment Reason Patient Supplied  Given : 150 mg :  : Subcutaneous 02/12/20 1119 02/12/20 1121 Erby Pian, [place] Left Anterior Thigh NDC [phone]  Yes     ICD-10-CM   1. Familial hypercholesterolemia  E78.01 Alirocumab SOAJ 150 mg

## 2020-02-12 NOTE — Progress Notes (Signed)
Patient will be in later today for her Praluent injection. I will give her lab results then.

## 2020-02-26 ENCOUNTER — Ambulatory Visit: Payer: Medicare Other

## 2020-03-04 ENCOUNTER — Ambulatory Visit: Payer: Medicare Other

## 2020-03-04 ENCOUNTER — Ambulatory Visit: Payer: Self-pay | Admitting: Cardiology

## 2020-03-04 ENCOUNTER — Other Ambulatory Visit: Payer: Self-pay

## 2020-03-04 DIAGNOSIS — E7801 Familial hypercholesterolemia: Secondary | ICD-10-CM

## 2020-03-18 ENCOUNTER — Other Ambulatory Visit: Payer: Self-pay

## 2020-03-18 ENCOUNTER — Ambulatory Visit: Payer: Medicare Other | Admitting: Cardiology

## 2020-03-18 DIAGNOSIS — E7801 Familial hypercholesterolemia: Secondary | ICD-10-CM

## 2020-03-18 MED ORDER — ALIROCUMAB 150 MG/ML ~~LOC~~ SOAJ
150.0000 mg | Freq: Once | SUBCUTANEOUS | Status: AC
  Administered 2020-03-18: 150 mg via SUBCUTANEOUS

## 2020-03-18 NOTE — Progress Notes (Signed)
  Administration Action Time Recorded Time Documented By Site Comment Reason Patient Supplied  Given : 150 mg :  : Subcutaneous 03/18/20 1546 03/18/20 1557 Erby Pian, CMA Left Anterior Thigh [phone]  Yes       ICD-10-CM   1. Familial hypercholesterolemia  E78.01 Alirocumab SOAJ 150 mg    Yates Decamp, MD, [facility] 03/18/2020, 5:06 PM Office: [phone] Pager: [phone]

## 2020-03-22 DIAGNOSIS — Z947 Corneal transplant status: Secondary | ICD-10-CM | POA: Diagnosis not present

## 2020-03-22 DIAGNOSIS — H35362 Drusen (degenerative) of macula, left eye: Secondary | ICD-10-CM | POA: Diagnosis not present

## 2020-03-22 DIAGNOSIS — Z961 Presence of intraocular lens: Secondary | ICD-10-CM | POA: Diagnosis not present

## 2020-03-22 DIAGNOSIS — H43313 Vitreous membranes and strands, bilateral: Secondary | ICD-10-CM | POA: Diagnosis not present

## 2020-03-31 ENCOUNTER — Other Ambulatory Visit: Payer: Self-pay | Admitting: Cardiology

## 2020-04-01 ENCOUNTER — Ambulatory Visit: Payer: Medicare Other | Admitting: Cardiology

## 2020-04-01 ENCOUNTER — Other Ambulatory Visit: Payer: Self-pay

## 2020-04-01 DIAGNOSIS — E7801 Familial hypercholesterolemia: Secondary | ICD-10-CM | POA: Diagnosis not present

## 2020-04-02 MED ORDER — ALIROCUMAB 150 MG/ML ~~LOC~~ SOAJ
150.0000 mg | Freq: Once | SUBCUTANEOUS | Status: AC
  Administered 2020-04-02: 150 mg via SUBCUTANEOUS

## 2020-04-02 NOTE — Progress Notes (Signed)
Administration Action Time Recorded Time Documented By Site Comment Reason Patient Supplied  Given : 150 mg :  : Subcutaneous 04/02/20 1158 04/02/20 1200 [place], [facility] Right Anterior Thigh   No      ICD-10-CM   1. Familial hypercholesterolemia  E78.01 Alirocumab SOAJ 150 mg    Yates Decamp, MD, [facility] 04/02/2020, 8:29 PM Office: [phone] Pager: [phone]

## 2020-04-08 ENCOUNTER — Other Ambulatory Visit: Payer: Self-pay

## 2020-04-08 MED ORDER — PRALUENT 150 MG/ML ~~LOC~~ SOAJ: 150.0000 mL | SUBCUTANEOUS | 1 refills | Status: DC

## 2020-04-08 MED ORDER — METOPROLOL SUCCINATE ER 50 MG PO TB24: 50.0000 mg | ORAL_TABLET | Freq: Every day | ORAL | 1 refills | Status: DC

## 2020-04-15 ENCOUNTER — Ambulatory Visit: Payer: Self-pay | Admitting: Cardiology

## 2020-04-15 ENCOUNTER — Other Ambulatory Visit: Payer: Self-pay

## 2020-04-15 MED ORDER — PRALUENT 150 MG/ML ~~LOC~~ SOAJ: 150.0000 mL | SUBCUTANEOUS | 1 refills | Status: DC

## 2020-04-16 ENCOUNTER — Other Ambulatory Visit: Payer: Self-pay

## 2020-04-16 ENCOUNTER — Ambulatory Visit: Payer: Medicare Other

## 2020-04-22 ENCOUNTER — Ambulatory Visit: Payer: Medicare Other

## 2020-04-29 ENCOUNTER — Ambulatory Visit: Payer: Medicare Other | Admitting: Cardiology

## 2020-04-29 ENCOUNTER — Other Ambulatory Visit: Payer: Self-pay

## 2020-04-29 DIAGNOSIS — E7849 Other hyperlipidemia: Secondary | ICD-10-CM | POA: Diagnosis not present

## 2020-04-29 MED ORDER — ALIROCUMAB 150 MG/ML ~~LOC~~ SOAJ
150.0000 mg | Freq: Once | SUBCUTANEOUS | Status: AC
  Administered 2020-04-29: 150 mg via SUBCUTANEOUS

## 2020-04-29 NOTE — Progress Notes (Signed)
ICD-10-CM   1. Hyperlipidemia, familial, high LDL  E78.49 Alirocumab SOAJ 150 mg    Administration Action Time Recorded Time Documented By Site Comment Reason Patient Supplied  Given : 150 mg :  : Subcutaneous 04/29/20 1357 04/29/20 1358 Dodd, Amber L Right Anterior Thigh NDC [id number]  No     Yates Decamp, MD, [facility] 04/29/2020, 5:28 PM Office: [phone] Pager: [phone]

## 2020-05-06 DIAGNOSIS — Z961 Presence of intraocular lens: Secondary | ICD-10-CM | POA: Diagnosis not present

## 2020-05-06 DIAGNOSIS — H35362 Drusen (degenerative) of macula, left eye: Secondary | ICD-10-CM | POA: Diagnosis not present

## 2020-05-06 DIAGNOSIS — Z947 Corneal transplant status: Secondary | ICD-10-CM | POA: Diagnosis not present

## 2020-05-13 ENCOUNTER — Ambulatory Visit: Payer: Medicare Other | Admitting: Cardiology

## 2020-05-13 ENCOUNTER — Other Ambulatory Visit: Payer: Self-pay

## 2020-05-13 DIAGNOSIS — E7800 Pure hypercholesterolemia, unspecified: Secondary | ICD-10-CM | POA: Diagnosis not present

## 2020-05-13 MED ORDER — ALIROCUMAB 150 MG/ML ~~LOC~~ SOAJ
150.0000 mg | Freq: Once | SUBCUTANEOUS | Status: AC
Start: 2020-05-13 — End: 2020-05-13
  Administered 2020-05-13: 150 mg via SUBCUTANEOUS

## 2020-05-13 NOTE — Progress Notes (Signed)
ICD-10-CM   1. Pure hypercholesterolemia  E78.00 Alirocumab SOAJ 150 mg   Administration Action Time Recorded Time Documented By Site Comment Reason Patient Supplied  Given : 150 mg :  : Subcutaneous 05/13/20 1400 05/13/20 1423 Obenshine, Tara L, CMA Left Anterior Thigh ndc [phone]  Yes

## 2020-05-27 ENCOUNTER — Other Ambulatory Visit: Payer: Self-pay

## 2020-05-27 ENCOUNTER — Ambulatory Visit: Payer: Medicare Other | Admitting: Student

## 2020-05-27 DIAGNOSIS — E7849 Other hyperlipidemia: Secondary | ICD-10-CM | POA: Diagnosis not present

## 2020-05-27 MED ORDER — ALIROCUMAB 150 MG/ML ~~LOC~~ SOAJ
150.0000 mg | Freq: Once | SUBCUTANEOUS | Status: AC
  Administered 2020-05-27: 150 mg via SUBCUTANEOUS

## 2020-06-06 NOTE — Progress Notes (Signed)
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 Hyperlipidemia, familial, high LDL  E78.49 Alirocumab SOAJ 150 mg    Rayford Halsted, PA-C 06/06/2020, 4:15 PM Office: [phone]

## 2020-06-10 ENCOUNTER — Other Ambulatory Visit: Payer: Self-pay

## 2020-06-10 ENCOUNTER — Ambulatory Visit: Payer: Medicare Other | Admitting: Student

## 2020-06-10 DIAGNOSIS — E7801 Familial hypercholesterolemia: Secondary | ICD-10-CM | POA: Diagnosis not present

## 2020-06-10 DIAGNOSIS — E7849 Other hyperlipidemia: Secondary | ICD-10-CM

## 2020-06-10 MED ORDER — ALIROCUMAB 150 MG/ML ~~LOC~~ SOAJ
150.0000 mg | Freq: Once | SUBCUTANEOUS | Status: AC
Start: 2020-06-10 — End: 2020-06-10
  Administered 2020-06-10: 150 mg via SUBCUTANEOUS

## 2020-06-18 NOTE — Progress Notes (Signed)
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

## 2020-06-24 ENCOUNTER — Other Ambulatory Visit: Payer: Self-pay

## 2020-06-24 ENCOUNTER — Ambulatory Visit: Payer: Medicare Other | Admitting: Student

## 2020-06-24 DIAGNOSIS — E7801 Familial hypercholesterolemia: Secondary | ICD-10-CM

## 2020-06-24 MED ORDER — PRALUENT 150 MG/ML ~~LOC~~ SOAJ: 150.0000 mL | SUBCUTANEOUS | 1 refills | Status: DC

## 2020-06-24 MED ORDER — ALIROCUMAB 150 MG/ML ~~LOC~~ SOAJ
150.0000 mg | Freq: Once | SUBCUTANEOUS | Status: AC
  Administered 2020-06-24: 150 mg via SUBCUTANEOUS

## 2020-06-25 NOTE — Progress Notes (Signed)
Alirocumab SOAJ 150 mg  [[id number]]  Ordered Dose: 150 mg Route: Subcutaneous Frequency: Once  Admin Dose: --     Scheduled Start Date/Time: 06/24/20 1400 End Date/Time: 06/24/20 1354 after 1 doses       Diagnosis Association: Familial hypercholesterolemia (E78.01)    Order Status: Completed Mon Jun 24, 2020 1354, originally scheduled to end   NF Ordered: Yes NF post-verification: No Non-Formulary Code: Acceptable to use formulary alternative  Ordering User: Eustaquio Boyden Ordering Date/Time: Mon Jun 24, 2020 1354  Ordering Provider: Yates Decamp, MD Authorizing Provider: Yates Decamp, MD     ICD-10-CM   1. Familial hypercholesterolemia  E78.01 Alirocumab SOAJ 150 mg     Rayford Halsted, PA-C 06/25/2020, 8:44 AM Office: [phone]

## 2020-06-29 ENCOUNTER — Other Ambulatory Visit: Payer: Self-pay | Admitting: Cardiology

## 2020-07-01 ENCOUNTER — Other Ambulatory Visit: Payer: Self-pay

## 2020-07-15 ENCOUNTER — Ambulatory Visit: Payer: Medicare Other | Admitting: Cardiology

## 2020-07-15 ENCOUNTER — Other Ambulatory Visit: Payer: Self-pay

## 2020-07-15 DIAGNOSIS — E7800 Pure hypercholesterolemia, unspecified: Secondary | ICD-10-CM

## 2020-07-15 MED ORDER — ALIROCUMAB 150 MG/ML ~~LOC~~ SOAJ
150.0000 mg | Freq: Once | SUBCUTANEOUS | Status: AC
  Administered 2020-07-15: 150 mg via SUBCUTANEOUS

## 2020-07-15 NOTE — Progress Notes (Signed)
ICD-10-CM   1. Pure hypercholesterolemia  E78.00 Alirocumab SOAJ 150 mg   Administration Action Time Recorded Time Documented By Site Comment Reason Patient Supplied  Given : 150 mg :  : Subcutaneous 07/15/20 1406 07/15/20 1408 Dodd, Amber L Left Anterior Thigh NDC [id number]  No     Yates Decamp, MD, [facility] 07/15/2020, 5:21 PM Office: [phone] Fax: [phone] Pager: [phone]

## 2020-07-16 ENCOUNTER — Other Ambulatory Visit: Payer: Self-pay

## 2020-07-16 ENCOUNTER — Encounter (HOSPITAL_COMMUNITY): Payer: Self-pay

## 2020-07-16 ENCOUNTER — Ambulatory Visit (HOSPITAL_COMMUNITY)
Admission: EM | Admit: 2020-07-16 | Discharge: 2020-07-16 | Disposition: A | Discharge status: Home / Self Care | Payer: Medicare Other | Attending: Urgent Care | Admitting: Urgent Care

## 2020-07-16 DIAGNOSIS — R1010 Upper abdominal pain, unspecified: Secondary | ICD-10-CM

## 2020-07-16 DIAGNOSIS — R1013 Epigastric pain: Secondary | ICD-10-CM

## 2020-07-16 DIAGNOSIS — R0789 Other chest pain: Secondary | ICD-10-CM | POA: Diagnosis not present

## 2020-07-16 DIAGNOSIS — K5900 Constipation, unspecified: Secondary | ICD-10-CM | POA: Diagnosis not present

## 2020-07-16 MED ORDER — OMEPRAZOLE 20 MG PO CPDR: 20.0000 mg | DELAYED_RELEASE_CAPSULE | Freq: Every day | ORAL | 0 refills | Status: DC

## 2020-07-16 NOTE — ED Provider Notes (Signed)
Becky [facility]   MRN: [id number] DOB: 1945-10-28  Subjective:   Becky Turner is a 75 y.o. female presenting for 1 month history of persistent mid lower sternal chest pain that radiates down into her abdomen. Has felt abdominal bloating, has difficulty eating when she feels this way. Has a bowel movement every 3-4 days, constipation is newer over the past year. Has had vomiting from her intermittently from feeling dizzy as well. Has occasional dizziness when she stands. Denies history of heart disease, diabetes. Has a history of HTN, HL. Takes medications for this. Has a PCP, has not seen them for a year. Has never had a colonoscopy, endoscopy. Denies smoking cigarettes. Denies fever, bloody stools.   No current facility-administered medications for this encounter.  Current Outpatient Medications:  .  amLODipine (NORVASC) 5 MG tablet, TAKE 1 TABLET(5 MG) BY MOUTH DAILY, Disp: 90 tablet, Rfl: 2 .  lisinopril-hydrochlorothiazide (ZESTORETIC) 20-25 MG tablet, TAKE 1 TABLET BY MOUTH DAILY, Disp: 90 tablet, Rfl: 2 .  metoprolol succinate (TOPROL-XL) 50 MG 24 hr tablet, TAKE 1 TABLET BY MOUTH DAILY, Disp: 90 tablet, Rfl: 1 .  PRALUENT 150 MG/ML SOAJ, Inject 150 mLs as directed every 14 (fourteen) days., Disp: 6 mL, Rfl: 1 .  rosuvastatin (CRESTOR) 10 MG tablet, TAKE 1 TABLET BY MOUTH EVERY NIGHT, Disp: 90 tablet, Rfl: 1   Allergies  Allergen Reactions  . Aspirin Nausea And Vomiting    Past Medical History:  Diagnosis Date  . Hyperlipidemia   . Hyperlipidemia, familial, high LDL 01/30/2019  . Hypertension      History reviewed. No pertinent surgical history.  History reviewed. No pertinent family history.  Social History   Tobacco Use  . Smoking status: Never Smoker  . Smokeless tobacco: Never Used  Substance Use Topics  . Alcohol use: No  . Drug use: No    ROS   Objective:   Vitals: BP 119/85 (BP Location: Right Arm)   Pulse 93   Temp 98.1 F (36.7 C)  (Oral)   Resp 17   SpO2 100%   Physical Exam Constitutional:      General: She is not in acute distress.    Appearance: Normal appearance. She is well-developed and normal weight. She is not ill-appearing, toxic-appearing or diaphoretic.  HENT:     Head: Normocephalic and atraumatic.     Right Ear: External ear normal.     Left Ear: External ear normal.     Nose: Nose normal.     Mouth/Throat:     Mouth: Mucous membranes are moist.     Pharynx: Oropharynx is clear.  Eyes:     General: No scleral icterus.    Extraocular Movements: Extraocular movements intact.     Pupils: Pupils are equal, round, and reactive to light.  Cardiovascular:     Rate and Rhythm: Normal rate and regular rhythm.     Pulses: Normal pulses.     Heart sounds: Normal heart sounds. No murmur heard. No friction rub. No gallop.   Pulmonary:     Effort: Pulmonary effort is normal. No respiratory distress.     Breath sounds: Normal breath sounds. No stridor. No wheezing, rhonchi or rales.  Abdominal:     General: Bowel sounds are normal. There is no distension.     Palpations: Abdomen is soft. There is no mass.     Tenderness: There is abdominal tenderness (upper, epigastric). There is no right CVA tenderness, left CVA tenderness, guarding or rebound.  Skin:    General: Skin is warm and dry.     Coloration: Skin is not pale.     Findings: No rash.  Neurological:     General: No focal deficit present.     Mental Status: She is alert and oriented to person, place, and time.  Psychiatric:        Mood and Affect: Mood normal.        Behavior: Behavior normal.        Thought Content: Thought content normal.        Judgment: Judgment normal.     ED ECG REPORT   Date: 07/16/2020  Rate: 72bpm  Rhythm: normal sinus rhythm  QRS Axis: normal  Intervals: normal  ST/T Wave abnormalities: nonspecific T wave changes  Conduction Disutrbances:none  Narrative Interpretation: Sinus rhythm at 72 bpm with  nonspecific T wave inversion and lead V1, V2.  EKG is very comparable to the previous ones.  No acute findings.  Old EKG Reviewed: unchanged  I have personally reviewed the EKG tracing and agree with the computerized printout as noted.   Assessment and Plan :   PDMP not reviewed this encounter.  1. Upper abdominal pain   2. Constipation, unspecified constipation type   3. Abdominal pain, epigastric   4. Atypical chest pain     I have low suspicion for cardiopulmonary source of her symptoms.  Suspect that she has GERD, also differential includes PUD, constipation, malignancy.  Recommended follow-up with gastroenterology for further evaluation including procedural work-up such as endoscopy or colonoscopy.  In the meantime, start Prilosec. Counseled patient on potential for adverse effects with medications prescribed/recommended today, ER and return-to-clinic precautions discussed, patient verbalized understanding.    Wallis Bamberg, [place] 07/16/20 6073

## 2020-07-16 NOTE — ED Triage Notes (Signed)
Pt c/o chest and abdominal pain that has been going on for 1 month.  She states she has not been able to eat or sleep. Pt states she has been having the feeling to vomit. Pt states the middle of her chest hurts.  She denies arm pain and states she feels dizzy when sitting down and standing up.

## 2020-07-17 ENCOUNTER — Encounter: Payer: Self-pay | Admitting: Physician Assistant

## 2020-07-19 ENCOUNTER — Other Ambulatory Visit: Payer: Self-pay

## 2020-07-19 ENCOUNTER — Emergency Department (HOSPITAL_COMMUNITY): Payer: Medicare Other

## 2020-07-19 ENCOUNTER — Inpatient Hospital Stay (HOSPITAL_COMMUNITY)
Admission: EM | Admit: 2020-07-19 | Discharge: 2020-07-25 | DRG: 418 | Disposition: A | Discharge status: Home / Self Care | Payer: Medicare Other | Attending: Family Medicine | Admitting: Family Medicine

## 2020-07-19 DIAGNOSIS — E7849 Other hyperlipidemia: Secondary | ICD-10-CM | POA: Diagnosis present

## 2020-07-19 DIAGNOSIS — K8050 Calculus of bile duct without cholangitis or cholecystitis without obstruction: Secondary | ICD-10-CM | POA: Diagnosis not present

## 2020-07-19 DIAGNOSIS — D631 Anemia in chronic kidney disease: Secondary | ICD-10-CM | POA: Diagnosis not present

## 2020-07-19 DIAGNOSIS — D6959 Other secondary thrombocytopenia: Secondary | ICD-10-CM | POA: Diagnosis not present

## 2020-07-19 DIAGNOSIS — N1832 Chronic kidney disease, stage 3b: Secondary | ICD-10-CM | POA: Diagnosis not present

## 2020-07-19 DIAGNOSIS — A048 Other specified bacterial intestinal infections: Secondary | ICD-10-CM | POA: Diagnosis not present

## 2020-07-19 DIAGNOSIS — E876 Hypokalemia: Secondary | ICD-10-CM | POA: Diagnosis present

## 2020-07-19 DIAGNOSIS — K219 Gastro-esophageal reflux disease without esophagitis: Secondary | ICD-10-CM | POA: Diagnosis not present

## 2020-07-19 DIAGNOSIS — R1012 Left upper quadrant pain: Secondary | ICD-10-CM | POA: Diagnosis present

## 2020-07-19 DIAGNOSIS — Z20822 Contact with and (suspected) exposure to covid-19: Secondary | ICD-10-CM | POA: Diagnosis not present

## 2020-07-19 DIAGNOSIS — H0260 Xanthelasma of unspecified eye, unspecified eyelid: Secondary | ICD-10-CM | POA: Diagnosis not present

## 2020-07-19 DIAGNOSIS — N179 Acute kidney failure, unspecified: Secondary | ICD-10-CM | POA: Diagnosis present

## 2020-07-19 DIAGNOSIS — D62 Acute posthemorrhagic anemia: Secondary | ICD-10-CM | POA: Diagnosis not present

## 2020-07-19 DIAGNOSIS — K8020 Calculus of gallbladder without cholecystitis without obstruction: Secondary | ICD-10-CM

## 2020-07-19 DIAGNOSIS — K828 Other specified diseases of gallbladder: Secondary | ICD-10-CM | POA: Diagnosis present

## 2020-07-19 DIAGNOSIS — N184 Chronic kidney disease, stage 4 (severe): Secondary | ICD-10-CM

## 2020-07-19 DIAGNOSIS — Y848 Other medical procedures as the cause of abnormal reaction of the patient, or of later complication, without mention of misadventure at the time of the procedure: Secondary | ICD-10-CM | POA: Diagnosis not present

## 2020-07-19 DIAGNOSIS — K5900 Constipation, unspecified: Secondary | ICD-10-CM | POA: Diagnosis present

## 2020-07-19 DIAGNOSIS — K811 Chronic cholecystitis: Secondary | ICD-10-CM | POA: Diagnosis not present

## 2020-07-19 DIAGNOSIS — R072 Precordial pain: Secondary | ICD-10-CM | POA: Diagnosis not present

## 2020-07-19 DIAGNOSIS — R945 Abnormal results of liver function studies: Secondary | ICD-10-CM | POA: Diagnosis not present

## 2020-07-19 DIAGNOSIS — I129 Hypertensive chronic kidney disease with stage 1 through stage 4 chronic kidney disease, or unspecified chronic kidney disease: Secondary | ICD-10-CM | POA: Diagnosis present

## 2020-07-19 DIAGNOSIS — K319 Disease of stomach and duodenum, unspecified: Secondary | ICD-10-CM | POA: Diagnosis not present

## 2020-07-19 DIAGNOSIS — R7989 Other specified abnormal findings of blood chemistry: Secondary | ICD-10-CM | POA: Diagnosis not present

## 2020-07-19 DIAGNOSIS — K838 Other specified diseases of biliary tract: Secondary | ICD-10-CM | POA: Diagnosis not present

## 2020-07-19 DIAGNOSIS — K8051 Calculus of bile duct without cholangitis or cholecystitis with obstruction: Secondary | ICD-10-CM | POA: Diagnosis not present

## 2020-07-19 DIAGNOSIS — E785 Hyperlipidemia, unspecified: Secondary | ICD-10-CM | POA: Diagnosis present

## 2020-07-19 DIAGNOSIS — K91840 Postprocedural hemorrhage and hematoma of a digestive system organ or structure following a digestive system procedure: Secondary | ICD-10-CM | POA: Diagnosis not present

## 2020-07-19 DIAGNOSIS — B9681 Helicobacter pylori [H. pylori] as the cause of diseases classified elsewhere: Secondary | ICD-10-CM | POA: Diagnosis present

## 2020-07-19 DIAGNOSIS — R6883 Chills (without fever): Secondary | ICD-10-CM | POA: Diagnosis not present

## 2020-07-19 DIAGNOSIS — I9581 Postprocedural hypotension: Secondary | ICD-10-CM | POA: Diagnosis not present

## 2020-07-19 DIAGNOSIS — K8045 Calculus of bile duct with chronic cholecystitis with obstruction: Secondary | ICD-10-CM | POA: Diagnosis not present

## 2020-07-19 DIAGNOSIS — R1084 Generalized abdominal pain: Secondary | ICD-10-CM | POA: Diagnosis not present

## 2020-07-19 DIAGNOSIS — N189 Chronic kidney disease, unspecified: Secondary | ICD-10-CM | POA: Diagnosis not present

## 2020-07-19 DIAGNOSIS — R1010 Upper abdominal pain, unspecified: Secondary | ICD-10-CM

## 2020-07-19 DIAGNOSIS — N1831 Chronic kidney disease, stage 3a: Secondary | ICD-10-CM

## 2020-07-19 DIAGNOSIS — Z4659 Encounter for fitting and adjustment of other gastrointestinal appliance and device: Secondary | ICD-10-CM | POA: Diagnosis not present

## 2020-07-19 DIAGNOSIS — K2950 Unspecified chronic gastritis without bleeding: Secondary | ICD-10-CM | POA: Diagnosis not present

## 2020-07-19 DIAGNOSIS — Z9889 Other specified postprocedural states: Secondary | ICD-10-CM | POA: Diagnosis not present

## 2020-07-19 DIAGNOSIS — K8309 Other cholangitis: Secondary | ICD-10-CM | POA: Diagnosis not present

## 2020-07-19 DIAGNOSIS — K91841 Postprocedural hemorrhage and hematoma of a digestive system organ or structure following other procedure: Secondary | ICD-10-CM | POA: Diagnosis not present

## 2020-07-19 DIAGNOSIS — Z466 Encounter for fitting and adjustment of urinary device: Secondary | ICD-10-CM | POA: Diagnosis not present

## 2020-07-19 DIAGNOSIS — K8071 Calculus of gallbladder and bile duct without cholecystitis with obstruction: Secondary | ICD-10-CM | POA: Diagnosis not present

## 2020-07-19 DIAGNOSIS — I959 Hypotension, unspecified: Secondary | ICD-10-CM | POA: Diagnosis not present

## 2020-07-19 DIAGNOSIS — I10 Essential (primary) hypertension: Secondary | ICD-10-CM | POA: Diagnosis not present

## 2020-07-19 DIAGNOSIS — Z419 Encounter for procedure for purposes other than remedying health state, unspecified: Secondary | ICD-10-CM

## 2020-07-19 DIAGNOSIS — K3189 Other diseases of stomach and duodenum: Secondary | ICD-10-CM | POA: Diagnosis not present

## 2020-07-19 DIAGNOSIS — K8043 Calculus of bile duct with acute cholecystitis with obstruction: Secondary | ICD-10-CM | POA: Diagnosis not present

## 2020-07-19 DIAGNOSIS — R1013 Epigastric pain: Secondary | ICD-10-CM | POA: Diagnosis not present

## 2020-07-19 DIAGNOSIS — R55 Syncope and collapse: Secondary | ICD-10-CM | POA: Diagnosis not present

## 2020-07-19 DIAGNOSIS — Z48815 Encounter for surgical aftercare following surgery on the digestive system: Secondary | ICD-10-CM | POA: Diagnosis not present

## 2020-07-19 DIAGNOSIS — R109 Unspecified abdominal pain: Secondary | ICD-10-CM | POA: Diagnosis not present

## 2020-07-19 LAB — LIPASE, BLOOD: Lipase: 63 U/L — ABNORMAL HIGH (ref 11–51)

## 2020-07-19 LAB — CBC WITH DIFFERENTIAL/PLATELET
Abs Immature Granulocytes: 0.02 10*3/uL (ref 0.00–0.07)
Basophils Absolute: 0 10*3/uL (ref 0.0–0.1)
Basophils Relative: 0 %
Eosinophils Absolute: 0 10*3/uL (ref 0.0–0.5)
Eosinophils Relative: 0 %
HCT: 41.2 % (ref 36.0–46.0)
Hemoglobin: 13.3 g/dL (ref 12.0–15.0)
Immature Granulocytes: 0 %
Lymphocytes Relative: 9 %
Lymphs Abs: 0.7 10*3/uL (ref 0.7–4.0)
MCH: 29 pg (ref 26.0–34.0)
MCHC: 32.3 g/dL (ref 30.0–36.0)
MCV: 89.8 fL (ref 80.0–100.0)
Monocytes Absolute: 0.2 10*3/uL (ref 0.1–1.0)
Monocytes Relative: 3 %
Neutro Abs: 6.9 10*3/uL (ref 1.7–7.7)
Neutrophils Relative %: 88 %
Platelets: 231 10*3/uL (ref 150–400)
RBC: 4.59 MIL/uL (ref 3.87–5.11)
RDW: 12.2 % (ref 11.5–15.5)
WBC: 7.8 10*3/uL (ref 4.0–10.5)
nRBC: 0 % (ref 0.0–0.2)

## 2020-07-19 LAB — URINALYSIS, ROUTINE W REFLEX MICROSCOPIC
Bilirubin Urine: NEGATIVE
Glucose, UA: NEGATIVE mg/dL
Ketones, ur: 20 mg/dL — AB
Leukocytes,Ua: NEGATIVE
Nitrite: NEGATIVE
Protein, ur: NEGATIVE mg/dL
Specific Gravity, Urine: 1.008 (ref 1.005–1.030)
pH: 5 (ref 5.0–8.0)

## 2020-07-19 LAB — TROPONIN I (HIGH SENSITIVITY)
Troponin I (High Sensitivity): 13 ng/L (ref <18)
Troponin I (High Sensitivity): 19 ng/L — ABNORMAL HIGH (ref <18)

## 2020-07-19 LAB — RESP PANEL BY RT-PCR (FLU A&B, COVID) ARPGX2
Influenza A by PCR: NEGATIVE
Influenza B by PCR: NEGATIVE
SARS Coronavirus 2 by RT PCR: NEGATIVE

## 2020-07-19 LAB — COMPREHENSIVE METABOLIC PANEL
ALT: 122 U/L — ABNORMAL HIGH (ref 0–44)
AST: 189 U/L — ABNORMAL HIGH (ref 15–41)
Albumin: 3.9 g/dL (ref 3.5–5.0)
Alkaline Phosphatase: 322 U/L — ABNORMAL HIGH (ref 38–126)
Anion gap: 19 — ABNORMAL HIGH (ref 5–15)
BUN: 23 mg/dL (ref 8–23)
CO2: 20 mmol/L — ABNORMAL LOW (ref 22–32)
Calcium: 9.4 mg/dL (ref 8.9–10.3)
Chloride: 94 mmol/L — ABNORMAL LOW (ref 98–111)
Creatinine, Ser: 1.81 mg/dL — ABNORMAL HIGH (ref 0.44–1.00)
GFR, Estimated: 29 mL/min — ABNORMAL LOW (ref >60)
Glucose, Bld: 152 mg/dL — ABNORMAL HIGH (ref 70–99)
Potassium: 4 mmol/L (ref 3.5–5.1)
Sodium: 133 mmol/L — ABNORMAL LOW (ref 135–145)
Total Bilirubin: 2.5 mg/dL — ABNORMAL HIGH (ref 0.3–1.2)
Total Protein: 7.8 g/dL (ref 6.5–8.1)

## 2020-07-19 LAB — PROTIME-INR
INR: 1.1 (ref 0.8–1.2)
Prothrombin Time: 13.9 seconds (ref 11.4–15.2)

## 2020-07-19 MED ORDER — ONDANSETRON HCL 4 MG/2ML IJ SOLN
4.0000 mg | Freq: Once | INTRAMUSCULAR | Status: AC
  Administered 2020-07-19: 4 mg via INTRAVENOUS
  Filled 2020-07-19: qty 2

## 2020-07-19 MED ORDER — ONDANSETRON HCL 4 MG/2ML IJ SOLN
4.0000 mg | Freq: Four times a day (QID) | INTRAMUSCULAR | Status: DC | PRN
  Administered 2020-07-20: 4 mg via INTRAVENOUS
  Filled 2020-07-19: qty 2

## 2020-07-19 MED ORDER — SODIUM CHLORIDE 0.9 % IV SOLN: INTRAVENOUS | Status: DC

## 2020-07-19 MED ORDER — FENTANYL CITRATE (PF) 100 MCG/2ML IJ SOLN
12.5000 ug | Freq: Once | INTRAMUSCULAR | Status: AC
  Administered 2020-07-19: 12.5 ug via INTRAVENOUS
  Filled 2020-07-19: qty 2

## 2020-07-19 MED ORDER — SODIUM CHLORIDE 0.9 % IV SOLN
INTRAVENOUS | Status: DC
  Administered 2020-07-19: 75 mL/h via INTRAVENOUS

## 2020-07-19 MED ORDER — MORPHINE SULFATE (PF) 2 MG/ML IV SOLN
1.0000 mg | INTRAVENOUS | Status: DC | PRN
  Administered 2020-07-19: 1 mg via INTRAVENOUS
  Filled 2020-07-19: qty 1

## 2020-07-19 MED ORDER — FENTANYL CITRATE (PF) 100 MCG/2ML IJ SOLN
50.0000 ug | INTRAMUSCULAR | Status: DC | PRN
  Administered 2020-07-19: 50 ug via INTRAVENOUS
  Filled 2020-07-19: qty 2

## 2020-07-19 MED ORDER — SODIUM CHLORIDE 0.9 % IV BOLUS
500.0000 mL | Freq: Once | INTRAVENOUS | Status: AC
  Administered 2020-07-19: 500 mL via INTRAVENOUS

## 2020-07-19 NOTE — Progress Notes (Signed)
Patient admitted from ED to 6N04. None English speaking. Alert and oriented x3. Vital signs within normal limits. Denies c/o pain at this time. Oriented to room and remote.

## 2020-07-19 NOTE — Plan of Care (Signed)
  Problem: Clinical Measurements: Goal: Diagnostic test results will improve Outcome: Progressing   

## 2020-07-19 NOTE — H&P (Signed)
History and Physical    Becky Turner JJH:[id number] DOB: 07-06-1945 DOA: 07/19/2020  PCP: Shawnee Knapp, MD  Patient coming from: Home  I have personally briefly reviewed patient's old medical records in [place]  Chief Complaint: Abdominal pain  HPI: Becky Turner is a [facility] to be speaking 75 y.o. female with medical history significant for hypertension, hyperlipidemia, CKD stage IIIa who presented to the ED for evaluation of abdominal pain with a near syncopal episode earlier this morning.  Patient has been having epigastric abdominal pain for about 2 months now.  She has had some nausea without emesis or diarrhea.  She has been having right lower chest discomfort as well.  ED Course:  Initial vitals showed BP 100/52, pulse 59, RR 16, temp 97.7 F, SPO2 100% on room air.  Labs significant for AST 189, ALT 122, alk phos 322, total bilirubin 2.5, BUN 23, creatinine 1.81, GFR 29 (last creatinine 1.23 01/15/2020), sodium 133, potassium 4.0, bicarb 20, lipase 63, high-sensitivity troponin I 13 and 19, INR 1.1.  WBC 7.8, hemoglobin 13.3, platelets 231,000.  SARS-CoV-2 PCR is in process.  Portable chest x-ray negative for acute abnormality.  CT abdomen/pelvis without contrast showed marked biliary duct dilatation secondary to a distal common duct stone or stones up to 1.2 cm.  Patient was given 506 cc normal saline, IV Zofran.  EDP consulted on-call GI who recommended medical admission, n.p.o. at midnight, and plan for ERCP in the morning.  The hospitalist service was consulted to admit for further management.  Review of Systems: All systems reviewed and are negative except as documented in history of present illness above.   Past Medical History:  Diagnosis Date   Hyperlipidemia    Hyperlipidemia, familial, high LDL 01/30/2019   Hypertension     No past surgical history on file.  Social History:  reports that she has never smoked. She has never used smokeless tobacco. She reports  that she does not drink alcohol and does not use drugs.  Allergies  Allergen Reactions   Aspirin Nausea And Vomiting    No family history on file.   Prior to Admission medications   Medication Sig Start Date End Date Taking? Authorizing Provider  amLODipine (NORVASC) 5 MG tablet TAKE 1 TABLET(5 MG) BY MOUTH DAILY Patient taking differently: Take 5 mg by mouth daily. 04/01/20  Yes Adrian Prows, MD  lisinopril-hydrochlorothiazide (ZESTORETIC) 20-25 MG tablet TAKE 1 TABLET BY MOUTH DAILY Patient taking differently: Take 1 tablet by mouth daily. 04/01/20  Yes Adrian Prows, MD  metoprolol succinate (TOPROL-XL) 50 MG 24 hr tablet TAKE 1 TABLET BY MOUTH DAILY Patient taking differently: Take 50 mg by mouth daily. 07/01/20  Yes Adrian Prows, MD  omeprazole (PRILOSEC) 20 MG capsule Take 1 capsule (20 mg total) by mouth daily. 07/16/20  Yes Jaynee Eagles, PA-C  prednisoLONE acetate (PRED FORTE) 1 % ophthalmic suspension Place 1 drop into the right eye 4 (four) times daily.   Yes [provider]  rosuvastatin (CRESTOR) 10 MG tablet TAKE 1 TABLET BY MOUTH EVERY NIGHT Patient taking differently: Take 10 mg by mouth every evening. 07/01/20  Yes Adrian Prows, MD  PRALUENT 150 MG/ML SOAJ Inject 150 mLs as directed every 14 (fourteen) days. 06/24/20   Adrian Prows, MD    Physical Exam: Vitals:   07/19/20 1930 07/19/20 1935 07/19/20 1936 07/19/20 2040  BP: (!) 99/46   (!) 102/46  Pulse: 83 82  80  Resp: 20 (!) 21  17  Temp:  98 F (36.7 C) 99 F (37.2 C)  TempSrc:   Oral Oral  SpO2: 90% 93%  95%  Weight:      Height:       Constitutional: Resting in bed with head elevated, NAD, calm, appears tired and somewhat uncomfortable Eyes: PERRL, lids and conjunctivae normal ENMT: Mucous membranes are moist. Posterior pharynx clear of any exudate or lesions.Normal dentition.  Neck: normal, supple, no masses. Respiratory: clear to auscultation bilaterally, no wheezing, no crackles. Normal respiratory effort. No  accessory muscle use.  Cardiovascular: Regular rate and rhythm, no murmurs / rubs / gallops. No extremity edema. 2+ pedal pulses. Abdomen: Mild epigastric tenderness, no masses palpated. No hepatosplenomegaly. Bowel sounds positive.  Musculoskeletal: no clubbing / cyanosis. No joint deformity upper and lower extremities. Good ROM, no contractures. Normal muscle tone.  Skin: Slight jaundiced appearance, no rashes, lesions, ulcers. No induration Neurologic: CN 2-12 grossly intact. Sensation intact. Strength 5/5 in all 4.  Psychiatric: Awake and alert.  Normal mood.     Labs on Admission: I have personally reviewed following labs and imaging studies  CBC: Recent Labs  Lab 07/19/20 1527  WBC 7.8  NEUTROABS 6.9  HGB 13.3  HCT 41.2  MCV 89.8  PLT 597   Basic Metabolic Panel: Recent Labs  Lab 07/19/20 1527  NA 133*  K 4.0  CL 94*  CO2 20*  GLUCOSE 152*  BUN 23  CREATININE 1.81*  CALCIUM 9.4   GFR: Estimated Creatinine Clearance: 19.3 mL/min (A) (by C-G formula based on SCr of 1.81 mg/dL (H)). Liver Function Tests: Recent Labs  Lab 07/19/20 1527  AST 189*  ALT 122*  ALKPHOS 322*  BILITOT 2.5*  PROT 7.8  ALBUMIN 3.9   Recent Labs  Lab 07/19/20 1527  LIPASE 63*   No results for input(s): AMMONIA in the last 168 hours. Coagulation Profile: Recent Labs  Lab 07/19/20 1922  INR 1.1   Cardiac Enzymes: No results for input(s): CKTOTAL, CKMB, CKMBINDEX, TROPONINI in the last 168 hours. BNP (last 3 results) No results for input(s): PROBNP in the last 8760 hours. HbA1C: No results for input(s): HGBA1C in the last 72 hours. CBG: No results for input(s): GLUCAP in the last 168 hours. Lipid Profile: No results for input(s): CHOL, HDL, LDLCALC, TRIG, CHOLHDL, LDLDIRECT in the last 72 hours. Thyroid Function Tests: No results for input(s): TSH, T4TOTAL, FREET4, T3FREE, THYROIDAB in the last 72 hours. Anemia Panel: No results for input(s): VITAMINB12, FOLATE,  FERRITIN, TIBC, IRON, RETICCTPCT in the last 72 hours. Urine analysis:    Component Value Date/Time   COLORURINE YELLOW 07/19/2020 1527   APPEARANCEUR CLEAR 07/19/2020 1527   LABSPEC 1.008 07/19/2020 1527   PHURINE 5.0 07/19/2020 1527   GLUCOSEU NEGATIVE 07/19/2020 1527   HGBUR SMALL (A) 07/19/2020 1527   BILIRUBINUR NEGATIVE 07/19/2020 1527   BILIRUBINUR negative 09/14/2015 0856   KETONESUR 20 (A) 07/19/2020 1527   PROTEINUR NEGATIVE 07/19/2020 1527   UROBILINOGEN 0.2 09/14/2015 0856   NITRITE NEGATIVE 07/19/2020 1527   LEUKOCYTESUR NEGATIVE 07/19/2020 1527    Radiological Exams on Admission: CT Abdomen Pelvis Wo Contrast  Result Date: 07/19/2020 CLINICAL DATA:  Syncope.  Epigastric abdominal pain.  Constipation. EXAM: CT ABDOMEN AND PELVIS WITHOUT CONTRAST TECHNIQUE: Multidetector CT imaging of the abdomen and pelvis was performed following the standard protocol without IV contrast. COMPARISON:  Chest radiograph earlier today. 09/14/2015 abdominal plain film. FINDINGS: Lower chest: Motion degradation. Bibasilar atelectasis. Mild cardiomegaly. Hepatobiliary: No gross liver lesion on this noncontrast  CT. The gallbladder is distended, without calcified stone or specific evidence of acute cholecystitis. Marked biliary duct dilatation with the common duct measuring 2.4 cm on 31/6. Followed to the level of a distal common duct stone or stones including up to 1.2 cm on coronal image 30 and transverse image 35. Pancreas: Mild upper abdominal motion degradation. No pancreatic duct dilatation or acute inflammation. Spleen: Normal in size, without focal abnormality. Adrenals/Urinary Tract: Normal adrenal glands. Too small to characterize lesions in both kidneys. No renal calculi or hydronephrosis. No hydroureter or ureteric calculi. No bladder calculi. Stomach/Bowel: Normal stomach, without wall thickening. 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 Otherwise normal small bowel. Normal colon,  appendix, and terminal ileum. Vascular/Lymphatic: Aortic atherosclerosis. No abdominopelvic adenopathy. Reproductive: Normal uterus and adnexa. Other: No significant free fluid. Mild pelvic floor laxity. No free intraperitoneal air. Tiny fat containing periumbilical hernia. Musculoskeletal: No acute osseous abnormality. IMPRESSION: 1. Marked biliary duct dilatation secondary to a distal common duct stone or stones, on the order of 1.2 cm. 2. Gallbladder distension, without evidence of acute cholecystitis. 3. Mild motion degradation. 4.  Aortic Atherosclerosis (ICD10-I70.0). Electronically Signed   By: Abigail Miyamoto M.D.   On: 07/19/2020 18:07   DG Chest Port 1 View  Result Date: 07/19/2020 CLINICAL DATA:  Abdominal pain and chills, initial encounter EXAM: PORTABLE CHEST 1 VIEW COMPARISON:  11/16/2003 FINDINGS: Cardiac shadow is mildly prominent but stable. Aortic calcifications are again seen. Lungs are clear bilaterally. No acute bony abnormality is noted. Degenerative change of the thoracic spine is seen. IMPRESSION: No acute abnormality noted. Electronically Signed   By: Inez Catalina M.D.   On: 07/19/2020 16:27    EKG: Personally reviewed. Normal sinus rhythm, nonspecific T wave changes inferior and anterior leads.  Assessment/Plan Principal Problem:   Choledocholithiasis Active Problems:   Essential hypertension   Hyperlipidemia, familial, high LDL   Acute kidney injury superimposed on CKD ([place])   Becky Turner is a [facility] to be speaking 75 y.o. female with medical history significant for hypertension, hyperlipidemia, CKD stage IIIa who is admitted with elevated LFTs and choledocholithiasis.  Choledocholithiasis with elevated LFTs: Marked biliary duct irritation secondary to CBD stone(s) noted on CT imaging. -GI consulted, plan for ERCP in a.m. -Keep n.p.o. after midnight -Continue IV fluid hydration overnight with analgesics as needed  AKI on CKD stage IIIa: -Continue IV fluids as above -Hold  home lisinopril-HCTZ  Hypertension: Antihypertensives on hold with soft blood pressures.  Hyperlipidemia: Holding home statin with elevated LFTs.  DVT prophylaxis: SCDs Code Status: Full code Family Communication: None present on admission Disposition Plan: From home and likely discharge to home pending further GI evaluation Consults called: Gastroenterology Level of care: Med-Surg Admission status:  Status is: Inpatient  Remains inpatient appropriate because:Ongoing diagnostic testing needed not appropriate for outpatient work up and Inpatient level of care appropriate due to severity of illness  Dispo: The patient is from: Home              Anticipated d/c is to: Home              Patient currently is not medically stable to d/c.   Difficult to place patient No   Zada Finders MD [facility]  If 7PM-7AM, please contact night-coverage [email or web address]  07/19/2020, 11:13 PM

## 2020-07-19 NOTE — ED Notes (Signed)
Report attempted 

## 2020-07-19 NOTE — ED Provider Notes (Signed)
Emergency Medicine Provider Triage Evaluation Note  Sianni Cloninger , a 75 y.o. female  was evaluated in triage.  Pt complains of luq abd pain for the last few days. She has also had nv, chest pain. Had a syncopal episode today.  Review of Systems  Positive: Abd pain, nv, chest pain Negative: fever  Physical Exam  There were no vitals taken for this visit. Gen:   Awake, ill appearing Resp:  Normal effort  MSK:   Moves extremities without difficulty  Other:  Epigastric and ruq ttp  Medical Decision Making  Medically screening exam initiated at 2:23 PM.  Appropriate orders placed.  Pricella Minassian was informed that the remainder of the evaluation will be completed by another provider, this initial triage assessment does not replace that evaluation, and the importance of remaining in the ED until their evaluation is complete.     Rayne Du 07/19/20 1434    Vanetta Mulders, MD 07/19/20 [phone]

## 2020-07-19 NOTE — ED Provider Notes (Signed)
[facility] EMERGENCY DEPARTMENT Provider Note   CSN: [id number] Arrival date & time: 07/19/20  1422     History Chief Complaint  Patient presents with   Abdominal Pain   Loss of Consciousness    Becky Turner is a 75 y.o. female.  Patient here with family friend.  Patient lives by herself.  Patient had a near syncopal episode this morning they think secondary to the epigastric abdominal pain that she has been having for a couple months.  Patient was also seen at urgent care on June 7 for same.  Patient is having some nausea and vomiting no diarrhea.  He mentions more concern for constipation.  Somewhat of a language barrier but the family friend speaks English very well and does a good job with interpretation.  Past medical history significant for hyperlipidemia and hypertension.  Has had a history of dizziness in the past.  Review of urgent care note from when patient was seen.  They stated that patient had substernal chest pain that radiated into the abdomen.  They felt that it was atypical type chest pain.  And they were not concerned about acute cardiac event.  They stated that the symptoms been ongoing for 1 month.  Family states that is been on for about 2 months.      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   OB History   No obstetric history on file.     No family history on file.  Social History   Tobacco Use   Smoking status: Never   Smokeless tobacco: Never  Substance Use Topics   Alcohol use: No   Drug use: No    Home Medications Prior to Admission  medications   Medication Sig Start Date End Date Taking? Authorizing Provider  amLODipine (NORVASC) 5 MG tablet TAKE 1 TABLET(5 MG) BY MOUTH DAILY 04/01/20   Yates Decamp, MD  lisinopril-hydrochlorothiazide (ZESTORETIC) 20-25 MG tablet TAKE 1 TABLET BY MOUTH DAILY 04/01/20   Yates Decamp, MD  metoprolol succinate (TOPROL-XL) 50 MG 24 hr tablet TAKE 1 TABLET BY MOUTH DAILY 07/01/20   Yates Decamp, MD  omeprazole (PRILOSEC) 20 MG capsule Take 1 capsule (20 mg total) by mouth daily. 07/16/20   Wallis Bamberg, PA-C  PRALUENT 150 MG/ML SOAJ Inject 150 mLs as directed every 14 (fourteen) days. 06/24/20   Yates Decamp, MD  rosuvastatin (CRESTOR) 10 MG tablet TAKE 1 TABLET BY MOUTH EVERY NIGHT 07/01/20   Yates Decamp, MD    Allergies    Aspirin  Review of Systems   Review of Systems  Constitutional:  Negative for chills and fever.  HENT:  Negative for ear pain and sore throat.   Eyes:  Negative for pain and visual disturbance.  Respiratory:  Negative for cough and shortness of breath.   Cardiovascular:  Negative for chest pain and palpitations.  Gastrointestinal:  Positive for abdominal pain, constipation, nausea and vomiting.  Genitourinary:  Negative for dysuria and hematuria.  Musculoskeletal:  Negative for arthralgias and back pain.  Skin:  Negative for color change and rash.  Neurological:  Negative  for seizures and syncope.  All other systems reviewed and are negative.  Physical Exam Updated Vital Signs BP (!) 114/35   Pulse (!) 59   Temp 97.7 F (36.5 C) (Oral)   Resp 18   Ht 1.524 m (5')   Wt 52.6 kg   SpO2 99%   BMI 22.65 kg/m   Physical Exam Vitals and nursing note reviewed.  Constitutional:      General: She is not in acute distress.    Appearance: Normal appearance. She is well-developed. She is not toxic-appearing.  HENT:     Head: Normocephalic and atraumatic.  Eyes:     Extraocular Movements: Extraocular movements intact.     Conjunctiva/sclera: Conjunctivae normal.     Pupils:  Pupils are equal, round, and reactive to light.  Cardiovascular:     Rate and Rhythm: Normal rate and regular rhythm.     Heart sounds: No murmur heard. Pulmonary:     Effort: Pulmonary effort is normal. No respiratory distress.     Breath sounds: Normal breath sounds. No wheezing or rales.  Abdominal:     General: There is no distension.     Palpations: Abdomen is soft. There is no mass.     Tenderness: There is no abdominal tenderness. There is no guarding.  Musculoskeletal:        General: No swelling. Normal range of motion.     Cervical back: Normal range of motion and neck supple.  Skin:    General: Skin is warm and dry.     Capillary Refill: Capillary refill takes 2 to 3 seconds.  Neurological:     General: No focal deficit present.     Mental Status: She is alert and oriented to person, place, and time.     Cranial Nerves: No cranial nerve deficit.     Sensory: No sensory deficit.     Motor: No weakness.    ED Results / Procedures / Treatments   Labs (all labs ordered are listed, but only abnormal results are displayed) Labs Reviewed  CBC WITH DIFFERENTIAL/PLATELET  COMPREHENSIVE METABOLIC PANEL  LIPASE, BLOOD  URINALYSIS, ROUTINE W REFLEX MICROSCOPIC  TROPONIN I (HIGH SENSITIVITY)    EKG EKG Interpretation  Date/Time:  Friday July 19 2020 14:30:20 EDT Ventricular Rate:  66 PR Interval:  152 QRS Duration: 90 QT Interval:  400 QTC Calculation: 419 R Axis:   66 Text Interpretation: Normal sinus rhythm T wave abnormality, consider inferior ischemia T wave abnormality, consider anterior ischemia Abnormal ECG Confirmed by Vanetta Mulders [phone]) on 07/19/2020 4:23:32 PM  Radiology DG Chest Port 1 View  Result Date: 07/19/2020 CLINICAL DATA:  Abdominal pain and chills, initial encounter EXAM: PORTABLE CHEST 1 VIEW COMPARISON:  11/16/2003 FINDINGS: Cardiac shadow is mildly prominent but stable. Aortic calcifications are again seen. Lungs are clear bilaterally. No  acute bony abnormality is noted. Degenerative change of the thoracic spine is seen. IMPRESSION: No acute abnormality noted. Electronically Signed   By: Alcide Clever M.D.   On: 07/19/2020 16:27    Procedures Procedures   Medications Ordered in ED Medications  0.9 %  sodium chloride infusion (75 mL/hr Intravenous New Bag/Given 07/19/20 1616)  sodium chloride 0.9 % bolus 500 mL (500 mLs Intravenous New Bag/Given 07/19/20 1615)  ondansetron (ZOFRAN) injection 4 mg (4 mg Intravenous Given 07/19/20 1616)  fentaNYL (SUBLIMAZE) injection 12.5 mcg (12.5 mcg Intravenous Given 07/19/20 1616)    ED Course  I have reviewed the triage vital signs and the nursing  notes.  Pertinent labs & imaging results that were available during my care of the patient were reviewed by me and considered in my medical decision making (see chart for details).    MDM Rules/Calculators/A&P                          Patient's abdomen soft nontender.  Complaint patient, points to epigastric area.  So patient will receive work-up for possible chest etiology as well as abdominal etiology.  Patient's chest x-ray to our eyes looks very clear.  Patient's labs are completely pending.  Patient has CT scan of abdomen and pelvis ordered as well.  Patient has troponins pending.  Patient's EKG had some ST changes inferiorly.  Suggestive of possible ischemia  Patient's vital signs without any initial concerns temp was 97.7.  Heart rate 88 blood pressure 100/52 respirations 16 room air sats 100%.  Patient of returned over to Dr. Eber Hong who will follow-up on labs and CT results.  X-ray officially read as normal by radiology. Final Clinical Impression(s) / ED Diagnoses Final diagnoses:  Pain of upper abdomen    Rx / DC Orders ED Discharge Orders     None        Vanetta Mulders, MD 07/19/20 [phone]

## 2020-07-19 NOTE — ED Triage Notes (Signed)
Pt's daughter reports syncopal episode this morning. Has been having LUQ pain, constipation, dizziness x a few days and seen at [facility] for same on 6/7.

## 2020-07-19 NOTE — ED Provider Notes (Signed)
  Physical Exam  BP (!) 102/42   Pulse 78   Temp 97.7 F (36.5 C) (Oral)   Resp (!) 24   Ht 1.524 m (5')   Wt 52.6 kg   SpO2 96%   BMI 22.65 kg/m   Physical Exam Well-appearing, no distress ED Course/Procedures     Procedures  MDM  I discussed the care with the gastroenterologist who recommends that the patient be n.p.o. after midnight and they will perform ERCP in the morning  Paged hospitalist for admission       Eber Hong, MD 07/19/20 1842

## 2020-07-19 NOTE — ED Notes (Signed)
Pt transferred to CT.

## 2020-07-20 ENCOUNTER — Inpatient Hospital Stay (HOSPITAL_COMMUNITY): Payer: Medicare Other

## 2020-07-20 ENCOUNTER — Inpatient Hospital Stay (HOSPITAL_COMMUNITY): Payer: Medicare Other | Admitting: Anesthesiology

## 2020-07-20 ENCOUNTER — Encounter (HOSPITAL_COMMUNITY)
Admission: EM | Disposition: A | Discharge status: Home / Self Care | Payer: Self-pay | Source: Home / Self Care | Attending: Family Medicine

## 2020-07-20 ENCOUNTER — Encounter (HOSPITAL_COMMUNITY): Payer: Self-pay | Admitting: Internal Medicine

## 2020-07-20 DIAGNOSIS — K8309 Other cholangitis: Secondary | ICD-10-CM

## 2020-07-20 DIAGNOSIS — K91841 Postprocedural hemorrhage and hematoma of a digestive system organ or structure following other procedure: Secondary | ICD-10-CM

## 2020-07-20 DIAGNOSIS — K8051 Calculus of bile duct without cholangitis or cholecystitis with obstruction: Secondary | ICD-10-CM

## 2020-07-20 DIAGNOSIS — I9581 Postprocedural hypotension: Secondary | ICD-10-CM

## 2020-07-20 DIAGNOSIS — R7989 Other specified abnormal findings of blood chemistry: Secondary | ICD-10-CM

## 2020-07-20 DIAGNOSIS — N179 Acute kidney failure, unspecified: Secondary | ICD-10-CM

## 2020-07-20 DIAGNOSIS — E7849 Other hyperlipidemia: Secondary | ICD-10-CM

## 2020-07-20 DIAGNOSIS — N189 Chronic kidney disease, unspecified: Secondary | ICD-10-CM

## 2020-07-20 DIAGNOSIS — R1010 Upper abdominal pain, unspecified: Secondary | ICD-10-CM

## 2020-07-20 DIAGNOSIS — I10 Essential (primary) hypertension: Secondary | ICD-10-CM

## 2020-07-20 HISTORY — PX: BILIARY STENT PLACEMENT: SHX5538

## 2020-07-20 HISTORY — PX: ERCP: SHX5425

## 2020-07-20 HISTORY — PX: SPHINCTEROTOMY: SHX5279

## 2020-07-20 HISTORY — PX: REMOVAL OF STONES: SHX5545

## 2020-07-20 HISTORY — PX: HEMOSTASIS CONTROL: SHX6838

## 2020-07-20 LAB — CBC
HCT: 30.8 % — ABNORMAL LOW (ref 36.0–46.0)
HCT: 35.3 % — ABNORMAL LOW (ref 36.0–46.0)
Hemoglobin: 10.1 g/dL — ABNORMAL LOW (ref 12.0–15.0)
Hemoglobin: 11.7 g/dL — ABNORMAL LOW (ref 12.0–15.0)
MCH: 29 pg (ref 26.0–34.0)
MCH: 29 pg (ref 26.0–34.0)
MCHC: 32.8 g/dL (ref 30.0–36.0)
MCHC: 33.1 g/dL (ref 30.0–36.0)
MCV: 88.5 fL (ref 80.0–100.0)
MCV: 87.4 fL (ref 80.0–100.0)
Platelets: 168 10*3/uL (ref 150–400)
Platelets: 209 10*3/uL (ref 150–400)
RBC: 3.48 MIL/uL — ABNORMAL LOW (ref 3.87–5.11)
RBC: 4.04 MIL/uL (ref 3.87–5.11)
RDW: 12.2 % (ref 11.5–15.5)
RDW: 12.1 % (ref 11.5–15.5)
WBC: 13 10*3/uL — ABNORMAL HIGH (ref 4.0–10.5)
WBC: 14.5 10*3/uL — ABNORMAL HIGH (ref 4.0–10.5)
nRBC: 0 % (ref 0.0–0.2)
nRBC: 0 % (ref 0.0–0.2)

## 2020-07-20 LAB — LACTIC ACID, PLASMA
Lactic Acid, Venous: 0.9 mmol/L (ref 0.5–1.9)
Lactic Acid, Venous: 0.9 mmol/L (ref 0.5–1.9)

## 2020-07-20 LAB — COMPREHENSIVE METABOLIC PANEL
ALT: 183 U/L — ABNORMAL HIGH (ref 0–44)
AST: 276 U/L — ABNORMAL HIGH (ref 15–41)
Albumin: 2.8 g/dL — ABNORMAL LOW (ref 3.5–5.0)
Alkaline Phosphatase: 269 U/L — ABNORMAL HIGH (ref 38–126)
Anion gap: 12 (ref 5–15)
BUN: 14 mg/dL (ref 8–23)
CO2: 23 mmol/L (ref 22–32)
Calcium: 8.2 mg/dL — ABNORMAL LOW (ref 8.9–10.3)
Chloride: 101 mmol/L (ref 98–111)
Creatinine, Ser: 1.61 mg/dL — ABNORMAL HIGH (ref 0.44–1.00)
GFR, Estimated: 33 mL/min — ABNORMAL LOW (ref >60)
Glucose, Bld: 116 mg/dL — ABNORMAL HIGH (ref 70–99)
Potassium: 3.8 mmol/L (ref 3.5–5.1)
Sodium: 136 mmol/L (ref 135–145)
Total Bilirubin: 2.2 mg/dL — ABNORMAL HIGH (ref 0.3–1.2)
Total Protein: 5.7 g/dL — ABNORMAL LOW (ref 6.5–8.1)

## 2020-07-20 LAB — PROCALCITONIN: Procalcitonin: 15.25 ng/mL

## 2020-07-20 LAB — PROTIME-INR
INR: 1.2 (ref 0.8–1.2)
Prothrombin Time: 15.1 seconds (ref 11.4–15.2)

## 2020-07-20 SURGERY — ERCP, WITH INTERVENTION IF INDICATED
Anesthesia: General

## 2020-07-20 MED ORDER — ONDANSETRON HCL 4 MG/2ML IJ SOLN
INTRAMUSCULAR | Status: DC | PRN
  Administered 2020-07-20: 4 mg via INTRAVENOUS

## 2020-07-20 MED ORDER — SODIUM CHLORIDE 0.9 % IV SOLN
INTRAVENOUS | Status: DC | PRN
  Administered 2020-07-20: 25 mL

## 2020-07-20 MED ORDER — LACTATED RINGERS IV BOLUS
500.0000 mL | Freq: Once | INTRAVENOUS | Status: AC
  Administered 2020-07-20: 500 mL via INTRAVENOUS

## 2020-07-20 MED ORDER — LIDOCAINE 2% (20 MG/ML) 5 ML SYRINGE: INTRAMUSCULAR | Status: DC | PRN

## 2020-07-20 MED ORDER — GLUCAGON HCL RDNA (DIAGNOSTIC) 1 MG IJ SOLR
INTRAMUSCULAR | Status: AC
  Filled 2020-07-20: qty 1

## 2020-07-20 MED ORDER — LIDOCAINE HCL (CARDIAC) PF 100 MG/5ML IV SOSY
PREFILLED_SYRINGE | INTRAVENOUS | Status: DC | PRN
  Administered 2020-07-20: 30 mg via INTRAVENOUS

## 2020-07-20 MED ORDER — SUCCINYLCHOLINE CHLORIDE 20 MG/ML IJ SOLN
INTRAMUSCULAR | Status: DC | PRN
  Administered 2020-07-20: 100 mg via INTRAVENOUS

## 2020-07-20 MED ORDER — EPINEPHRINE 1 MG/10ML IJ SOSY
PREFILLED_SYRINGE | INTRAMUSCULAR | Status: AC
  Filled 2020-07-20: qty 10

## 2020-07-20 MED ORDER — PREDNISOLONE ACETATE 1 % OP SUSP
1.0000 [drp] | Freq: Four times a day (QID) | OPHTHALMIC | Status: DC
  Administered 2020-07-20 – 2020-07-25 (×18): 1 [drp] via OPHTHALMIC
  Filled 2020-07-20: qty 5

## 2020-07-20 MED ORDER — INDOMETHACIN 50 MG RE SUPP
RECTAL | Status: AC
  Filled 2020-07-20: qty 2

## 2020-07-20 MED ORDER — CIPROFLOXACIN IN D5W 400 MG/200ML IV SOLN
INTRAVENOUS | Status: DC | PRN
  Administered 2020-07-20: 400 mg via INTRAVENOUS

## 2020-07-20 MED ORDER — SUGAMMADEX SODIUM 200 MG/2ML IV SOLN
INTRAVENOUS | Status: DC | PRN
  Administered 2020-07-20: 200 mg via INTRAVENOUS

## 2020-07-20 MED ORDER — INDOMETHACIN 50 MG RE SUPP
RECTAL | Status: DC | PRN
  Administered 2020-07-20: 100 mg via RECTAL

## 2020-07-20 MED ORDER — PIPERACILLIN-TAZOBACTAM 3.375 G IVPB
3.3750 g | Freq: Three times a day (TID) | INTRAVENOUS | Status: DC
  Administered 2020-07-20 – 2020-07-22 (×5): 3.375 g via INTRAVENOUS
  Filled 2020-07-20 (×3): qty 50

## 2020-07-20 MED ORDER — LACTATED RINGERS IV SOLN: INTRAVENOUS | Status: DC | PRN

## 2020-07-20 MED ORDER — LACTATED RINGERS IV SOLN: INTRAVENOUS | Status: DC

## 2020-07-20 MED ORDER — LACTATED RINGERS IV SOLN
INTRAVENOUS | Status: AC | PRN
  Administered 2020-07-20: 10 mL/h via INTRAVENOUS

## 2020-07-20 MED ORDER — PHENYLEPHRINE HCL (PRESSORS) 10 MG/ML IV SOLN
INTRAVENOUS | Status: DC | PRN
  Administered 2020-07-20: 40 ug via INTRAVENOUS
  Administered 2020-07-20: 100 ug via INTRAVENOUS
  Administered 2020-07-20: 80 ug via INTRAVENOUS
  Administered 2020-07-20: 100 ug via INTRAVENOUS

## 2020-07-20 MED ORDER — LACTATED RINGERS IV BOLUS
1000.0000 mL | Freq: Once | INTRAVENOUS | Status: AC
  Administered 2020-07-20: 1000 mL via INTRAVENOUS

## 2020-07-20 MED ORDER — ROCURONIUM BROMIDE 10 MG/ML (PF) SYRINGE
PREFILLED_SYRINGE | INTRAVENOUS | Status: DC | PRN
  Administered 2020-07-20: 40 mg via INTRAVENOUS

## 2020-07-20 MED ORDER — FENTANYL CITRATE (PF) 100 MCG/2ML IJ SOLN
INTRAMUSCULAR | Status: DC | PRN
  Administered 2020-07-20: 50 ug via INTRAVENOUS

## 2020-07-20 MED ORDER — SODIUM CHLORIDE (PF) 0.9 % IJ SOLN
PREFILLED_SYRINGE | INTRAMUSCULAR | Status: DC | PRN
  Administered 2020-07-20: 5 mL

## 2020-07-20 MED ORDER — GLUCAGON HCL RDNA (DIAGNOSTIC) 1 MG IJ SOLR
INTRAMUSCULAR | Status: DC | PRN
  Administered 2020-07-20: .25 mg via INTRAVENOUS

## 2020-07-20 MED ORDER — CIPROFLOXACIN IN D5W 400 MG/200ML IV SOLN
INTRAVENOUS | Status: AC
  Filled 2020-07-20: qty 200

## 2020-07-20 MED ORDER — PROPOFOL 10 MG/ML IV BOLUS
INTRAVENOUS | Status: DC | PRN
  Administered 2020-07-20: 100 mg via INTRAVENOUS

## 2020-07-20 NOTE — Consult Note (Signed)
NAMELavita Pontius, MRN:  [id number], DOB:  23-Jan-1946, LOS: 1 ADMISSION DATE:  07/19/2020, CONSULTATION DATE:  07/20/2020 REFERRING MD:  Tyrone Nine, MD, CHIEF COMPLAINT:  hypotension   History of Present Illness:  The patient is a 75 year old woman new to me speaking who presents with abdominal pain.  She had recurrent abdominal pain for the past 2 months and has had 2 ED visits and urgent care visit for this complaint.  She was admitted for work-up of her abdominal pain in the setting of syncope.  CT abdomen pelvis showed biliary duct dilation secondary to a 1 cm distal common bile duct stone.  Today she underwent ERCP with partial removal of stone as well as stent placement in the common bile duct.  This evening pulmonary critical care was called for some hypotension after procedure.  History is obtained from discussion with the attending physician, bedside nurse, and the patient's daughter who was at bedside and able to serve as interpreter.  Patient is denying any abdominal pain but she had some nausea earlier this afternoon.  She is feeling chills but otherwise has no complaints  Pertinent  Medical History  Hypertension Hyperlipidemia  Significant Hospital Events: Including procedures, antibiotic start and stop dates in addition to other pertinent events   6/10 admitted with abdominal pain and syncope, found to have biliary duct dilation 6/11 ERCP with partial stone removal and stent placement  Interim History / Subjective:    Objective   Blood pressure (!) 114/48, pulse (!) 55, temperature 98.5 F (36.9 C), temperature source Oral, resp. rate 18, height 5' (1.524 m), weight 52.2 kg, SpO2 100 %.        Intake/Output Summary (Last 24 hours) at 07/20/2020 1825 Last data filed at 07/20/2020 1626 Gross per 24 hour  Intake 653.29 ml  Output 0 ml  Net 653.29 ml   Filed Weights   07/19/20 1624 07/20/20 1041  Weight: 52.6 kg 52.2 kg    Examination: General: Resting comfortably,  no distress HENT: mmm Lungs: No respiratory distress, clear to auscultation bilaterally Cardiovascular: Regular rate and rhythm, no murmurs rubs or gallops Abdomen: Soft, nontender, nondistended Extremities: Warm and dry, no edema Neuro: No focal deficits, moves all 4 extremities, oriented and alert MSK: no Rashes  Labs/imaging that I havepersonally reviewed  (right click and "Reselect all SmartList Selections" daily)  White count elevated, procalcitonin elevated, lactic acid low  Resolved Hospital Problem list     Assessment & Plan:   Mima Cranmore is a 75 year old woman who presents with stone in the common bile duct status post ERCP.  She had some transient hypotension following the procedure as well some nausea.  She is currently feeling some chills but is otherwise feeling all right.  She has been given some IV fluids and her blood pressure has responded appropriately.  She is not tachycardic or tachypneic.  She certainly at risk for biliary sepsis in the setting of ERCP and stent placement.  However right now I think is appropriate to start her on antibiotics with PIP Tazocin as primary team is already done, and give IV fluid resuscitation as needed.  If she develops fever or rigors would get blood cultures.  She has not shock right now and she does not need the ICU.  If there is a change in the clinical situation for the patient please not hesitate to contact us again.  Otherwise we will see as needed.  Durel Salts, MD Pulmonary and Critical Care Medicine  [facility]    Labs   CBC: Recent Labs  Lab 07/19/20 1527 07/20/20 0225 07/20/20 1623  WBC 7.8 13.0* 14.5*  NEUTROABS 6.9  --   --   HGB 13.3 10.1* 11.7*  HCT 41.2 30.8* 35.3*  MCV 89.8 88.5 87.4  PLT 231 168 209    Basic Metabolic Panel: Recent Labs  Lab 07/19/20 1527 07/20/20 0225  NA 133* 136  K 4.0 3.8  CL 94* 101  CO2 20* 23  GLUCOSE 152* 116*  BUN 23 14  CREATININE 1.81* 1.61*  CALCIUM 9.4 8.2*    GFR: Estimated Creatinine Clearance: 21.7 mL/min (A) (by C-G formula based on SCr of 1.61 mg/dL (H)). Recent Labs  Lab 07/19/20 1527 07/20/20 0225 07/20/20 0746 07/20/20 1021 07/20/20 1623  PROCALCITON  --   --   --   --  15.25  WBC 7.8 13.0*  --   --  14.5*  LATICACIDVEN  --   --  0.9 0.9  --     Liver Function Tests: Recent Labs  Lab 07/19/20 1527 07/20/20 0225  AST 189* 276*  ALT 122* 183*  ALKPHOS 322* 269*  BILITOT 2.5* 2.2*  PROT 7.8 5.7*  ALBUMIN 3.9 2.8*   Recent Labs  Lab 07/19/20 1527  LIPASE 63*   No results for input(s): AMMONIA in the last 168 hours.  ABG    Component Value Date/Time   TCO2 26 09/08/2015 1140     Coagulation Profile: Recent Labs  Lab 07/19/20 1922 07/20/20 0225  INR 1.1 1.2    Cardiac Enzymes: No results for input(s): CKTOTAL, CKMB, CKMBINDEX, TROPONINI in the last 168 hours.  HbA1C: Hemoglobin A1C  Date/Time Value Ref Range Status  09/20/2015 12:45 PM 6.0  Final   Hgb A1c MFr Bld  Date/Time Value Ref Range Status  12/19/2018 08:42 AM 6.6 (H) 4.8 - 5.6 % Final    Comment:             Prediabetes: 5.7 - 6.4          Diabetes: >6.4          Glycemic control for adults with diabetes: <7.0     CBG: No results for input(s): GLUCAP in the last 168 hours.  Review of Systems:   +nausea, chills - abdominal pain, diarrhea Otherwise 10 point ROS reviewed and negative  Past Medical History:  She,  has a past medical history of Hyperlipidemia, Hyperlipidemia, familial, high LDL (01/30/2019), and Hypertension.   Surgical History:  History reviewed. No pertinent surgical history.   Social History:   reports that she has never smoked. She has never used smokeless tobacco. She reports that she does not drink alcohol and does not use drugs.   Family History:  No family history of gallstones  Allergies Allergies  Allergen Reactions   Aspirin Nausea And Vomiting     Home Medications  Prior to Admission  medications   Medication Sig Start Date End Date Taking? Authorizing Provider  amLODipine (NORVASC) 5 MG tablet TAKE 1 TABLET(5 MG) BY MOUTH DAILY Patient taking differently: Take 5 mg by mouth daily. 04/01/20  Yes Yates Decamp, MD  lisinopril-hydrochlorothiazide (ZESTORETIC) 20-25 MG tablet TAKE 1 TABLET BY MOUTH DAILY Patient taking differently: Take 1 tablet by mouth daily. 04/01/20  Yes Yates Decamp, MD  metoprolol succinate (TOPROL-XL) 50 MG 24 hr tablet TAKE 1 TABLET BY MOUTH DAILY Patient taking differently: Take 50 mg by mouth daily. 07/01/20  Yes Yates Decamp, MD  omeprazole (PRILOSEC)  20 MG capsule Take 1 capsule (20 mg total) by mouth daily. 07/16/20  Yes Wallis Bamberg, PA-C  prednisoLONE acetate (PRED FORTE) 1 % ophthalmic suspension Place 1 drop into the right eye 4 (four) times daily.   Yes [provider]  rosuvastatin (CRESTOR) 10 MG tablet TAKE 1 TABLET BY MOUTH EVERY NIGHT Patient taking differently: Take 10 mg by mouth every evening. 07/01/20  Yes Yates Decamp, MD  PRALUENT 150 MG/ML SOAJ Inject 150 mLs as directed every 14 (fourteen) days. 06/24/20   Yates Decamp, MD

## 2020-07-20 NOTE — Plan of Care (Signed)

## 2020-07-20 NOTE — Consult Note (Addendum)
Referring Provider:  [facility]         Primary Care Physician:  Shawnee Knapp, MD Primary Gastroenterologist:    Althia Forts         We were asked to see this patient for:  Common bile duct stone(s)             ASSESSMENT / PLAN:   # 75 yo Asian female with symptomatic choledocholithiasis. No evidence for cholelithiasis on CT scan.  --ERCP today.  The benefits and risks of ERCP ( anesthesia risks, bleeding risks, infection risks , pancreatits ) where discussed with patient and daughter. The patient understands and agrees to proceed.      Attending Physician Note   I have taken a history, examined the patient and reviewed the chart. I agree with the Advanced Practitioner's note, impression and recommendations.   Symptomatic choledocholithiasis with elevated LFTs and a dilated biliary tree. Cholelithiasis was not noted on CT AP. Schedule ERCP today. The risks (including pancreatitis, bleeding, perforation, infection, missed lesions, medication reactions and possible hospitalization or surgery if complications occur), benefits, and alternatives to ERCP with possible sphincterotomy, possible stone extraction and possible stent were discussed with the patient and they consent to proceed.    Lucio Edward, MD FACG [phone]     HPI:                                                                                                                             Chief Complaint: Abdominal pain  Becky Turner is a 75 y.o. female with a past medical history significant for HTN, HLD  Patient does not speak [place]. Daughter serves as Astronomer / Optometrist   Patient returned to the ED yesterday following a syncopal episode.  She is still having abdominal pain (LUQ) and constipation.  In the ED she had elevated liver test with alk phos 322, total bili 2.5, AST 189, ALT 122.  She was in AKI.  White count normal at 7.8.  Hemoglobin normal.  Noncon CT scan shows distended gallbladder without  stones or evidence for acute cholecystitis.  Marked biliary duct dilation with CBD of 2.4 cm.  Distal CBD stones of 1.2 cm present.  Patient is not anticoagulated.  Her INR is 1.2.  Patient gives a 6 month history of intermittent non-radiating mid upper abdominal pain, The pain often occurs about 30 minutes after eating. It will usually subside after resting for a while. Over last several days the pain has become severe and associated with dizziness ( upon standing), and nausea / vomiting. She went to Urgent Care a few days ago and was advised to make an appointment with [place] in the clinic.  In the interim the pain became so severe that she had a syncope episode. She has mild constipation, no other GI complaints  Weight is stable.   Today her WBC is elevated at 13. Hgb 13.3 >>> 10 ( after IVF). Liver tests stable (  minor improvement)   PREVIOUS ENDOSCOPIC EVALUATIONS / PERTINENT STUDIES   Never had a colonoscopy     Past Medical History:  Diagnosis Date   Hyperlipidemia    Hyperlipidemia, familial, high LDL 01/30/2019   Hypertension     No past surgical history on file.  Prior to Admission medications   Medication Sig Start Date End Date Taking? Authorizing Provider  amLODipine (NORVASC) 5 MG tablet TAKE 1 TABLET(5 MG) BY MOUTH DAILY Patient taking differently: Take 5 mg by mouth daily. 04/01/20  Yes Adrian Prows, MD  lisinopril-hydrochlorothiazide (ZESTORETIC) 20-25 MG tablet TAKE 1 TABLET BY MOUTH DAILY Patient taking differently: Take 1 tablet by mouth daily. 04/01/20  Yes Adrian Prows, MD  metoprolol succinate (TOPROL-XL) 50 MG 24 hr tablet TAKE 1 TABLET BY MOUTH DAILY Patient taking differently: Take 50 mg by mouth daily. 07/01/20  Yes Adrian Prows, MD  omeprazole (PRILOSEC) 20 MG capsule Take 1 capsule (20 mg total) by mouth daily. 07/16/20  Yes Jaynee Eagles, PA-C  prednisoLONE acetate (PRED FORTE) 1 % ophthalmic suspension Place 1 drop into the right eye 4 (four) times daily.   Yes [provider]  rosuvastatin (CRESTOR) 10 MG tablet TAKE 1 TABLET BY MOUTH EVERY NIGHT Patient taking differently: Take 10 mg by mouth every evening. 07/01/20  Yes Adrian Prows, MD  PRALUENT 150 MG/ML SOAJ Inject 150 mLs as directed every 14 (fourteen) days. 06/24/20   Adrian Prows, MD    Current Facility-Administered Medications  Medication Dose Route Frequency Provider Last Rate Last Admin   0.9 %  sodium chloride infusion   Intravenous Continuous Lenore Cordia, MD 75 mL/hr at 07/20/20 0551 Infusion Verify at 07/20/20 0551   morphine 2 MG/ML injection 1 mg  1 mg Intravenous Q4H PRN Zada Finders R, MD   1 mg at 07/19/20 2219   ondansetron (ZOFRAN) injection 4 mg  4 mg Intravenous Q6H PRN Lenore Cordia, MD        Allergies as of 07/19/2020 - Review Complete 07/19/2020  Allergen Reaction Noted   Aspirin Nausea And Vomiting 09/08/2015    No family history on file.  Social History   Socioeconomic History   Marital status: Married    Spouse name: Not on file   Number of children: 3   Years of education: Not on file   Highest education level: Not on file  Occupational History   Not on file  Tobacco Use   Smoking status: Never   Smokeless tobacco: Never  Substance and Sexual Activity   Alcohol use: No   Drug use: No   Sexual activity: Not on file  Other Topics Concern   Not on file  Social History Narrative   Not on file   Social Determinants of Health   Financial Resource Strain: Not on file  Food Insecurity: Not on file  Transportation Needs: Not on file  Physical Activity: Not on file  Stress: Not on file  Social Connections: Not on file  Intimate Partner Violence: Not on file    Review of Systems: All systems reviewed and negative except where noted in HPI.   OBJECTIVE:    Physical Exam: Vital signs in last 24 hours: Temp:  [97.5 F (36.4 C)-99 F (37.2 C)] 98.1 F (36.7 C) (06/11 0800) Pulse Rate:  [59-88] 59 (06/11 0800) Resp:  [14-24] 20 (06/11  0800) BP: (90-125)/(35-65) 92/40 (06/11 0800) SpO2:  [90 %-100 %] 100 % (06/11 0800) Weight:  [52.6 kg] 52.6 kg (06/10 1624)  Last BM Date:  (pta) General:   Alert  female in NAD Psych:  Pleasant, cooperative. Normal mood and affect. Eyes:  Pupils equal, sclera clear, no icterus.   Conjunctiva pink. Ears:  Normal auditory acuity. Nose:  No deformity, discharge,  or lesions. Neck:  Supple; no masses Lungs:  Clear throughout to auscultation.   No wheezes, crackles, or rhonchi.  Heart:  Regular rate and rhythm;  no lower extremity edema Abdomen:  Soft, non-distended, nontender, BS active, no palp mass   Rectal:  Deferred  Msk:  Symmetrical without gross deformities. . Neurologic:  Alert and  oriented x4;  grossly normal neurologically. Skin:  Intact without significant lesions or rashes.  Filed Weights   07/19/20 1624  Weight: 52.6 kg     Scheduled inpatient medications    Intake/Output from previous day: 06/10 0701 - 06/11 0700 In: 2135.5 [P.O.:60; I.V.:1575.5; IV Piggyback:500] Out: -  Intake/Output this shift: No intake/output data recorded.   Lab Results: Recent Labs    07/19/20 1527 07/20/20 0225  WBC 7.8 13.0*  HGB 13.3 10.1*  HCT 41.2 30.8*  PLT 231 168   BMET Recent Labs    07/19/20 1527 07/20/20 0225  NA 133* 136  K 4.0 3.8  CL 94* 101  CO2 20* 23  GLUCOSE 152* 116*  BUN 23 14  CREATININE 1.81* 1.61*  CALCIUM 9.4 8.2*   LFT Recent Labs    07/20/20 0225  PROT 5.7*  ALBUMIN 2.8*  AST 276*  ALT 183*  ALKPHOS 269*  BILITOT 2.2*   PT/INR Recent Labs    07/19/20 1922 07/20/20 0225  LABPROT 13.9 15.1  INR 1.1 1.2    CBC Latest Ref Rng & Units 07/20/2020 07/19/2020 09/14/2015  WBC 4.0 - 10.5 K/uL 13.0(H) 7.8 7.1  Hemoglobin 12.0 - 15.0 g/dL 10.1(L) 13.3 14.7  Hematocrit 36.0 - 46.0 % 30.8(L) 41.2 43.3  Platelets 150 - 400 K/uL 168 231 -    . CMP Latest Ref Rng & Units 07/20/2020 07/19/2020 01/15/2020  Glucose 70 - 99 mg/dL 116(H) 152(H)  116(H)  BUN 8 - 23 mg/dL _0 Creatinine 0.44 - 1.00 mg/dL 1.61(H) 1.81(H) 1.23(H)  Sodium 135 - 145 mmol/L 136 133(L) 143  Potassium 3.5 - 5.1 mmol/L 3.8 4.0 4.8  Chloride 98 - 111 mmol/L 101 94(L) 96  CO2 22 - 32 mmol/L 23 20(L) 23  Calcium 8.9 - 10.3 mg/dL 8.2(L) 9.4 9.7  Total Protein 6.5 - 8.1 g/dL 5.7(L) 7.8 -  Total Bilirubin 0.3 - 1.2 mg/dL 2.2(H) 2.5(H) -  Alkaline Phos 38 - 126 U/L 269(H) 322(H) -  AST 15 - 41 U/L 276(H) 189(H) -  ALT 0 - 44 U/L 183(H) 122(H) -   Studies/Results: CT Abdomen Pelvis Wo Contrast  Result Date: 07/19/2020 CLINICAL DATA:  Syncope.  Epigastric abdominal pain.  Constipation. EXAM: CT ABDOMEN AND PELVIS WITHOUT CONTRAST TECHNIQUE: Multidetector CT imaging of the abdomen and pelvis was performed following the standard protocol without IV contrast. COMPARISON:  Chest radiograph earlier today. 09/14/2015 abdominal plain film. FINDINGS: Lower chest: Motion degradation. Bibasilar atelectasis. Mild cardiomegaly. Hepatobiliary: No gross liver lesion on this noncontrast CT. The gallbladder is distended, without calcified stone or specific evidence of acute cholecystitis. Marked biliary duct dilatation with the common duct measuring 2.4 cm on 31/6. Followed to the level of a distal common duct stone or stones including up to 1.2 cm on coronal image 30 and transverse image 35. Pancreas: Mild upper abdominal motion degradation. No  pancreatic duct dilatation or acute inflammation. Spleen: Normal in size, without focal abnormality. Adrenals/Urinary Tract: Normal adrenal glands. Too small to characterize lesions in both kidneys. No renal calculi or hydronephrosis. No hydroureter or ureteric calculi. No bladder calculi. Stomach/Bowel: Normal stomach, without wall thickening. 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 Otherwise normal small bowel. Normal colon, appendix, and terminal ileum. Vascular/Lymphatic: Aortic atherosclerosis. No abdominopelvic adenopathy.  Reproductive: Normal uterus and adnexa. Other: No significant free fluid. Mild pelvic floor laxity. No free intraperitoneal air. Tiny fat containing periumbilical hernia. Musculoskeletal: No acute osseous abnormality. IMPRESSION: 1. Marked biliary duct dilatation secondary to a distal common duct stone or stones, on the order of 1.2 cm. 2. Gallbladder distension, without evidence of acute cholecystitis. 3. Mild motion degradation. 4.  Aortic Atherosclerosis (ICD10-I70.0). Electronically Signed   By: Abigail Miyamoto M.D.   On: 07/19/2020 18:07   DG Chest Port 1 View  Result Date: 07/19/2020 CLINICAL DATA:  Abdominal pain and chills, initial encounter EXAM: PORTABLE CHEST 1 VIEW COMPARISON:  11/16/2003 FINDINGS: Cardiac shadow is mildly prominent but stable. Aortic calcifications are again seen. Lungs are clear bilaterally. No acute bony abnormality is noted. Degenerative change of the thoracic spine is seen. IMPRESSION: No acute abnormality noted. Electronically Signed   By: Inez Catalina M.D.   On: 07/19/2020 16:27     Tye Savoy, NP-C @  07/20/2020, 8:48 AM

## 2020-07-20 NOTE — Op Note (Signed)
[facility] Patient Name: Becky Turner Procedure Date : 07/20/2020 MRN: [id number] Attending MD: Meryl Dare , MD Date of Birth: Sep 10, 1945 CSN: [id number] Age: 75 Admit Type: Inpatient Procedure:                ERCP Indications:              Bile duct stone(s), Abdominal pain of suspected                            biliary origin, Elevated liver enzymes Providers:                Venita Lick. Russella Dar, MD, Rozetta Nunnery, Technician,                            Margaree Mackintosh, RN Referring MD:             [facility] Medicines:                General Anesthesia Complications:            No immediate complications. Estimated Blood Loss:     Estimated blood loss was minimal. Procedure:                Pre-Anesthesia Assessment:                           - Prior to the procedure, a History and Physical                            was performed, and patient medications and                            allergies were reviewed. The patient's tolerance of                            previous anesthesia was also reviewed. The risks                            and benefits of the procedure and the sedation                            options and risks were discussed with the patient.                            All questions were answered, and informed consent                            was obtained. Prior Anticoagulants: The patient has                            taken no previous anticoagulant or antiplatelet                            agents. ASA Grade Assessment: III - A patient with  severe systemic disease. After reviewing the risks                            and benefits, the patient was deemed in                            satisfactory condition to undergo the procedure.                           After obtaining informed consent, the scope was                            passed under direct vision. Throughout the                            procedure, the  patient's blood pressure, pulse, and                            oxygen saturations were monitored continuously. The                            [id number]-[id number] ([id number]) Olympus duodenoscope was                            introduced through the mouth, and used to inject                            contrast into and used to inject contrast into the                            bile duct. 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                            The patient tolerated the procedure well. Scope In: Scope Out: Findings:      The scout film was normal. Intubation of the esophagus attempted in the       prone position but was unsuccessful with the duodenoscope. The intial       intubation was in left lateral decubitus with the standard upper       endoscope. The esophagus was then intubated with the duodenoscope with       the patient in the left lateral decubitus position. The scope was  advanced to major papilla in the descending duodenum. Positioning in the       duodenum was in the long position. I could not remain in the duodenum in       the short position. Examination of the pharynx, larynx and associated       structures, and upper GI tract was otherwise normal. 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 A straight Roadrunner wire       first used and was unable to pass into the biliary tree. A Revolution       sphincterotome and wire were then used to achieve wire cannulation. The       short-nosed traction sphincterotome was passed over the guidewire and       the bile duct was then deeply cannulated. Contrast was injected. I       personally interpreted the  bile duct images. The flow of contrast       through the ducts was poor due to severe dilation. The common bile duct       was diffusely dilated, with stones causing an obstruction. The largest       CBD diameter was 24 mm. The stones ranged from 7-12 mm. The left       intrahepatic system was dilated. The right intrahepatic system was not       well filled. An 8 mm biliary sphincterotomy was made with a traction       (standard) sphincterotome using ERBE electrocautery. Unable to safely       further extend the sphincterotomy due to the diverticulum. The       sphincterotomy oozed blood. 5 cc of 1:10,000 epinephrine was injected at       the edges of the sphincterotomy with hemostasis achieved. The biliary       tree was swept with a 12 mm balloon starting at the bifurcation several       times. One stone, 7 mm, was removed. Two larger stones remained.       Positioning was challenging and I was concerned that lithotripsy would       be challenging or not possible in the long position. One 10 Fr by 5 cm       plastic stent with a single external flap and a single internal flap was       placed 4 cm into the common bile duct. Bile flowed through the stent.       The stent was in good position. The PD was not cannulated or injected by       intention. 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                           - The common bile duct was dilated, with stones                            causing an obstruction.                           - Choledocholithiasis was found.  Partial removal                            was accomplished with biliary sphincterotomy,                            balloon clearing; a biliary stent was inserted.                           - A biliary sphincterotomy was performed.                           - The biliary tree was swept.                           - One plastic stent was placed into  the common bile                            duct. Recommendation:           - Avoid aspirin and nonsteroidal anti-inflammatory                            medicines for 1 week.                           - Clear liquid diet today.                           - Return patient to hospital ward for ongoing care.                           - Observe patient's clinical course following                            today's ERCP with therapeutic intervention.                           - Discuss with Dr. Meridee Score regarding repeat ERCP                            in the near future to remove the remaining stones                            and biliary stent. Procedure Code(s):        --- Professional ---                           [id number], Endoscopic retrograde                            cholangiopancreatography (ERCP); with placement of                            endoscopic stent into biliary or pancreatic duct,  including pre- and post-dilation and guide wire                            passage, when performed, including sphincterotomy,                            when performed, each stent                           [id number], Endoscopic retrograde                            cholangiopancreatography (ERCP); with removal of                            calculi/debris from biliary/pancreatic duct(s) Diagnosis Code(s):        --- Professional ---                           K80.51, Calculus of bile duct without cholangitis                            or cholecystitis with obstruction                           R10.9, Unspecified abdominal pain                           R74.8, Abnormal levels of other serum enzymes                           K83.8, Other specified diseases of biliary tract CPT copyright 2019 [employer]. All rights reserved. The codes documented in this report are preliminary and upon coder review may  be revised to meet current compliance requirements. Meryl Dare, MD 07/20/2020 12:59:09 PM This report has been signed electronically. Number of Addenda: 0

## 2020-07-20 NOTE — H&P (View-Only) (Signed)
Referring Provider:  [facility]         Primary Care Physician:  Shawnee Knapp, MD Primary Gastroenterologist:    Althia Forts         We were asked to see this patient for:  Common bile duct stone(s)             ASSESSMENT / PLAN:   # 75 yo Asian female with symptomatic choledocholithiasis. No evidence for cholelithiasis on CT scan.  --ERCP today.  The benefits and risks of ERCP ( anesthesia risks, bleeding risks, infection risks , pancreatits ) where discussed with patient and daughter. The patient understands and agrees to proceed.      Attending Physician Note   I have taken a history, examined the patient and reviewed the chart. I agree with the Advanced Practitioner's note, impression and recommendations.   Symptomatic choledocholithiasis with elevated LFTs and a dilated biliary tree. Cholelithiasis was not noted on CT AP. Schedule ERCP today. The risks (including pancreatitis, bleeding, perforation, infection, missed lesions, medication reactions and possible hospitalization or surgery if complications occur), benefits, and alternatives to ERCP with possible sphincterotomy, possible stone extraction and possible stent were discussed with the patient and they consent to proceed.    Lucio Edward, MD FACG [phone]     HPI:                                                                                                                             Chief Complaint: Abdominal pain  Becky Turner is a 75 y.o. female with a past medical history significant for HTN, HLD  Patient does not speak [place]. Daughter serves as Astronomer / Optometrist   Patient returned to the ED yesterday following a syncopal episode.  She is still having abdominal pain (LUQ) and constipation.  In the ED she had elevated liver test with alk phos 322, total bili 2.5, AST 189, ALT 122.  She was in AKI.  White count normal at 7.8.  Hemoglobin normal.  Noncon CT scan shows distended gallbladder without  stones or evidence for acute cholecystitis.  Marked biliary duct dilation with CBD of 2.4 cm.  Distal CBD stones of 1.2 cm present.  Patient is not anticoagulated.  Her INR is 1.2.  Patient gives a 6 month history of intermittent non-radiating mid upper abdominal pain, The pain often occurs about 30 minutes after eating. It will usually subside after resting for a while. Over last several days the pain has become severe and associated with dizziness ( upon standing), and nausea / vomiting. She went to Urgent Care a few days ago and was advised to make an appointment with [place] in the clinic.  In the interim the pain became so severe that she had a syncope episode. She has mild constipation, no other GI complaints  Weight is stable.   Today her WBC is elevated at 13. Hgb 13.3 >>> 10 ( after IVF). Liver tests stable (  minor improvement)   PREVIOUS ENDOSCOPIC EVALUATIONS / PERTINENT STUDIES   Never had a colonoscopy     Past Medical History:  Diagnosis Date   Hyperlipidemia    Hyperlipidemia, familial, high LDL 01/30/2019   Hypertension     No past surgical history on file.  Prior to Admission medications   Medication Sig Start Date End Date Taking? Authorizing Provider  amLODipine (NORVASC) 5 MG tablet TAKE 1 TABLET(5 MG) BY MOUTH DAILY Patient taking differently: Take 5 mg by mouth daily. 04/01/20  Yes Adrian Prows, MD  lisinopril-hydrochlorothiazide (ZESTORETIC) 20-25 MG tablet TAKE 1 TABLET BY MOUTH DAILY Patient taking differently: Take 1 tablet by mouth daily. 04/01/20  Yes Adrian Prows, MD  metoprolol succinate (TOPROL-XL) 50 MG 24 hr tablet TAKE 1 TABLET BY MOUTH DAILY Patient taking differently: Take 50 mg by mouth daily. 07/01/20  Yes Adrian Prows, MD  omeprazole (PRILOSEC) 20 MG capsule Take 1 capsule (20 mg total) by mouth daily. 07/16/20  Yes Jaynee Eagles, PA-C  prednisoLONE acetate (PRED FORTE) 1 % ophthalmic suspension Place 1 drop into the right eye 4 (four) times daily.   Yes [provider]  rosuvastatin (CRESTOR) 10 MG tablet TAKE 1 TABLET BY MOUTH EVERY NIGHT Patient taking differently: Take 10 mg by mouth every evening. 07/01/20  Yes Adrian Prows, MD  PRALUENT 150 MG/ML SOAJ Inject 150 mLs as directed every 14 (fourteen) days. 06/24/20   Adrian Prows, MD    Current Facility-Administered Medications  Medication Dose Route Frequency Provider Last Rate Last Admin   0.9 %  sodium chloride infusion   Intravenous Continuous Lenore Cordia, MD 75 mL/hr at 07/20/20 0551 Infusion Verify at 07/20/20 0551   morphine 2 MG/ML injection 1 mg  1 mg Intravenous Q4H PRN Zada Finders R, MD   1 mg at 07/19/20 2219   ondansetron (ZOFRAN) injection 4 mg  4 mg Intravenous Q6H PRN Lenore Cordia, MD        Allergies as of 07/19/2020 - Review Complete 07/19/2020  Allergen Reaction Noted   Aspirin Nausea And Vomiting 09/08/2015    No family history on file.  Social History   Socioeconomic History   Marital status: Married    Spouse name: Not on file   Number of children: 3   Years of education: Not on file   Highest education level: Not on file  Occupational History   Not on file  Tobacco Use   Smoking status: Never   Smokeless tobacco: Never  Substance and Sexual Activity   Alcohol use: No   Drug use: No   Sexual activity: Not on file  Other Topics Concern   Not on file  Social History Narrative   Not on file   Social Determinants of Health   Financial Resource Strain: Not on file  Food Insecurity: Not on file  Transportation Needs: Not on file  Physical Activity: Not on file  Stress: Not on file  Social Connections: Not on file  Intimate Partner Violence: Not on file    Review of Systems: All systems reviewed and negative except where noted in HPI.   OBJECTIVE:    Physical Exam: Vital signs in last 24 hours: Temp:  [97.5 F (36.4 C)-99 F (37.2 C)] 98.1 F (36.7 C) (06/11 0800) Pulse Rate:  [59-88] 59 (06/11 0800) Resp:  [14-24] 20 (06/11  0800) BP: (90-125)/(35-65) 92/40 (06/11 0800) SpO2:  [90 %-100 %] 100 % (06/11 0800) Weight:  [52.6 kg] 52.6 kg (06/10 1624)  Last BM Date:  (pta) General:   Alert  female in NAD Psych:  Pleasant, cooperative. Normal mood and affect. Eyes:  Pupils equal, sclera clear, no icterus.   Conjunctiva pink. Ears:  Normal auditory acuity. Nose:  No deformity, discharge,  or lesions. Neck:  Supple; no masses Lungs:  Clear throughout to auscultation.   No wheezes, crackles, or rhonchi.  Heart:  Regular rate and rhythm;  no lower extremity edema Abdomen:  Soft, non-distended, nontender, BS active, no palp mass   Rectal:  Deferred  Msk:  Symmetrical without gross deformities. . Neurologic:  Alert and  oriented x4;  grossly normal neurologically. Skin:  Intact without significant lesions or rashes.  Filed Weights   07/19/20 1624  Weight: 52.6 kg     Scheduled inpatient medications    Intake/Output from previous day: 06/10 0701 - 06/11 0700 In: 2135.5 [P.O.:60; I.V.:1575.5; IV Piggyback:500] Out: -  Intake/Output this shift: No intake/output data recorded.   Lab Results: Recent Labs    07/19/20 1527 07/20/20 0225  WBC 7.8 13.0*  HGB 13.3 10.1*  HCT 41.2 30.8*  PLT 231 168   BMET Recent Labs    07/19/20 1527 07/20/20 0225  NA 133* 136  K 4.0 3.8  CL 94* 101  CO2 20* 23  GLUCOSE 152* 116*  BUN 23 14  CREATININE 1.81* 1.61*  CALCIUM 9.4 8.2*   LFT Recent Labs    07/20/20 0225  PROT 5.7*  ALBUMIN 2.8*  AST 276*  ALT 183*  ALKPHOS 269*  BILITOT 2.2*   PT/INR Recent Labs    07/19/20 1922 07/20/20 0225  LABPROT 13.9 15.1  INR 1.1 1.2    CBC Latest Ref Rng & Units 07/20/2020 07/19/2020 09/14/2015  WBC 4.0 - 10.5 K/uL 13.0(H) 7.8 7.1  Hemoglobin 12.0 - 15.0 g/dL 10.1(L) 13.3 14.7  Hematocrit 36.0 - 46.0 % 30.8(L) 41.2 43.3  Platelets 150 - 400 K/uL 168 231 -    . CMP Latest Ref Rng & Units 07/20/2020 07/19/2020 01/15/2020  Glucose 70 - 99 mg/dL 116(H) 152(H)  116(H)  BUN 8 - 23 mg/dL _0 Creatinine 0.44 - 1.00 mg/dL 1.61(H) 1.81(H) 1.23(H)  Sodium 135 - 145 mmol/L 136 133(L) 143  Potassium 3.5 - 5.1 mmol/L 3.8 4.0 4.8  Chloride 98 - 111 mmol/L 101 94(L) 96  CO2 22 - 32 mmol/L 23 20(L) 23  Calcium 8.9 - 10.3 mg/dL 8.2(L) 9.4 9.7  Total Protein 6.5 - 8.1 g/dL 5.7(L) 7.8 -  Total Bilirubin 0.3 - 1.2 mg/dL 2.2(H) 2.5(H) -  Alkaline Phos 38 - 126 U/L 269(H) 322(H) -  AST 15 - 41 U/L 276(H) 189(H) -  ALT 0 - 44 U/L 183(H) 122(H) -   Studies/Results: CT Abdomen Pelvis Wo Contrast  Result Date: 07/19/2020 CLINICAL DATA:  Syncope.  Epigastric abdominal pain.  Constipation. EXAM: CT ABDOMEN AND PELVIS WITHOUT CONTRAST TECHNIQUE: Multidetector CT imaging of the abdomen and pelvis was performed following the standard protocol without IV contrast. COMPARISON:  Chest radiograph earlier today. 09/14/2015 abdominal plain film. FINDINGS: Lower chest: Motion degradation. Bibasilar atelectasis. Mild cardiomegaly. Hepatobiliary: No gross liver lesion on this noncontrast CT. The gallbladder is distended, without calcified stone or specific evidence of acute cholecystitis. Marked biliary duct dilatation with the common duct measuring 2.4 cm on 31/6. Followed to the level of a distal common duct stone or stones including up to 1.2 cm on coronal image 30 and transverse image 35. Pancreas: Mild upper abdominal motion degradation. No  pancreatic duct dilatation or acute inflammation. Spleen: Normal in size, without focal abnormality. Adrenals/Urinary Tract: Normal adrenal glands. Too small to characterize lesions in both kidneys. No renal calculi or hydronephrosis. No hydroureter or ureteric calculi. No bladder calculi. Stomach/Bowel: Normal stomach, without wall thickening. 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 Otherwise normal small bowel. Normal colon, appendix, and terminal ileum. Vascular/Lymphatic: Aortic atherosclerosis. No abdominopelvic adenopathy.  Reproductive: Normal uterus and adnexa. Other: No significant free fluid. Mild pelvic floor laxity. No free intraperitoneal air. Tiny fat containing periumbilical hernia. Musculoskeletal: No acute osseous abnormality. IMPRESSION: 1. Marked biliary duct dilatation secondary to a distal common duct stone or stones, on the order of 1.2 cm. 2. Gallbladder distension, without evidence of acute cholecystitis. 3. Mild motion degradation. 4.  Aortic Atherosclerosis (ICD10-I70.0). Electronically Signed   By: Abigail Miyamoto M.D.   On: 07/19/2020 18:07   DG Chest Port 1 View  Result Date: 07/19/2020 CLINICAL DATA:  Abdominal pain and chills, initial encounter EXAM: PORTABLE CHEST 1 VIEW COMPARISON:  11/16/2003 FINDINGS: Cardiac shadow is mildly prominent but stable. Aortic calcifications are again seen. Lungs are clear bilaterally. No acute bony abnormality is noted. Degenerative change of the thoracic spine is seen. IMPRESSION: No acute abnormality noted. Electronically Signed   By: Inez Catalina M.D.   On: 07/19/2020 16:27     Tye Savoy, NP-C @  07/20/2020, 8:48 AM

## 2020-07-20 NOTE — Progress Notes (Signed)
S:  The patient's blood pressure continues to be low throughout her stay here despite ~2L IVF. She remains drowsy though daughter reports she's not confused and mentating at baseline. Urine output is subjectively normal and not discolored. No evidence of bleeding. She's vomited twice since ERCP this morning but continues to deny abdominal discomfort at all.   O: Tmax 98.49F  BP's today: 92/40; 118/38; 109/42; 118/39; 114/48 Appears fatigued but responsive in no distress HR remaining borderline bradycardic, regular without any peripheral edema.  Lungs clear, nonlabored. Abd soft without tenderness, nondistended, +BS. Oriented  WBC 7 > 13 > 14.5 Lactic acid 0.9 x2  PCT 15.25  A/P: 75yo F with resistant HTN at baseline (on norvasc, lisinopril, HCTZ, metoprolol) with continued hypotension since admission for obstructive jaundice due to choledocholithiasis. Hypotension has continued despite ~2L IVF as infusion. Mentation maintained, UOP adequate, lactic acid has been normal x2 this early AM.   WBC rising, though pt afebrile with benign abdomen. Suspect inflammatory response to instrumentation, hopefully not developing biliary sepsis. Certainly also dehydrated. Low suspicion for post-ERCP pancreatitis this soon and with benign abdomen. No current evidence of end-organ hypoperfusion currently. - Start zosyn. If febrile, check blood cultures. - Monitor abdominal exam. - BP remains with MAP in low-mid 60's after 500cc LR bolus. Repeat bolus and monitor BP.  - D/w PCCM. I appreciate their evaluation.   Total critical care time: 35 minutes  Due to a high probability of clinically significant, life threatening deterioration, the patient required my highest level of preparedness to intervene emergently and I personally spent this critical care time directly and personally managing the patient. This critical care time included obtaining a history; examining the patient; pulse oximetry; ordering and  review of studies; arranging urgent treatment with development of a management plan; evaluation of patient's response to treatment; frequent reassessment; and, discussions with other providers. This critical care time was performed to assess and manage the high probability of imminent, life-threatening deterioration that could result in multi-organ failure. It was exclusive of separately billable procedures and treating other patients and teaching time.  Becky Junker, MD 07/20/2020 6:12 PM

## 2020-07-20 NOTE — Progress Notes (Signed)
Pharmacy Antibiotic Note  Becky Turner is a 75 y.o. female admitted on 07/19/2020 with Choledocholithiasis with biliary obstruction s/p ERCP 6/11. Pharmacy has been consulted for piperacillin/tazobactam dosing.  ClCr ~21 ml/min, Scr trending down. Will watch closely and adjust if worsens. WBC 14.5  Plan: Zosyn 3.375g IV q8h (4 hour infusion). Monitor cultures, clinical status, renal fx Narrow abx as able and f/u duration   Height: 5' (152.4 cm) Weight: 52.2 kg (115 lb) IBW/kg (Calculated) : 45.5  Temp (24hrs), Avg:98.2 F (36.8 C), Min:97.5 F (36.4 C), Max:99 F (37.2 C)  Recent Labs  Lab 07/19/20 1527 07/20/20 0225 07/20/20 0746 07/20/20 1021 07/20/20 1623  WBC 7.8 13.0*  --   --  14.5*  CREATININE 1.81* 1.61*  --   --   --   LATICACIDVEN  --   --  0.9 0.9  --     Estimated Creatinine Clearance: 21.7 mL/min (A) (by C-G formula based on SCr of 1.61 mg/dL (H)).    Allergies  Allergen Reactions   Aspirin Nausea And Vomiting    Antimicrobials this admission: cipro 6/11 (preop) piptazo 6/11 >>    Thank you for allowing pharmacy to be a part of this patient's care.   Alphia Moh, PharmD, BCPS, BCCP Clinical Pharmacist  Please check AMION for all [facility] Pharmacy phone numbers After 10:00 PM, call Main Pharmacy [phone]

## 2020-07-20 NOTE — Interval H&P Note (Signed)
History and Physical Interval Note:  07/20/2020 10:50 AM  Becky Turner  has presented today for surgery, with the diagnosis of CBD stones.  The various methods of treatment have been discussed with the patient and family. After consideration of risks, benefits and other options for treatment, the patient has consented to  Procedure(s): ENDOSCOPIC RETROGRADE CHOLANGIOPANCREATOGRAPHY (ERCP) (N/A) as a surgical intervention.  The patient's history has been reviewed, patient examined, no change in status, stable for surgery.  I have reviewed the patient's chart and labs.  Questions were answered to the patient's satisfaction.     Venita Lick. Russella Dar

## 2020-07-20 NOTE — Anesthesia Postprocedure Evaluation (Signed)
Anesthesia Post Note  Patient: Becky Turner  Procedure(s) Performed: ENDOSCOPIC RETROGRADE CHOLANGIOPANCREATOGRAPHY (ERCP) SPHINCTEROTOMY REMOVAL OF STONES HEMOSTASIS CONTROL BILIARY STENT PLACEMENT     Patient location during evaluation: PACU Anesthesia Type: General Level of consciousness: awake and alert, patient cooperative and oriented Pain management: pain level controlled Vital Signs Assessment: post-procedure vital signs reviewed and stable Respiratory status: spontaneous breathing, nonlabored ventilation and respiratory function stable Cardiovascular status: blood pressure returned to baseline and stable Postop Assessment: no apparent nausea or vomiting Anesthetic complications: no   No notable events documented.  Last Vitals:  Vitals:   07/20/20 1318 07/20/20 1335  BP: (!) 105/47 (!) 118/39  Pulse: (!) 57 (!) 56  Resp: 16 18  Temp: 36.7 C 36.7 C  SpO2: 97% 95%    Last Pain:  Vitals:   07/20/20 1335  TempSrc: Oral  PainSc:                  Ytzel Gubler,E. Paiden Caraveo

## 2020-07-20 NOTE — Transfer of Care (Signed)
Immediate Anesthesia Transfer of Care Note  Patient: Becky Turner  Procedure(s) Performed: ENDOSCOPIC RETROGRADE CHOLANGIOPANCREATOGRAPHY (ERCP) SPHINCTEROTOMY REMOVAL OF STONES HEMOSTASIS CONTROL BILIARY STENT PLACEMENT  Patient Location: PACU  Anesthesia Type:General  Level of Consciousness: awake, alert  and oriented  Airway & Oxygen Therapy: Patient Spontanous Breathing  Post-op Assessment: Report given to RN and Post -op Vital signs reviewed and stable  Post vital signs: Reviewed and stable  Last Vitals:  Vitals Value Taken Time  BP 155/136 07/20/20 1248  Temp    Pulse 66 07/20/20 1248  Resp 19 07/20/20 1248  SpO2 97 % 07/20/20 1248  Vitals shown include unvalidated device data.  Last Pain:  Vitals:   07/20/20 1041  TempSrc: Oral  PainSc: 0-No pain      Patients Stated Pain Goal: 2 (07/19/20 2219)  Complications: No notable events documented.

## 2020-07-20 NOTE — Progress Notes (Addendum)
PROGRESS NOTE  Becky Turner  BPZ:[id number] DOB: Oct 29, 1945 DOA: 07/19/2020 PCP: Sherren Mocha, MD  Outpatient Specialists: Cardiology, Dr. Jacinto Halim Brief Narrative: Becky Turner is a 75 y.o. female with a history of HTN, HLD, stage IIIa CKD who presented to the ED 6/10 with near syncopal event and several weeks of epigastric pain with nausea. She was hypotensive and afebrile with LFT elevations, Creatinine elevated to 1.81 above baseline ~1.2, hs Troponin 13 and, WBC 7.8k, hgb 13.3g/dl. CT abd/pelvis revealed marked biliary ductal dilatation and distal CBD stones up to 1.2cm. IVF given and patient was taken to ERCP the following morning where some stones were removed and plastic stent placed. The procedure was made difficult due to papilla location within diverticulum. Hospital stay complicated by hypotension.  Assessment & Plan: Principal Problem:   Choledocholithiasis Active Problems:   Essential hypertension   Hyperlipidemia, familial, high LDL   Acute kidney injury superimposed on CKD (HCC)   Pain of upper abdomen   Elevated LFTs   Bleeding from sphincterotomy site  Choledocholithiasis with biliary obstruction: s/p partial clearing at ERCP 6/11 with sphincterotomy, plastic CBD stent placement.  - Clear liquid diet per GI - Monitor for pancreatitis, etc.  - Repeat CMP in AM - Await further recommendations from GI regarding timing of repeat procedure for extraction of remaining stones/stent.   Hypotension: Asymptomatic. Lactic acid continues to confirm adequate perfusion. No history of this.  - Hgb down though this was after IVF, no overt bleeding. Recheck CBC - WBC up since admission. Check PCT. Low threshold to give zosyn.  - Will give fluid bolus and monitor, continue IV gtt. May consider midodrine to support renal perfusion.   AKI on stage IIIa CKD:  - Hold diuretic, give IVF, support BP.  Essential HTN: In setting of hypotension, will of course hold home antihypertensives.   s/p right  corneal transplant:  - Continue home prednisone gtt  Hyperlipidemia:  - Return to regular praluent infusions as outpatient after discharge.   DVT prophylaxis: SCDs Code Status: Full Family Communication: Daughter at bedside Disposition Plan:  Status is: Inpatient  Remains inpatient appropriate because:Ongoing diagnostic testing needed not appropriate for outpatient work up and IV treatments appropriate due to intensity of illness or inability to take PO  Dispo: The patient is from: Home              Anticipated d/c is to: Home              Patient currently is not medically stable to d/c.   Difficult to place patient No  Consultants:  [facility]  Procedures:  07/20/2020 EGD Dr. 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                           - The common bile duct was dilated, with stones                           causing an obstruction.                           - Choledocholithiasis was found. Partial removal  was accomplished with biliary sphincterotomy,                           balloon clearing; a biliary stent was inserted.                           - A biliary sphincterotomy was performed.                           - The biliary tree was swept.                           - One plastic stent was placed into the common bile                           duct. Recommendation: - Avoid aspirin and nonsteroidal anti-inflammatory                           medicines for 1 week.                           - Clear liquid diet today.                           - Return patient to hospital ward for ongoing care.                           - Observe patient's clinical course following                           today's ERCP with therapeutic intervention.                           - Discuss with Dr. Meridee Score regarding repeat ERCP in the near future to remove the remaining stones and  biliary stent.  Antimicrobials: None   Subjective: Pt tired after ERCP, had episode of emesis but has no abdominal pain or fever or bleeding or other complaints.   Objective: Vitals:   07/20/20 1248 07/20/20 1303 07/20/20 1318 07/20/20 1335  BP: (!) 109/42 (!) 107/49 (!) 105/47 (!) 118/39  Pulse: 74 (!) 59 (!) 57 (!) 56  Resp: 18 17 16 18   Temp: 98.9 F (37.2 C)  98 F (36.7 C) 98 F (36.7 C)  TempSrc:    Oral  SpO2: 97% 96% 97% 95%  Weight:      Height:        Intake/Output Summary (Last 24 hours) at 07/20/2020 1552 Last data filed at 07/20/2020 1230 Gross per 24 hour  Intake 2135.47 ml  Output 0 ml  Net 2135.47 ml   Filed Weights   07/19/20 1624 07/20/20 1041  Weight: 52.6 kg 52.2 kg    Gen: 75 y.o. female in no distress Pulm: Non-labored breathing room air. Clear to auscultation bilaterally.  CV: Regular borderline bradycardia. No murmur, rub, or gallop. No JVD, no pedal edema. GI: Abdomen soft, very mildly tender in RUQ, otherwise non-tender, non-distended, with normoactive bowel sounds. No organomegaly or masses felt. Ext: Warm, no deformities Skin: No rashes, lesions or ulcers on visualized skin Neuro: Alert  and oriented. No focal neurological deficits. Psych: Judgement and insight appear normal. Mood & affect appropriate.   Data Reviewed: I have personally reviewed following labs and imaging studies  CBC: Recent Labs  Lab 07/19/20 1527 07/20/20 0225  WBC 7.8 13.0*  NEUTROABS 6.9  --   HGB 13.3 10.1*  HCT 41.2 30.8*  MCV 89.8 88.5  PLT 231 168   Basic Metabolic Panel: Recent Labs  Lab 07/19/20 1527 07/20/20 0225  NA 133* 136  K 4.0 3.8  CL 94* 101  CO2 20* 23  GLUCOSE 152* 116*  BUN 23 14  CREATININE 1.81* 1.61*  CALCIUM 9.4 8.2*   GFR: Estimated Creatinine Clearance: 21.7 mL/min (A) (by C-G formula based on SCr of 1.61 mg/dL (H)). Liver Function Tests: Recent Labs  Lab 07/19/20 1527 07/20/20 0225  AST 189* 276*  ALT 122* 183*   ALKPHOS 322* 269*  BILITOT 2.5* 2.2*  PROT 7.8 5.7*  ALBUMIN 3.9 2.8*   Recent Labs  Lab 07/19/20 1527  LIPASE 63*   No results for input(s): AMMONIA in the last 168 hours. Coagulation Profile: Recent Labs  Lab 07/19/20 1922 07/20/20 0225  INR 1.1 1.2   Cardiac Enzymes: No results for input(s): CKTOTAL, CKMB, CKMBINDEX, TROPONINI in the last 168 hours. BNP (last 3 results) No results for input(s): PROBNP in the last 8760 hours. HbA1C: No results for input(s): HGBA1C in the last 72 hours. CBG: No results for input(s): GLUCAP in the last 168 hours. Lipid Profile: No results for input(s): CHOL, HDL, LDLCALC, TRIG, CHOLHDL, LDLDIRECT in the last 72 hours. Thyroid Function Tests: No results for input(s): TSH, T4TOTAL, FREET4, T3FREE, THYROIDAB in the last 72 hours. Anemia Panel: No results for input(s): VITAMINB12, FOLATE, FERRITIN, TIBC, IRON, RETICCTPCT in the last 72 hours. Urine analysis:    Component Value Date/Time   COLORURINE YELLOW 07/19/2020 1527   APPEARANCEUR CLEAR 07/19/2020 1527   LABSPEC 1.008 07/19/2020 1527   PHURINE 5.0 07/19/2020 1527   GLUCOSEU NEGATIVE 07/19/2020 1527   HGBUR SMALL (A) 07/19/2020 1527   BILIRUBINUR NEGATIVE 07/19/2020 1527   BILIRUBINUR negative 09/14/2015 0856   KETONESUR 20 (A) 07/19/2020 1527   PROTEINUR NEGATIVE 07/19/2020 1527   UROBILINOGEN 0.2 09/14/2015 0856   NITRITE NEGATIVE 07/19/2020 1527   LEUKOCYTESUR NEGATIVE 07/19/2020 1527   Recent Results (from the past 240 hour(s))  Resp Panel by RT-PCR (Flu A&B, Covid) Nasopharyngeal Swab     Status: None   Collection Time: 07/19/20  8:20 PM   Specimen: Nasopharyngeal Swab; Nasopharyngeal(NP) swabs in vial transport medium  Result Value Ref Range Status   SARS Coronavirus 2 by RT PCR NEGATIVE NEGATIVE Final    Comment: (NOTE) SARS-CoV-2 target nucleic acids are NOT DETECTED.  The SARS-CoV-2 RNA is generally detectable in upper respiratory specimens during the acute phase  of infection. The lowest concentration of SARS-CoV-2 viral copies this assay can detect is 138 copies/mL. A negative result does not preclude SARS-Cov-2 infection and should not be used as the sole basis for treatment or other patient management decisions. A negative result may occur with  improper specimen collection/handling, submission of specimen other than nasopharyngeal swab, presence of viral mutation(s) within the areas targeted by this assay, and inadequate number of viral copies(<138 copies/mL). A negative result must be combined with clinical observations, patient history, and epidemiological information. The expected result is Negative.  Fact Sheet for Patients:  BloggerCourse.com  Fact Sheet for Healthcare Providers:  SeriousBroker.it  This test is no t yet approved  or cleared by the [place] FDA and  has been authorized for detection and/or diagnosis of SARS-CoV-2 by FDA under an Emergency Use Authorization (EUA). This EUA will remain  in effect (meaning this test can be used) for the duration of the COVID-19 declaration under Section 564(b)(1) of the Act, 21 U.S.C.section 360bbb-3(b)(1), unless the authorization is terminated  or revoked sooner.       Influenza A by PCR NEGATIVE NEGATIVE Final   Influenza B by PCR NEGATIVE NEGATIVE Final    Comment: (NOTE) The Xpert Xpress SARS-CoV-2/FLU/RSV plus assay is intended as an aid in the diagnosis of influenza from Nasopharyngeal swab specimens and should not be used as a sole basis for treatment. Nasal washings and aspirates are unacceptable for Xpert Xpress SARS-CoV-2/FLU/RSV testing.  Fact Sheet for Patients: BloggerCourse.comhttps://www.fda.gov/media/152166/download  Fact Sheet for Healthcare Providers: SeriousBroker.ithttps://www.fda.gov/media/152162/download  This test is not yet approved or cleared by the [place] FDA and has been authorized for detection and/or diagnosis of  SARS-CoV-2 by FDA under an Emergency Use Authorization (EUA). This EUA will remain in effect (meaning this test can be used) for the duration of the COVID-19 declaration under Section 564(b)(1) of the Act, 21 U.S.C. section 360bbb-3(b)(1), unless the authorization is terminated or revoked.  Performed at [facility], [address] [place], [place] [address]       Radiology Studies: CT Abdomen Pelvis Wo Contrast  Result Date: 07/19/2020 CLINICAL DATA:  Syncope.  Epigastric abdominal pain.  Constipation. EXAM: CT ABDOMEN AND PELVIS WITHOUT CONTRAST TECHNIQUE: Multidetector CT imaging of the abdomen and pelvis was performed following the standard protocol without IV contrast. COMPARISON:  Chest radiograph earlier today. 09/14/2015 abdominal plain film. FINDINGS: Lower chest: Motion degradation. Bibasilar atelectasis. Mild cardiomegaly. Hepatobiliary: No gross liver lesion on this noncontrast CT. The gallbladder is distended, without calcified stone or specific evidence of acute cholecystitis. Marked biliary duct dilatation with the common duct measuring 2.4 cm on 31/6. Followed to the level of a distal common duct stone or stones including up to 1.2 cm on coronal image 30 and transverse image 35. Pancreas: Mild upper abdominal motion degradation. No pancreatic duct dilatation or acute inflammation. Spleen: Normal in size, without focal abnormality. Adrenals/Urinary Tract: Normal adrenal glands. Too small to characterize lesions in both kidneys. No renal calculi or hydronephrosis. No hydroureter or ureteric calculi. No bladder calculi. Stomach/Bowel: Normal stomach, without wall thickening. 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 Otherwise normal small bowel. Normal colon, appendix, and terminal ileum. Vascular/Lymphatic: Aortic atherosclerosis. No abdominopelvic adenopathy. Reproductive: Normal uterus and adnexa. Other: No significant free fluid. Mild pelvic floor laxity. No free  intraperitoneal air. Tiny fat containing periumbilical hernia. Musculoskeletal: No acute osseous abnormality. IMPRESSION: 1. Marked biliary duct dilatation secondary to a distal common duct stone or stones, on the order of 1.2 cm. 2. Gallbladder distension, without evidence of acute cholecystitis. 3. Mild motion degradation. 4.  Aortic Atherosclerosis (ICD10-I70.0). Electronically Signed   By: Jeronimo GreavesKyle  Talbot M.D.   On: 07/19/2020 18:07   DG Chest Port 1 View  Result Date: 07/19/2020 CLINICAL DATA:  Abdominal pain and chills, initial encounter EXAM: PORTABLE CHEST 1 VIEW COMPARISON:  11/16/2003 FINDINGS: Cardiac shadow is mildly prominent but stable. Aortic calcifications are again seen. Lungs are clear bilaterally. No acute bony abnormality is noted. Degenerative change of the thoracic spine is seen. IMPRESSION: No acute abnormality noted. Electronically Signed   By: Alcide CleverMark  Lukens M.D.   On: 07/19/2020 16:27   DG ERCP BILIARY & PANCREATIC DUCTS  Result Date: 07/20/2020 CLINICAL DATA:  75 year old female with a history of biliary ductal dilatation EXAM: ERCP TECHNIQUE: Multiple spot images obtained with the fluoroscopic device and submitted for interpretation post-procedure. FLUOROSCOPY TIME:  Fluoroscopy Time:  4 minutes 11 seconds COMPARISON:  CT 07/19/2020 FINDINGS: Limited intraoperative fluoroscopic spot images during ERCP. Initial image demonstrates the endoscope projecting over the upper abdomen. Subsequently there is cannulation of the ampulla with retrograde infusion of contrast. Final image demonstrates placement of a plastic biliary stent. The ill-defined filling defects in the ductal system just above the ampulla may represent air bubbles or debris/stones. IMPRESSION: Limited images during ERCP demonstrates findings as above, with placement of plastic biliary stent. Signed, Yvone Neu. Reyne Dumas, RPVI [facility] Inter[facility] Radiology Specialists [facility] Radiology Electronically Signed   By:  Gilmer Mor D.O.   On: 07/20/2020 13:08    Scheduled Meds: Continuous Infusions:   LOS: 1 day   Time spent: 35 minutes.  Tyrone Nine, MD [facility] [email or web address] 07/20/2020, 3:52 PM

## 2020-07-20 NOTE — Anesthesia Preprocedure Evaluation (Addendum)
Anesthesia Evaluation  Patient identified by MRN, date of birth, ID band Patient awake    Reviewed: Allergy & Precautions, NPO status , Patient's Chart, lab work & pertinent test results, reviewed documented beta blocker date and time   History of Anesthesia Complications Negative for: history of anesthetic complications  Airway Mallampati: II  TM Distance: >3 FB Neck ROM: Full    Dental  (+) Dental Advisory Given, Chipped   Pulmonary neg pulmonary ROS,  07/19/2020 SARS coronavirus NEG   breath sounds clear to auscultation       Cardiovascular hypertension, Pt. on medications and Pt. on home beta blockers (-) angina Rhythm:Regular Rate:Normal  '17 Myoview: normal study, no ischemia, EF 59%   Neuro/Psych negative neurological ROS     GI/Hepatic GERD  Medicated,CBD stone: elevated LFTs N/v with acute cholecystits   Endo/Other  negative endocrine ROS  Renal/GU Renal InsufficiencyRenal disease     Musculoskeletal   Abdominal   Peds  Hematology  (+) Blood dyscrasia (Hb 10.1), anemia ,   Anesthesia Other Findings   Reproductive/Obstetrics                            Anesthesia Physical Anesthesia Plan  ASA: 3  Anesthesia Plan: General   Post-op Pain Management:    Induction: Intravenous and Rapid sequence  PONV Risk Score and Plan: 3 and Ondansetron, Dexamethasone and Treatment may vary due to age or medical condition  Airway Management Planned: Oral ETT  Additional Equipment: None  Intra-op Plan:   Post-operative Plan: Extubation in OR  Informed Consent: I have reviewed the patients History and Physical, chart, labs and discussed the procedure including the risks, benefits and alternatives for the proposed anesthesia with the patient or authorized representative who has indicated his/her understanding and acceptance.     Dental advisory given and Interpreter used for  interveiw  Plan Discussed with: CRNA and Surgeon  Anesthesia Plan Comments: (Patient's Daughter interpreting)       Anesthesia Quick Evaluation

## 2020-07-21 LAB — COMPREHENSIVE METABOLIC PANEL
ALT: 94 U/L — ABNORMAL HIGH (ref 0–44)
AST: 72 U/L — ABNORMAL HIGH (ref 15–41)
Albumin: 2.1 g/dL — ABNORMAL LOW (ref 3.5–5.0)
Alkaline Phosphatase: 184 U/L — ABNORMAL HIGH (ref 38–126)
Anion gap: 6 (ref 5–15)
BUN: 12 mg/dL (ref 8–23)
CO2: 27 mmol/L (ref 22–32)
Calcium: 7.6 mg/dL — ABNORMAL LOW (ref 8.9–10.3)
Chloride: 102 mmol/L (ref 98–111)
Creatinine, Ser: 1.5 mg/dL — ABNORMAL HIGH (ref 0.44–1.00)
GFR, Estimated: 36 mL/min — ABNORMAL LOW (ref >60)
Glucose, Bld: 107 mg/dL — ABNORMAL HIGH (ref 70–99)
Potassium: 3.3 mmol/L — ABNORMAL LOW (ref 3.5–5.1)
Sodium: 135 mmol/L (ref 135–145)
Total Bilirubin: 0.8 mg/dL (ref 0.3–1.2)
Total Protein: 4.7 g/dL — ABNORMAL LOW (ref 6.5–8.1)

## 2020-07-21 LAB — CBC
HCT: 26.9 % — ABNORMAL LOW (ref 36.0–46.0)
Hemoglobin: 8.9 g/dL — ABNORMAL LOW (ref 12.0–15.0)
MCH: 28.8 pg (ref 26.0–34.0)
MCHC: 33.1 g/dL (ref 30.0–36.0)
MCV: 87.1 fL (ref 80.0–100.0)
Platelets: 128 10*3/uL — ABNORMAL LOW (ref 150–400)
RBC: 3.09 MIL/uL — ABNORMAL LOW (ref 3.87–5.11)
RDW: 12.1 % (ref 11.5–15.5)
WBC: 7.8 10*3/uL (ref 4.0–10.5)
nRBC: 0 % (ref 0.0–0.2)

## 2020-07-21 LAB — HEMOGLOBIN AND HEMATOCRIT, BLOOD
HCT: 32.5 % — ABNORMAL LOW (ref 36.0–46.0)
Hemoglobin: 10.5 g/dL — ABNORMAL LOW (ref 12.0–15.0)

## 2020-07-21 MED ORDER — POTASSIUM CHLORIDE 2 MEQ/ML IV SOLN
INTRAVENOUS | Status: DC
  Filled 2020-07-21 (×2): qty 1000

## 2020-07-21 NOTE — Progress Notes (Addendum)
Progress Note   Subjective  Complains of a sore throat likely related to intubation and ERCP. Had N/V post procedure and feels much better today.    Objective  Vital signs in last 24 hours: Temp:  [98 F (36.7 C)-98.9 F (37.2 C)] 98.9 F (37.2 C) (06/12 0807) Pulse Rate:  [51-74] 59 (06/12 0807) Resp:  [15-24] 16 (06/12 0807) BP: (98-118)/(38-62) 108/62 (06/12 0807) SpO2:  [95 %-100 %] 95 % (06/12 0807) Weight:  [52.2 kg] 52.2 kg (06/11 1041) Last BM Date: 07/19/20  General: Alert, well-developed, in NAD Heart:  Regular rate and rhythm; no murmurs Chest: Clear to ascultation bilaterally Abdomen:  Soft, nontender and nondistended. Normal bowel sounds, without guarding, and without rebound.   Extremities:  Without edema. Neurologic:  Alert and  oriented x4; grossly normal neurologically. Psych:  Alert and cooperative. Normal mood and affect.  Intake/Output from previous day: 06/11 0701 - 06/12 0700 In: 1041.4 [P.O.:270; I.V.:41.7; IV Piggyback:729.7] Out: 0  Intake/Output this shift: No intake/output data recorded.  Lab Results: Recent Labs    07/20/20 0225 07/20/20 1623 07/21/20 0146  WBC 13.0* 14.5* 7.8  HGB 10.1* 11.7* 8.9*  HCT 30.8* 35.3* 26.9*  PLT 168 209 128*   BMET Recent Labs    07/19/20 1527 07/20/20 0225 07/21/20 0146  NA 133* 136 135  K 4.0 3.8 3.3*  CL 94* 101 102  CO2 20* 23 27  GLUCOSE 152* 116* 107*  BUN 23 14 12   CREATININE 1.81* 1.61* 1.50*  CALCIUM 9.4 8.2* 7.6*   LFT Recent Labs    07/21/20 0146  PROT 4.7*  ALBUMIN 2.1*  AST 72*  ALT 94*  ALKPHOS 184*  BILITOT 0.8   PT/INR Recent Labs    07/19/20 1922 07/20/20 0225  LABPROT 13.9 15.1  INR 1.1 1.2    Studies/Results: CT Abdomen Pelvis Wo Contrast  Result Date: 07/19/2020 CLINICAL DATA:  Syncope.  Epigastric abdominal pain.  Constipation. EXAM: CT ABDOMEN AND PELVIS WITHOUT CONTRAST TECHNIQUE: Multidetector CT imaging of the abdomen and pelvis was performed  following the standard protocol without IV contrast. COMPARISON:  Chest radiograph earlier today. 09/14/2015 abdominal plain film. FINDINGS: Lower chest: Motion degradation. Bibasilar atelectasis. Mild cardiomegaly. Hepatobiliary: No gross liver lesion on this noncontrast CT. The gallbladder is distended, without calcified stone or specific evidence of acute cholecystitis. Marked biliary duct dilatation with the common duct measuring 2.4 cm on 31/6. Followed to the level of a distal common duct stone or stones including up to 1.2 cm on coronal image 30 and transverse image 35. Pancreas: Mild upper abdominal motion degradation. No pancreatic duct dilatation or acute inflammation. Spleen: Normal in size, without focal abnormality. Adrenals/Urinary Tract: Normal adrenal glands. Too small to characterize lesions in both kidneys. No renal calculi or hydronephrosis. No hydroureter or ureteric calculi. No bladder calculi. Stomach/Bowel: Normal stomach, without wall thickening. 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 Otherwise normal small bowel. Normal colon, appendix, and terminal ileum. Vascular/Lymphatic: Aortic atherosclerosis. No abdominopelvic adenopathy. Reproductive: Normal uterus and adnexa. Other: No significant free fluid. Mild pelvic floor laxity. No free intraperitoneal air. Tiny fat containing periumbilical hernia. Musculoskeletal: No acute osseous abnormality. IMPRESSION: 1. Marked biliary duct dilatation secondary to a distal common duct stone or stones, on the order of 1.2 cm. 2. Gallbladder distension, without evidence of acute cholecystitis. 3. Mild motion degradation. 4.  Aortic Atherosclerosis (ICD10-I70.0). Electronically Signed   By: 11/14/2015 M.D.   On: 07/19/2020 18:07   DG Chest Port 1  View  Result Date: 07/19/2020 CLINICAL DATA:  Abdominal pain and chills, initial encounter EXAM: PORTABLE CHEST 1 VIEW COMPARISON:  11/16/2003 FINDINGS: Cardiac shadow is mildly prominent but  stable. Aortic calcifications are again seen. Lungs are clear bilaterally. No acute bony abnormality is noted. Degenerative change of the thoracic spine is seen. IMPRESSION: No acute abnormality noted. Electronically Signed   By: Alcide Clever M.D.   On: 07/19/2020 16:27   DG ERCP BILIARY & PANCREATIC DUCTS  Result Date: 07/20/2020 CLINICAL DATA:  75 year old female with a history of biliary ductal dilatation EXAM: ERCP TECHNIQUE: Multiple spot images obtained with the fluoroscopic device and submitted for interpretation post-procedure. FLUOROSCOPY TIME:  Fluoroscopy Time:  4 minutes 11 seconds COMPARISON:  CT 07/19/2020 FINDINGS: Limited intraoperative fluoroscopic spot images during ERCP. Initial image demonstrates the endoscope projecting over the upper abdomen. Subsequently there is cannulation of the ampulla with retrograde infusion of contrast. Final image demonstrates placement of a plastic biliary stent. The ill-defined filling defects in the ductal system just above the ampulla may represent air bubbles or debris/stones. IMPRESSION: Limited images during ERCP demonstrates findings as above, with placement of plastic biliary stent. Signed, Yvone Neu. Reyne Dumas, RPVI Vascular and Interventional Radiology Specialists [facility] Radiology Electronically Signed   By: Gilmer Mor D.O.   On: 07/20/2020 13:08      Assessment & Recommendations  Choledocholithiasis. S/P ERCP, sphincterotomy, biliary stent placement yesterday. Mild oozing at sphincterotomy treated with epi injection. One CBD stone removed and at least two larger stones remained. LFTs and WBC improved today. Hgb slightly decreased today. No evidence of cholelithiasis on CT. Trend CBC, CMP.  Advance diet to full liquids today. Repeat ERCP for removal of stones and stent electively. Will ask Dr. Meridee Score to assume her care for her repeat ERCP. He will be taking over the hospital service on Monday.     LOS: 2 days   Judie Petit T. Russella Dar MD  07/21/2020, 8:47 AM [phone]

## 2020-07-21 NOTE — Progress Notes (Signed)
PROGRESS NOTE  Becky Turner  PIR:[id number] DOB: 07/30/1945 DOA: 07/19/2020 PCP: Sherren Mocha, MD  Outpatient Specialists: Cardiology, Dr. Jacinto Halim Brief Narrative: Becky Turner is a 75 y.o. female with a history of HTN, HLD, stage IIIa CKD who presented to the ED 6/10 with near syncopal event and several weeks of epigastric pain with nausea. She was hypotensive and afebrile with LFT elevations, Creatinine elevated to 1.81 above baseline ~1.2, hs Troponin 13 and, WBC 7.8k, hgb 13.3g/dl. CT abd/pelvis revealed marked biliary ductal dilatation and distal CBD stones up to 1.2cm. IVF given and patient was taken to ERCP the following morning where some stones were removed and plastic stent placed. The procedure was made difficult due to papilla location within diverticulum. Hospital stay complicated by hypotension and rising leukocytosis for which zosyn was added.  Assessment & Plan: Principal Problem:   Choledocholithiasis Active Problems:   Essential hypertension   Hyperlipidemia, familial, high LDL   Acute kidney injury superimposed on CKD (HCC)   Pain of upper abdomen   Elevated LFTs   Bleeding from sphincterotomy site  Choledocholithiasis with biliary obstruction: s/p partial clearing at ERCP 6/11 with sphincterotomy, plastic CBD stent placement.  - Full liquid diet per GI - Timing of repeat ERCP per GI 6/13. - LFTs improving.   Hypotension: Improving with fluid resuscitation. - Continue zosyn empirically, WBC improved.  - Hgb down though this was after IVF, no overt bleeding. Recheck CBC - WBC up since admission. Check PCT. Low threshold to give zosyn.  - Will give fluid bolus and monitor, continue IV gtt. May consider midodrine to support renal perfusion.   Acute blood loss anemia: Oozing from sphincterotomy site treated with epinephrine.  - Continue monitoring. Check H/H this pm and CBC in AM.  AKI on stage IIIa CKD:  - Holding diuretic, will continue IVF at lower rate while diet being  advanced, support BP.  Thrombocytopenia:  - Monitor.   Hypokalemia:  - Supplement in IVF  Essential HTN: In setting of hypotension, will of course hold home antihypertensives.   s/p right corneal transplant:  - Continue home prednisone gtt  Hyperlipidemia:  - Return to regular praluent infusions as outpatient after discharge.   DVT prophylaxis: SCDs Code Status: Full Family Communication: Daughter at bedside Disposition Plan:  Status is: Inpatient  Remains inpatient appropriate because:Ongoing diagnostic testing needed not appropriate for outpatient work up and IV treatments appropriate due to intensity of illness or inability to take PO  Dispo: The patient is from: Home              Anticipated d/c is to: Home              Patient currently is not medically stable to d/c.   Difficult to place patient No  Consultants:  [facility]  Procedures:  07/20/2020 EGD Dr. 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                           - The common bile duct was dilated, with stones                           causing an obstruction.                           -  Choledocholithiasis was found. Partial removal                           was accomplished with biliary sphincterotomy,                           balloon clearing; a biliary stent was inserted.                           - A biliary sphincterotomy was performed.                           - The biliary tree was swept.                           - One plastic stent was placed into the common bile                           duct. Recommendation: - Avoid aspirin and nonsteroidal anti-inflammatory                           medicines for 1 week.                           - Clear liquid diet today.                           - Return patient to hospital ward for ongoing care.                           - Observe patient's clinical course following                            today's ERCP with therapeutic intervention.                           - Discuss with Dr. Meridee Score regarding repeat ERCP in the near future to remove the remaining stones and biliary stent.  Antimicrobials: Zosyn 6/11 >  Subjective: Had vomiting yesterday but feels less nauseated today. Denies any bleeding including no blood in stool/melena/or hematuria. No chest pain or dyspnea or leg swelling.   Objective: Vitals:   07/20/20 2246 07/21/20 0432 07/21/20 0807 07/21/20 1336  BP: (!) 110/49 (!) 111/52 108/62 (!) 103/52  Pulse: (!) 51 (!) 55 (!) 59 60  Resp:  15 16 20   Temp:  98.4 F (36.9 C) 98.9 F (37.2 C) 97.9 F (36.6 C)  TempSrc:  Oral Oral Oral  SpO2:  98% 95% 96%  Weight:      Height:        Intake/Output Summary (Last 24 hours) at 07/21/2020 1359 Last data filed at 07/21/2020 0700 Gross per 24 hour  Intake 1041.44 ml  Output --  Net 1041.44 ml   Filed Weights   07/19/20 1624 07/20/20 1041  Weight: 52.6 kg 52.2 kg   Gen: 75 y.o. female in no distress Pulm: Nonlabored breathing room air. Clear. CV: Regular rate and rhythm. No murmur, rub, or gallop. No JVD, no dependent edema. GI: Abdomen soft, minimal  RUQ/epigastric tenderness but otherwise non-tender, non-distended, with normoactive bowel sounds.  Ext: Warm, no deformities Skin: No rashes, lesions or ulcers on visualized skin. Neuro: Alert and oriented. No focal neurological deficits. Psych: Judgement and insight appear fair. Mood euthymic & affect congruent. Behavior is appropriate.    Data Reviewed: I have personally reviewed following labs and imaging studies  CBC: Recent Labs  Lab 07/19/20 1527 07/20/20 0225 07/20/20 1623 07/21/20 0146  WBC 7.8 13.0* 14.5* 7.8  NEUTROABS 6.9  --   --   --   HGB 13.3 10.1* 11.7* 8.9*  HCT 41.2 30.8* 35.3* 26.9*  MCV 89.8 88.5 87.4 87.1  PLT 231 168 209 128*   Basic Metabolic Panel: Recent Labs  Lab 07/19/20 1527 07/20/20 0225 07/21/20 0146  NA  133* 136 135  K 4.0 3.8 3.3*  CL 94* 101 102  CO2 20* 23 27  GLUCOSE 152* 116* 107*  BUN CREATININE 1.81* 1.61* 1.50*  CALCIUM 9.4 8.2* 7.6*   GFR: Estimated Creatinine Clearance: 23.3 mL/min (A) (by C-G formula based on SCr of 1.5 mg/dL (H)). Liver Function Tests: Recent Labs  Lab 07/19/20 1527 07/20/20 0225 07/21/20 0146  AST 189* 276* 72*  ALT 122* 183* 94*  ALKPHOS 322* 269* 184*  BILITOT 2.5* 2.2* 0.8  PROT 7.8 5.7* 4.7*  ALBUMIN 3.9 2.8* 2.1*   Recent Labs  Lab 07/19/20 1527  LIPASE 63*   No results for input(s): AMMONIA in the last 168 hours. Coagulation Profile: Recent Labs  Lab 07/19/20 1922 07/20/20 0225  INR 1.1 1.2   Cardiac Enzymes: No results for input(s): CKTOTAL, CKMB, CKMBINDEX, TROPONINI in the last 168 hours. BNP (last 3 results) No results for input(s): PROBNP in the last 8760 hours. HbA1C: No results for input(s): HGBA1C in the last 72 hours. CBG: No results for input(s): GLUCAP in the last 168 hours. Lipid Profile: No results for input(s): CHOL, HDL, LDLCALC, TRIG, CHOLHDL, LDLDIRECT in the last 72 hours. Thyroid Function Tests: No results for input(s): TSH, T4TOTAL, FREET4, T3FREE, THYROIDAB in the last 72 hours. Anemia Panel: No results for input(s): VITAMINB12, FOLATE, FERRITIN, TIBC, IRON, RETICCTPCT in the last 72 hours. Urine analysis:    Component Value Date/Time   COLORURINE YELLOW 07/19/2020 1527   APPEARANCEUR CLEAR 07/19/2020 1527   LABSPEC 1.008 07/19/2020 1527   PHURINE 5.0 07/19/2020 1527   GLUCOSEU NEGATIVE 07/19/2020 1527   HGBUR SMALL (A) 07/19/2020 1527   BILIRUBINUR NEGATIVE 07/19/2020 1527   BILIRUBINUR negative 09/14/2015 0856   KETONESUR 20 (A) 07/19/2020 1527   PROTEINUR NEGATIVE 07/19/2020 1527   UROBILINOGEN 0.2 09/14/2015 0856   NITRITE NEGATIVE 07/19/2020 1527   LEUKOCYTESUR NEGATIVE 07/19/2020 1527   Recent Results (from the past 240 hour(s))  Resp Panel by RT-PCR (Flu A&B, Covid)  Nasopharyngeal Swab     Status: None   Collection Time: 07/19/20  8:20 PM   Specimen: Nasopharyngeal Swab; Nasopharyngeal(NP) swabs in vial transport medium  Result Value Ref Range Status   SARS Coronavirus 2 by RT PCR NEGATIVE NEGATIVE Final    Comment: (NOTE) SARS-CoV-2 target nucleic acids are NOT DETECTED.  The SARS-CoV-2 RNA is generally detectable in upper respiratory specimens during the acute phase of infection. The lowest concentration of SARS-CoV-2 viral copies this assay can detect is 138 copies/mL. A negative result does not preclude SARS-Cov-2 infection and should not be used as the sole basis for treatment or other patient management decisions. A negative result may occur with  improper  specimen collection/handling, submission of specimen other than nasopharyngeal swab, presence of viral mutation(s) within the areas targeted by this assay, and inadequate number of viral copies(<138 copies/mL). A negative result must be combined with clinical observations, patient history, and epidemiological information. The expected result is Negative.  Fact Sheet for Patients:  BloggerCourse.comhttps://www.fda.gov/media/152166/download  Fact Sheet for Healthcare Providers:  SeriousBroker.ithttps://www.fda.gov/media/152162/download  This test is no t yet approved or cleared by the [place] FDA and  has been authorized for detection and/or diagnosis of SARS-CoV-2 by FDA under an Emergency Use Authorization (EUA). This EUA will remain  in effect (meaning this test can be used) for the duration of the COVID-19 declaration under Section 564(b)(1) of the Act, 21 U.S.C.section 360bbb-3(b)(1), unless the authorization is terminated  or revoked sooner.       Influenza A by PCR NEGATIVE NEGATIVE Final   Influenza B by PCR NEGATIVE NEGATIVE Final    Comment: (NOTE) The Xpert Xpress SARS-CoV-2/FLU/RSV plus assay is intended as an aid in the diagnosis of influenza from Nasopharyngeal swab specimens and should not be  used as a sole basis for treatment. Nasal washings and aspirates are unacceptable for Xpert Xpress SARS-CoV-2/FLU/RSV testing.  Fact Sheet for Patients: BloggerCourse.comhttps://www.fda.gov/media/152166/download  Fact Sheet for Healthcare Providers: SeriousBroker.ithttps://www.fda.gov/media/152162/download  This test is not yet approved or cleared by the [place] FDA and has been authorized for detection and/or diagnosis of SARS-CoV-2 by FDA under an Emergency Use Authorization (EUA). This EUA will remain in effect (meaning this test can be used) for the duration of the COVID-19 declaration under Section 564(b)(1) of the Act, 21 U.S.C. section 360bbb-3(b)(1), unless the authorization is terminated or revoked.  Performed at [facility], [address] [place], [place] [address]       Radiology Studies: CT Abdomen Pelvis Wo Contrast  Result Date: 07/19/2020 CLINICAL DATA:  Syncope.  Epigastric abdominal pain.  Constipation. EXAM: CT ABDOMEN AND PELVIS WITHOUT CONTRAST TECHNIQUE: Multidetector CT imaging of the abdomen and pelvis was performed following the standard protocol without IV contrast. COMPARISON:  Chest radiograph earlier today. 09/14/2015 abdominal plain film. FINDINGS: Lower chest: Motion degradation. Bibasilar atelectasis. Mild cardiomegaly. Hepatobiliary: No gross liver lesion on this noncontrast CT. The gallbladder is distended, without calcified stone or specific evidence of acute cholecystitis. Marked biliary duct dilatation with the common duct measuring 2.4 cm on 31/6. Followed to the level of a distal common duct stone or stones including up to 1.2 cm on coronal image 30 and transverse image 35. Pancreas: Mild upper abdominal motion degradation. No pancreatic duct dilatation or acute inflammation. Spleen: Normal in size, without focal abnormality. Adrenals/Urinary Tract: Normal adrenal glands. Too small to characterize lesions in both kidneys. No renal calculi or hydronephrosis. No  hydroureter or ureteric calculi. No bladder calculi. Stomach/Bowel: Normal stomach, without wall thickening. 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 Otherwise normal small bowel. Normal colon, appendix, and terminal ileum. Vascular/Lymphatic: Aortic atherosclerosis. No abdominopelvic adenopathy. Reproductive: Normal uterus and adnexa. Other: No significant free fluid. Mild pelvic floor laxity. No free intraperitoneal air. Tiny fat containing periumbilical hernia. Musculoskeletal: No acute osseous abnormality. IMPRESSION: 1. Marked biliary duct dilatation secondary to a distal common duct stone or stones, on the order of 1.2 cm. 2. Gallbladder distension, without evidence of acute cholecystitis. 3. Mild motion degradation. 4.  Aortic Atherosclerosis (ICD10-I70.0). Electronically Signed   By: Jeronimo GreavesKyle  Talbot M.D.   On: 07/19/2020 18:07   DG Chest Port 1 View  Result Date: 07/19/2020 CLINICAL DATA:  Abdominal pain and  chills, initial encounter EXAM: PORTABLE CHEST 1 VIEW COMPARISON:  11/16/2003 FINDINGS: Cardiac shadow is mildly prominent but stable. Aortic calcifications are again seen. Lungs are clear bilaterally. No acute bony abnormality is noted. Degenerative change of the thoracic spine is seen. IMPRESSION: No acute abnormality noted. Electronically Signed   By: Alcide Clever M.D.   On: 07/19/2020 16:27   DG ERCP BILIARY & PANCREATIC DUCTS  Result Date: 07/20/2020 CLINICAL DATA:  75 year old female with a history of biliary ductal dilatation EXAM: ERCP TECHNIQUE: Multiple spot images obtained with the fluoroscopic device and submitted for interpretation post-procedure. FLUOROSCOPY TIME:  Fluoroscopy Time:  4 minutes 11 seconds COMPARISON:  CT 07/19/2020 FINDINGS: Limited intraoperative fluoroscopic spot images during ERCP. Initial image demonstrates the endoscope projecting over the upper abdomen. Subsequently there is cannulation of the ampulla with retrograde infusion of contrast. Final image  demonstrates placement of a plastic biliary stent. The ill-defined filling defects in the ductal system just above the ampulla may represent air bubbles or debris/stones. IMPRESSION: Limited images during ERCP demonstrates findings as above, with placement of plastic biliary stent. Signed, Yvone Neu. Reyne Dumas, RPVI [facility] Electronically Signed   By: Gilmer Mor D.O.   On: 07/20/2020 13:08    Scheduled Meds:  prednisoLONE acetate  1 drop Right Eye QID   Continuous Infusions:  piperacillin-tazobactam (ZOSYN)  IV 3.375 g (07/21/20 0925)     LOS: 2 days   Time spent: 35 minutes.  Tyrone Nine, MD [facility] [email or web address] 07/21/2020, 1:59 PM

## 2020-07-22 ENCOUNTER — Encounter (HOSPITAL_COMMUNITY): Payer: Self-pay | Admitting: Gastroenterology

## 2020-07-22 DIAGNOSIS — R945 Abnormal results of liver function studies: Secondary | ICD-10-CM

## 2020-07-22 DIAGNOSIS — R1010 Upper abdominal pain, unspecified: Secondary | ICD-10-CM

## 2020-07-22 DIAGNOSIS — Z9889 Other specified postprocedural states: Secondary | ICD-10-CM

## 2020-07-22 LAB — CBC
HCT: 29.3 % — ABNORMAL LOW (ref 36.0–46.0)
Hemoglobin: 9.7 g/dL — ABNORMAL LOW (ref 12.0–15.0)
MCH: 29 pg (ref 26.0–34.0)
MCHC: 33.1 g/dL (ref 30.0–36.0)
MCV: 87.7 fL (ref 80.0–100.0)
Platelets: 155 10*3/uL (ref 150–400)
RBC: 3.34 MIL/uL — ABNORMAL LOW (ref 3.87–5.11)
RDW: 12.3 % (ref 11.5–15.5)
WBC: 7.2 10*3/uL (ref 4.0–10.5)
nRBC: 0 % (ref 0.0–0.2)

## 2020-07-22 LAB — COMPREHENSIVE METABOLIC PANEL
ALT: 63 U/L — ABNORMAL HIGH (ref 0–44)
AST: 30 U/L (ref 15–41)
Albumin: 2.2 g/dL — ABNORMAL LOW (ref 3.5–5.0)
Alkaline Phosphatase: 147 U/L — ABNORMAL HIGH (ref 38–126)
Anion gap: 5 (ref 5–15)
BUN: 9 mg/dL (ref 8–23)
CO2: 28 mmol/L (ref 22–32)
Calcium: 8 mg/dL — ABNORMAL LOW (ref 8.9–10.3)
Chloride: 107 mmol/L (ref 98–111)
Creatinine, Ser: 1.49 mg/dL — ABNORMAL HIGH (ref 0.44–1.00)
GFR, Estimated: 36 mL/min — ABNORMAL LOW (ref >60)
Glucose, Bld: 103 mg/dL — ABNORMAL HIGH (ref 70–99)
Potassium: 3.9 mmol/L (ref 3.5–5.1)
Sodium: 140 mmol/L (ref 135–145)
Total Bilirubin: 0.8 mg/dL (ref 0.3–1.2)
Total Protein: 4.9 g/dL — ABNORMAL LOW (ref 6.5–8.1)

## 2020-07-22 LAB — PROCALCITONIN: Procalcitonin: 5.81 ng/mL

## 2020-07-22 NOTE — Progress Notes (Signed)
Patient alert and oriented x4, understands simple english. Daughter at bedside, able to help with translation. IV zosyn discontinued. Daughter bringing food from home for cultural reasons, educated on notifying RN or NT regarding what/how much patient is eating. Patient to go to ERCP tomorrow 6/14, and potential gallbladder removal 6/15. Daughter aware. Soft diet as of now, will be NPO at midnight for procedure.

## 2020-07-22 NOTE — Progress Notes (Signed)
Probiotic home med brought into hospital, sheet filled out, pills counted, and walked down to pharmacy to be entered into patient's med list. Patient and daughter updated.

## 2020-07-22 NOTE — Consult Note (Signed)
[facility] Surgery Consult Note  Becky Turner 05/26/1945  [id number].    Requesting MD: Erasmo Leventhal MD Chief Complaint/Reason for Consult: choledocholithiasis  HPI:  Mr. Becky Turner is a 75 y/o F with a PMH HTN, HLD, CKD IIIa who presented to ED 6/10 with a cc upper abdominal pain. Daughter at bedside who assisted with translating. States that she has had intermittent abdominal pain for 1-2 months. The pain became acute worse the morning of 6/10 and was associated with nausea. No emesis, fever, chills, diarrhea. Pain is mostly epigastric and radiates up into her chest. Workup revealed choledocholithiasis with CBD stone visible on CT A/P and total bilirubin on 2.5; no signs of cholecystitis. Patient was admitted to the medical service with gastroenterology consult. She underwent ERCP 6/11 where common bile duct was dilated and choledocholithiasis found; partial  removal was accomplished with biliary sphincterotomy, balloon clearing; one plastic stent was placed into the common bile duct. She is scheduled for repeat ERCP tomorrow. General surgery asked to see for consideration of cholecystectomy.  States that she no longer has abdominal pain. Denies n/v. Tolerating diet. WBC and total bilirubin have normalized.   Abdominal surgical history: none Anticoagulants: none Nonsmoker Denies alcohol or illicit drug use Lives at home with daughter Medication Allergy: aspirin  ROS: Review of Systems  Constitutional: Negative.   Respiratory: Negative.    Cardiovascular: Negative.   Gastrointestinal:  Positive for abdominal pain and nausea. Negative for vomiting.   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 No pertinent surgical history.  Social History:  reports that she has never smoked. She  has never used smokeless tobacco. She reports that she does not drink alcohol and does not use drugs.  Allergies:  Allergies  Allergen Reactions   Aspirin Nausea And Vomiting    Medications Prior to Admission  Medication Sig Dispense Refill   amLODipine (NORVASC) 5 MG tablet TAKE 1 TABLET(5 MG) BY MOUTH DAILY (Patient taking differently: Take 5 mg by mouth daily.) 90 tablet 2   lisinopril-hydrochlorothiazide (ZESTORETIC) 20-25 MG tablet TAKE 1 TABLET BY MOUTH DAILY (Patient taking differently: Take 1 tablet by mouth daily.) 90 tablet 2   metoprolol succinate (TOPROL-XL) 50 MG 24 hr tablet TAKE 1 TABLET BY MOUTH DAILY (Patient taking differently: Take 50 mg by mouth daily.) 90 tablet 1   omeprazole (PRILOSEC) 20 MG capsule Take 1 capsule (20 mg total) by mouth daily. 30 capsule 0   prednisoLONE acetate (PRED FORTE) 1 % ophthalmic suspension Place 1 drop into the right eye 4 (four) times daily.     rosuvastatin (CRESTOR) 10 MG tablet TAKE 1 TABLET BY MOUTH EVERY NIGHT (Patient taking differently: Take 10 mg by mouth every evening.) 90 tablet 1   PRALUENT 150 MG/ML SOAJ Inject 150 mLs as directed every 14 (fourteen) days. 6 mL 1    Blood pressure (!) 122/54, pulse 72, temperature 98.2 F (36.8 C), temperature source Oral, resp. rate 17, height 5' (1.524 m), weight 52.2 kg, SpO2 93 %. Physical Exam: General: pleasant, WD/WN female who is laying in bed in NAD HEENT: head is normocephalic, atraumatic.  Sclera are noninjected.  Pupils equal and round.  Ears and nose without any masses or lesions.  Mouth is pink and moist. Dentition  fair Heart: regular, rate, and rhythm.  Normal s1,s2. No obvious murmurs, gallops, or rubs noted.  Palpable pedal pulses bilaterally  Lungs: CTAB, no wheezes, rhonchi, or rales noted.  Respiratory effort nonlabored Abd: soft, NT/ND, +BS, no masses, hernias, or organomegaly MS: no BUE/BLE edema, calves soft and nontender Skin: warm and dry with no masses, lesions, or  rashes Psych: A&Ox4 with an appropriate affect Neuro: cranial nerves grossly intact, equal strength in BUE/BLE bilaterally, normal speech, thought process intact  Results for orders placed or performed during the hospital encounter of 07/19/20 (from the past 48 hour(s))  CBC     Status: Abnormal   Collection Time: 07/20/20  4:23 PM  Result Value Ref Range   WBC 14.5 (H) 4.0 - 10.5 K/uL   RBC 4.04 3.87 - 5.11 MIL/uL   Hemoglobin 11.7 (L) 12.0 - 15.0 g/dL   HCT 19.135.3 (L) 47.836.0 - 29.546.0 %   MCV 87.4 80.0 - 100.0 fL   MCH 29.0 26.0 - 34.0 pg   MCHC 33.1 30.0 - 36.0 g/dL   RDW 62.112.1 30.811.5 - 65.715.5 %   Platelets 209 150 - 400 K/uL   nRBC 0.0 0.0 - 0.2 %    Comment: Performed at [facility], [address] [place], [place] [id number]  Procalcitonin - Baseline     Status: None   Collection Time: 07/20/20  4:23 PM  Result Value Ref Range   Procalcitonin 15.25 ng/mL    Comment:        Interpretation: PCT >= 10 ng/mL: Important systemic inflammatory response, almost exclusively due to severe bacterial sepsis or septic shock. (NOTE)       Sepsis PCT Algorithm           Lower Respiratory Tract                                      Infection PCT Algorithm    ----------------------------     ----------------------------         PCT < 0.25 ng/mL                PCT < 0.10 ng/mL          Strongly encourage             Strongly discourage   discontinuation of antibiotics    initiation of antibiotics    ----------------------------     -----------------------------       PCT 0.25 - 0.50 ng/mL            PCT 0.10 - 0.25 ng/mL               OR       >80% decrease in PCT            Discourage initiation of                                            antibiotics      Encourage discontinuation           of antibiotics    ----------------------------     -----------------------------         PCT >= 0.50 ng/mL              PCT 0.26 - 0.50 ng/mL  AND       <80% decrease in PCT              Encourage initiation of                                             antibiotics       Encourage continuation           of antibiotics    ----------------------------     -----------------------------        PCT >= 0.50 ng/mL                  PCT > 0.50 ng/mL               AND         increase in PCT                  Strongly encourage                                      initiation of antibiotics    Strongly encourage escalation           of antibiotics                                     -----------------------------                                           PCT <= 0.25 ng/mL                                                 OR                                        > 80% decrease in PCT                                      Discontinue / Do not initiate                                             antibiotics  Performed at [facility], [address], [place], [place] [address]   CBC     Status: Abnormal   Collection Time: 07/21/20  1:46 AM  Result Value Ref Range   WBC 7.8 4.0 - 10.5 K/uL   RBC 3.09 (L) 3.87 - 5.11 MIL/uL   Hemoglobin 8.9 (L) 12.0 - 15.0 g/dL   HCT 47.0 (L) 96.2 - 83.6 %   MCV 87.1 80.0 - 100.0 fL   MCH 28.8 26.0 - 34.0 pg   MCHC 33.1 30.0 - 36.0 g/dL   RDW 62.9 47.6 -  15.5 %   Platelets 128 (L) 150 - 400 K/uL   nRBC 0.0 0.0 - 0.2 %    Comment: Performed at [facility], [address], [place], [place] [address]  Comprehensive metabolic panel     Status: Abnormal   Collection Time: 07/21/20  1:46 AM  Result Value Ref Range   Sodium 135 135 - 145 mmol/L   Potassium 3.3 (L) 3.5 - 5.1 mmol/L   Chloride 102 98 - 111 mmol/L   CO2 27 22 - 32 mmol/L   Glucose, Bld 107 (H) 70 - 99 mg/dL    Comment: Glucose reference range applies only to samples taken after fasting for at least 8 hours.   BUN 12 8 - 23 mg/dL   Creatinine, Ser 6.04 (H) 0.44 - 1.00 mg/dL   Calcium 7.6 (L) 8.9 - 10.3 mg/dL   Total Protein 4.7 (L) 6.5 - 8.1 g/dL   Albumin 2.1 (L)  3.5 - 5.0 g/dL   AST 72 (H) 15 - 41 U/L   ALT 94 (H) 0 - 44 U/L   Alkaline Phosphatase 184 (H) 38 - 126 U/L   Total Bilirubin 0.8 0.3 - 1.2 mg/dL   GFR, Estimated 36 (L) >60 mL/min    Comment: (NOTE) Calculated using the CKD-EPI Creatinine Equation (2021)    Anion gap 6 5 - 15    Comment: Performed at [facility], [address] [place], [place] [address]  Hemoglobin and hematocrit, blood     Status: Abnormal   Collection Time: 07/21/20  3:10 PM  Result Value Ref Range   Hemoglobin 10.5 (L) 12.0 - 15.0 g/dL   HCT 11.9 (L) 14.7 - 82.9 %    Comment: Performed at [facility], [address], [place], [place] [address]  CBC     Status: Abnormal   Collection Time: 07/22/20  3:19 AM  Result Value Ref Range   WBC 7.2 4.0 - 10.5 K/uL   RBC 3.34 (L) 3.87 - 5.11 MIL/uL   Hemoglobin 9.7 (L) 12.0 - 15.0 g/dL   HCT 08.6 (L) 57.8 - 46.9 %   MCV 87.7 80.0 - 100.0 fL   MCH 29.0 26.0 - 34.0 pg   MCHC 33.1 30.0 - 36.0 g/dL   RDW 62.9 52.8 - 41.3 %   Platelets 155 150 - 400 K/uL   nRBC 0.0 0.0 - 0.2 %    Comment: Performed at [facility], [address] [place], [place] [address]  Comprehensive metabolic panel     Status: Abnormal   Collection Time: 07/22/20  3:19 AM  Result Value Ref Range   Sodium 140 135 - 145 mmol/L   Potassium 3.9 3.5 - 5.1 mmol/L   Chloride 107 98 - 111 mmol/L   CO2 28 22 - 32 mmol/L   Glucose, Bld 103 (H) 70 - 99 mg/dL    Comment: Glucose reference range applies only to samples taken after fasting for at least 8 hours.   BUN 9 8 - 23 mg/dL   Creatinine, Ser 0.27 (H) 0.44 - 1.00 mg/dL   Calcium 8.0 (L) 8.9 - 10.3 mg/dL   Total Protein 4.9 (L) 6.5 - 8.1 g/dL   Albumin 2.2 (L) 3.5 - 5.0 g/dL   AST 30 15 - 41 U/L   ALT 63 (H) 0 - 44 U/L   Alkaline Phosphatase 147 (H) 38 - 126 U/L   Total Bilirubin 0.8 0.3 - 1.2 mg/dL   GFR,  Estimated 36 (L) >60 mL/min    Comment: (NOTE) Calculated using the CKD-EPI Creatinine Equation (2021)    Anion gap 5  5 - 15    Comment: Performed at [facility], [address] [place], [place] [address]  Procalcitonin     Status: None   Collection Time: 07/22/20  3:19 AM  Result Value Ref Range   Procalcitonin 5.81 ng/mL    Comment:        Interpretation: PCT > 2 ng/mL: Systemic infection (sepsis) is likely, unless other causes are known. (NOTE)       Sepsis PCT Algorithm           Lower Respiratory Tract                                      Infection PCT Algorithm    ----------------------------     ----------------------------         PCT < 0.25 ng/mL                PCT < 0.10 ng/mL          Strongly encourage             Strongly discourage   discontinuation of antibiotics    initiation of antibiotics    ----------------------------     -----------------------------       PCT 0.25 - 0.50 ng/mL            PCT 0.10 - 0.25 ng/mL               OR       >80% decrease in PCT            Discourage initiation of                                            antibiotics      Encourage discontinuation           of antibiotics    ----------------------------     -----------------------------         PCT >= 0.50 ng/mL              PCT 0.26 - 0.50 ng/mL               AND       <80% decrease in PCT              Encourage initiation of                                             antibiotics       Encourage continuation           of antibiotics    ----------------------------     -----------------------------        PCT >= 0.50 ng/mL                  PCT > 0.50 ng/mL               AND         increase in PCT  Strongly encourage                                      initiation of antibiotics    Strongly encourage escalation           of antibiotics                                     -----------------------------                                           PCT <= 0.25 ng/mL                                                 OR                                        > 80% decrease in  PCT                                      Discontinue / Do not initiate                                             antibiotics  Performed at [facility], [address], [place], [place] [address]    DG ERCP BILIARY & PANCREATIC DUCTS  Result Date: 07/20/2020 CLINICAL DATA:  75 year old female with a history of biliary ductal dilatation EXAM: ERCP TECHNIQUE: Multiple spot images obtained with the fluoroscopic device and submitted for interpretation post-procedure. FLUOROSCOPY TIME:  Fluoroscopy Time:  4 minutes 11 seconds COMPARISON:  CT 07/19/2020 FINDINGS: Limited intraoperative fluoroscopic spot images during ERCP. Initial image demonstrates the endoscope projecting over the upper abdomen. Subsequently there is cannulation of the ampulla with retrograde infusion of contrast. Final image demonstrates placement of a plastic biliary stent. The ill-defined filling defects in the ductal system just above the ampulla may represent air bubbles or debris/stones. IMPRESSION: Limited images during ERCP demonstrates findings as above, with placement of plastic biliary stent. Signed, Yvone Neu. Reyne Dumas, RPVI [facility] Electronically Signed   By: Gilmer Mor D.O.   On: 07/20/2020 13:08    Anti-infectives (From admission, onward)    Start     Dose/Rate Route Frequency Ordered Stop   07/20/20 1815  piperacillin-tazobactam (ZOSYN) IVPB 3.375 g  Status:  Discontinued        3.375 g 12.5 mL/hr over 240 Minutes Intravenous Every 8 hours 07/20/20 1806 07/22/20 0951        Assessment/Plan Choledocholithiasis  - s/p ERCP 6/11 where common bile duct was dilated and choledocholithiasis found; partial removal was accomplished with biliary sphincterotomy, balloon clearing; one plastic stent was placed into the common bile duct - Scheduled for repeat ERCP tomorrow - Tentatively plan for  laparoscopic cholecystectomy Wednesday 6/15 pending  results of ERCP tomorrow. Risks and benefits of surgery were discussed with the patient and her daughter and they wish to proceed.   ID - zosyn 6/11>>6/13 FEN - soft diet, NPO after MN VTE - SCDs, ok for chemical DVT prophylaxis from surgical standpoint Foley - none  HTN HLD CKD-IIIa S/p right corneal transplant Anemia  Franne Forts, PA-C [facility] Please see Amion for pager number during day hours 7:00am-4:30pm 07/22/2020, 11:43 AM

## 2020-07-22 NOTE — Progress Notes (Signed)
Patient has been disconnected from IV pump. Orders d/c'd IV abx and potassium fluids. Ambulated twice around unit with daughter next to her.

## 2020-07-22 NOTE — Care Management Important Message (Signed)
Important Message  Patient Details  Name: Becky Turner MRN: [id number] Date of Birth: 19-Jun-1945   Medicare Important Message Given:  Yes     Fiana Gladu Stefan Church 07/22/2020, 3:51 PM

## 2020-07-22 NOTE — H&P (View-Only) (Signed)
Daily Rounding Note  07/22/2020, 8:27 AM  LOS: 3 days   SUBJECTIVE:   Chief complaint:    choledocholithiasis  Pain is better, 5/10 c/w 10/10 at admit.  No nausea.  Stools loose, watery. Wanting to eat food from home but appetite depressed  OBJECTIVE:         Vital signs in last 24 hours:    Temp:  [97.9 F (36.6 C)-98.6 F (37 C)] 98.2 F (36.8 C) (06/13 0419) Pulse Rate:  [60-72] 72 (06/13 0419) Resp:  [17-20] 17 (06/13 0419) BP: (103-122)/(45-54) 122/54 (06/13 0419) SpO2:  [93 %-98 %] 93 % (06/13 0419) Last BM Date: 07/21/20 Filed Weights   07/19/20 1624 07/20/20 1041  Weight: 52.6 kg 52.2 kg   General: looks well.   Heart: RRR Chest: Clear bilaterally without labored breathing.  No cough Abdomen: Soft.  Mild to moderate right upper quadrant tenderness without guarding or rebound.  Active bowel sounds.  No distention Extremities: No CCE. Neuro/Psych: Oriented x3.  Alert.  Moves all 4 limbs.  No tremor or gross weakness/deficit.  Intake/Output from previous day: 06/12 0701 - 06/13 0700 In: 1036.4 [I.V.:889.8; IV Piggyback:146.6] Out: -   Intake/Output this shift: Total I/O In: 50 [P.O.:50] Out: -   Lab Results: Recent Labs    07/20/20 1623 07/21/20 0146 07/21/20 1510 07/22/20 0319  WBC 14.5* 7.8  --  7.2  HGB 11.7* 8.9* 10.5* 9.7*  HCT 35.3* 26.9* 32.5* 29.3*  PLT 209 128*  --  155   BMET Recent Labs    07/20/20 0225 07/21/20 0146 07/22/20 0319  NA 136 135 140  K 3.8 3.3* 3.9  CL 101 102 107  CO2 23 27 28   GLUCOSE 116* 107* 103*  BUN 14 12 9   CREATININE 1.61* 1.50* 1.49*  CALCIUM 8.2* 7.6* 8.0*   LFT Recent Labs    07/20/20 0225 07/21/20 0146 07/22/20 0319  PROT 5.7* 4.7* 4.9*  ALBUMIN 2.8* 2.1* 2.2*  AST 276* 72* 30  ALT 183* 94* 63*  ALKPHOS 269* 184* 147*  BILITOT 2.2* 0.8 0.8   PT/INR Recent Labs    07/19/20 1922 07/20/20 0225  LABPROT 13.9 15.1  INR 1.1 1.2    Hepatitis Panel No results for input(s): HEPBSAG, HCVAB, HEPAIGM, HEPBIGM in the last 72 hours.  Studies/Results: DG ERCP BILIARY & PANCREATIC DUCTS  Result Date: 07/20/2020 CLINICAL DATA:  75 year old female with a history of biliary ductal dilatation EXAM: ERCP TECHNIQUE: Multiple spot images obtained with the fluoroscopic device and submitted for interpretation post-procedure. FLUOROSCOPY TIME:  Fluoroscopy Time:  4 minutes 11 seconds COMPARISON:  CT 07/19/2020 FINDINGS: Limited intraoperative fluoroscopic spot images during ERCP. Initial image demonstrates the endoscope projecting over the upper abdomen. Subsequently there is cannulation of the ampulla with retrograde infusion of contrast. Final image demonstrates placement of a plastic biliary stent. The ill-defined filling defects in the ductal system just above the ampulla may represent air bubbles or debris/stones. IMPRESSION: Limited images during ERCP demonstrates findings as above, with placement of plastic biliary stent. Signed, 61. 09/18/2020, RPVI [facility] Electronically Signed   By: Reyne Dumas D.O.   On: 07/20/2020 13:08    Scheduled Meds:  prednisoLONE acetate  1 drop Right Eye QID   Continuous Infusions:  lactated ringers with kcl 50 mL/hr at 07/21/20 1521   piperacillin-tazobactam (ZOSYN)  IV 3.375 g (07/22/20 0107)   PRN Meds:.morphine injection, ondansetron (ZOFRAN)  IV   ASSESMENT:      Choledocholithiasis.      6/12 ERCP w sphinct.  Ampulla win duodenal tic.  Single stone extracted, 2 larger stones refractory to balloon sweep remain, plastic stent placed.   Oozing blood at sphincterotomy site treated/subsided w epi. Day 3 Zosyn.   LFTs improving.  Leukocytosis resolved.      [place] anemia.  Hgb 13.3 >> 8.9 >>  9.7.  No previous colonoscopy or EGD.  FOBT status unknown.    AKI.     PLAN      Repeat ERCP w Dr Meridee Score, tmrw time TBD.   Marland Kitchen  Patient needs PCP referral as she does not have one.  Consider colonoscopy in a few months after full recovery.  General surgery consult for eventual cholecystectomy.       Given diarrhea, which could be side effect of antibiotic, do we need to continue the Zosyn?    Jennye Moccasin  07/22/2020, 8:27 AM Phone [phone]

## 2020-07-22 NOTE — Progress Notes (Signed)
PROGRESS NOTE  Becky Turner  WJX:[id number] DOB: [birth year] DOA: 07/19/2020 PCP: Sherren Mocha, MD  Outpatient Specialists: Cardiology, Dr. Jacinto Halim Brief Narrative: Becky Turner is a 75 y.o. female with a history of HTN, HLD, stage IIIa CKD who presented to the ED 6/10 with near syncopal event and several weeks of epigastric pain with nausea. She was hypotensive and afebrile with LFT elevations, Creatinine elevated to 1.81 above baseline ~1.2, hs Troponin 13 and, WBC 7.8k, hgb 13.3g/dl. CT abd/pelvis revealed marked biliary ductal dilatation and distal CBD stones up to 1.2cm. IVF given and patient was taken to ERCP the following morning where some stones were removed and plastic stent placed. The procedure was made difficult due to papilla location within diverticulum. Hospital stay complicated by hypotension and rising leukocytosis for which zosyn was added. She has remained afebrile with improved blood pressure, and may be developing antibiotic-associated diarrhea, so antibiotics are stopped. Repeat ERCP is planned 07/23/2020.   Assessment & Plan: Principal Problem:   Choledocholithiasis Active Problems:   Essential hypertension   Hyperlipidemia, familial, high LDL   Acute kidney injury superimposed on CKD (HCC)   Pain of upper abdomen   Elevated LFTs   Bleeding from sphincterotomy site  Choledocholithiasis with biliary obstruction: s/p partial clearing at ERCP 6/11 with sphincterotomy, plastic CBD stent placement.  - Soft diet today per GI, NPO p MN for repeat ERCP 6/14.  - Trend CMP, LFTs improving, still slightly elevated.   Hypotension: Improved. Will hold zosyn as pt has remained afebrile with resolution of leukocytosis.  - Continue zosyn empirically, WBC improved.  - Hold IVF now that BP better and diet is advanced to soft.  Acute blood loss anemia: Oozing from sphincterotomy site treated with epinephrine.  - Hgb stabilized. No bleeding noted.   AKI on stage IIIa CKD:  - Holding diuretic,  will continue IVF at lower rate while diet being advanced, support BP.  Thrombocytopenia: Resolved.  Hypokalemia: Resolved with supplementation.  Essential HTN: Holding antihypertensives.  s/p right corneal transplant:  - Continue home prednisone gtt  Hyperlipidemia:  - Return to regular praluent infusions as outpatient after discharge.   DVT prophylaxis: SCDs Code Status: Full Family Communication: Daughter at bedside Disposition Plan:  Status is: Inpatient  Remains inpatient appropriate because:Ongoing diagnostic testing needed not appropriate for outpatient work up and IV treatments appropriate due to intensity of illness or inability to take PO  Dispo: The patient is from: Home              Anticipated d/c is to: Home              Patient currently is not medically stable to d/c.   Difficult to place patient No  Consultants:  [facility]  Procedures:  07/23/2020 ERCP planned Dr. Meridee Score  07/20/2020 ERCP Dr. 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                           - The common bile duct was dilated, with stones                           causing an obstruction.                           -  Choledocholithiasis was found. Partial removal                           was accomplished with biliary sphincterotomy,                           balloon clearing; a biliary stent was inserted.                           - A biliary sphincterotomy was performed.                           - The biliary tree was swept.                           - One plastic stent was placed into the common bile                           duct. Recommendation: - Avoid aspirin and nonsteroidal anti-inflammatory                           medicines for 1 week.                           - Clear liquid diet today.                           - Return patient to hospital ward for ongoing care.                           -  Observe patient's clinical course following                           today's ERCP with therapeutic intervention.                           - Discuss with Dr. Meridee Score regarding repeat ERCP in the near future to remove the remaining stones and biliary stent.  Antimicrobials: Zosyn 6/11 - 6/13.  Subjective: Still moderate RUQ tenderness without other pain, mild nausea, but taking liquids. No lightheadedness, palpitations, or leg swelling.   Objective: Vitals:   07/21/20 1336 07/21/20 2006 07/22/20 0419 07/22/20 1303  BP: (!) 103/52 (!) 116/45 (!) 122/54 129/60  Pulse: 60 60 72 71  Resp: 20 18 17 18   Temp: 97.9 F (36.6 C) 98.6 F (37 C) 98.2 F (36.8 C) 98.1 F (36.7 C)  TempSrc: Oral Oral Oral Oral  SpO2: 96% 98% 93% 96%  Weight:      Height:        Intake/Output Summary (Last 24 hours) at 07/22/2020 1331 Last data filed at 07/22/2020 1000 Gross per 24 hour  Intake 1186.42 ml  Output --  Net 1186.42 ml   Filed Weights   07/19/20 1624 07/20/20 1041  Weight: 52.6 kg 52.2 kg   Gen: 75 y.o. female in no distress Pulm: Nonlabored breathing room air. Clear. CV: Regular rate and rhythm. No murmur, rub, or gallop. No JVD, no dependent edema. GI: Abdomen soft, mild RUQ tenderness without rebound or guarding,  otherwise non-tender, non-distended, with normoactive bowel sounds.  Ext: Warm, no deformities Skin: No rashes, lesions or ulcers on visualized skin. Neuro: Alert and oriented. No focal neurological deficits. Psych: Judgement and insight appear fair. Mood euthymic & affect congruent. Behavior is appropriate.    Data Reviewed: I have personally reviewed following labs and imaging studies  CBC: Recent Labs  Lab 07/19/20 1527 07/20/20 0225 07/20/20 1623 07/21/20 0146 07/21/20 1510 07/22/20 0319  WBC 7.8 13.0* 14.5* 7.8  --  7.2  NEUTROABS 6.9  --   --   --   --   --   HGB 13.3 10.1* 11.7* 8.9* 10.5* 9.7*  HCT 41.2 30.8* 35.3* 26.9* 32.5* 29.3*  MCV 89.8 88.5 87.4  87.1  --  87.7  PLT 231 168 209 128*  --  155   Basic Metabolic Panel: Recent Labs  Lab 07/19/20 1527 07/20/20 0225 07/21/20 0146 07/22/20 0319  NA 133* 136 135 140  K 4.0 3.8 3.3* 3.9  CL 94* 101 102 107  CO2 20* 23 27 28   GLUCOSE 152* 116* 107* 103*  BUN 23 14 12 9   CREATININE 1.81* 1.61* 1.50* 1.49*  CALCIUM 9.4 8.2* 7.6* 8.0*   GFR: Estimated Creatinine Clearance: 23.4 mL/min (A) (by C-G formula based on SCr of 1.49 mg/dL (H)). Liver Function Tests: Recent Labs  Lab 07/19/20 1527 07/20/20 0225 07/21/20 0146 07/22/20 0319  AST 189* 276* 72* 30  ALT 122* 183* 94* 63*  ALKPHOS 322* 269* 184* 147*  BILITOT 2.5* 2.2* 0.8 0.8  PROT 7.8 5.7* 4.7* 4.9*  ALBUMIN 3.9 2.8* 2.1* 2.2*   Recent Labs  Lab 07/19/20 1527  LIPASE 63*   No results for input(s): AMMONIA in the last 168 hours. Coagulation Profile: Recent Labs  Lab 07/19/20 1922 07/20/20 0225  INR 1.1 1.2   Cardiac Enzymes: No results for input(s): CKTOTAL, CKMB, CKMBINDEX, TROPONINI in the last 168 hours. BNP (last 3 results) No results for input(s): PROBNP in the last 8760 hours. HbA1C: No results for input(s): HGBA1C in the last 72 hours. CBG: No results for input(s): GLUCAP in the last 168 hours. Lipid Profile: No results for input(s): CHOL, HDL, LDLCALC, TRIG, CHOLHDL, LDLDIRECT in the last 72 hours. Thyroid Function Tests: No results for input(s): TSH, T4TOTAL, FREET4, T3FREE, THYROIDAB in the last 72 hours. Anemia Panel: No results for input(s): VITAMINB12, FOLATE, FERRITIN, TIBC, IRON, RETICCTPCT in the last 72 hours. Urine analysis:    Component Value Date/Time   COLORURINE YELLOW 07/19/2020 1527   APPEARANCEUR CLEAR 07/19/2020 1527   LABSPEC 1.008 07/19/2020 1527   PHURINE 5.0 07/19/2020 1527   GLUCOSEU NEGATIVE 07/19/2020 1527   HGBUR SMALL (A) 07/19/2020 1527   BILIRUBINUR NEGATIVE 07/19/2020 1527   BILIRUBINUR negative 09/14/2015 0856   KETONESUR 20 (A) 07/19/2020 1527   PROTEINUR  NEGATIVE 07/19/2020 1527   UROBILINOGEN 0.2 09/14/2015 0856   NITRITE NEGATIVE 07/19/2020 1527   LEUKOCYTESUR NEGATIVE 07/19/2020 1527   Recent Results (from the past 240 hour(s))  Resp Panel by RT-PCR (Flu A&B, Covid) Nasopharyngeal Swab     Status: None   Collection Time: 07/19/20  8:20 PM   Specimen: Nasopharyngeal Swab; Nasopharyngeal(NP) swabs in vial transport medium  Result Value Ref Range Status   SARS Coronavirus 2 by RT PCR NEGATIVE NEGATIVE Final    Comment: (NOTE) SARS-CoV-2 target nucleic acids are NOT DETECTED.  The SARS-CoV-2 RNA is generally detectable in upper respiratory specimens during the acute phase of infection. The lowest concentration of SARS-CoV-2  viral copies this assay can detect is 138 copies/mL. A negative result does not preclude SARS-Cov-2 infection and should not be used as the sole basis for treatment or other patient management decisions. A negative result may occur with  improper specimen collection/handling, submission of specimen other than nasopharyngeal swab, presence of viral mutation(s) within the areas targeted by this assay, and inadequate number of viral copies(<138 copies/mL). A negative result must be combined with clinical observations, patient history, and epidemiological information. The expected result is Negative.  Fact Sheet for Patients:  BloggerCourse.comhttps://www.fda.gov/media/152166/download  Fact Sheet for Healthcare Providers:  SeriousBroker.ithttps://www.fda.gov/media/152162/download  This test is no t yet approved or cleared by the [place] FDA and  has been authorized for detection and/or diagnosis of SARS-CoV-2 by FDA under an Emergency Use Authorization (EUA). This EUA will remain  in effect (meaning this test can be used) for the duration of the COVID-19 declaration under Section 564(b)(1) of the Act, 21 U.S.C.section 360bbb-3(b)(1), unless the authorization is terminated  or revoked sooner.       Influenza A by PCR NEGATIVE NEGATIVE  Final   Influenza B by PCR NEGATIVE NEGATIVE Final    Comment: (NOTE) The Xpert Xpress SARS-CoV-2/FLU/RSV plus assay is intended as an aid in the diagnosis of influenza from Nasopharyngeal swab specimens and should not be used as a sole basis for treatment. Nasal washings and aspirates are unacceptable for Xpert Xpress SARS-CoV-2/FLU/RSV testing.  Fact Sheet for Patients: BloggerCourse.comhttps://www.fda.gov/media/152166/download  Fact Sheet for Healthcare Providers: SeriousBroker.ithttps://www.fda.gov/media/152162/download  This test is not yet approved or cleared by the [place] FDA and has been authorized for detection and/or diagnosis of SARS-CoV-2 by FDA under an Emergency Use Authorization (EUA). This EUA will remain in effect (meaning this test can be used) for the duration of the COVID-19 declaration under Section 564(b)(1) of the Act, 21 U.S.C. section 360bbb-3(b)(1), unless the authorization is terminated or revoked.  Performed at [facility], [address] [place], [place] [address]       Radiology Studies: No results found.  Scheduled Meds:  prednisoLONE acetate  1 drop Right Eye QID   Continuous Infusions:  lactated ringers with kcl 50 mL/hr at 07/21/20 1521     LOS: 3 days   Time spent: 25 minutes.  Tyrone Nineyan B Darral Rishel, MD [facility] [email or web address] 07/22/2020, 1:31 PM

## 2020-07-22 NOTE — Progress Notes (Addendum)
Daily Rounding Note  07/22/2020, 8:27 AM  LOS: 3 days   SUBJECTIVE:   Chief complaint:    choledocholithiasis  Pain is better, 5/10 c/w 10/10 at admit.  No nausea.  Stools loose, watery. Wanting to eat food from home but appetite depressed  OBJECTIVE:         Vital signs in last 24 hours:    Temp:  [97.9 F (36.6 C)-98.6 F (37 C)] 98.2 F (36.8 C) (06/13 0419) Pulse Rate:  [60-72] 72 (06/13 0419) Resp:  [17-20] 17 (06/13 0419) BP: (103-122)/(45-54) 122/54 (06/13 0419) SpO2:  [93 %-98 %] 93 % (06/13 0419) Last BM Date: 07/21/20 Filed Weights   07/19/20 1624 07/20/20 1041  Weight: 52.6 kg 52.2 kg   General: looks well.   Heart: RRR Chest: Clear bilaterally without labored breathing.  No cough Abdomen: Soft.  Mild to moderate right upper quadrant tenderness without guarding or rebound.  Active bowel sounds.  No distention Extremities: No CCE. Neuro/Psych: Oriented x3.  Alert.  Moves all 4 limbs.  No tremor or gross weakness/deficit.  Intake/Output from previous day: 06/12 0701 - 06/13 0700 In: 1036.4 [I.V.:889.8; IV Piggyback:146.6] Out: -   Intake/Output this shift: Total I/O In: 50 [P.O.:50] Out: -   Lab Results: Recent Labs    07/20/20 1623 07/21/20 0146 07/21/20 1510 07/22/20 0319  WBC 14.5* 7.8  --  7.2  HGB 11.7* 8.9* 10.5* 9.7*  HCT 35.3* 26.9* 32.5* 29.3*  PLT 209 128*  --  155   BMET Recent Labs    07/20/20 0225 07/21/20 0146 07/22/20 0319  NA 136 135 140  K 3.8 3.3* 3.9  CL 101 102 107  CO2 23 27 28   GLUCOSE 116* 107* 103*  BUN 14 12 9   CREATININE 1.61* 1.50* 1.49*  CALCIUM 8.2* 7.6* 8.0*   LFT Recent Labs    07/20/20 0225 07/21/20 0146 07/22/20 0319  PROT 5.7* 4.7* 4.9*  ALBUMIN 2.8* 2.1* 2.2*  AST 276* 72* 30  ALT 183* 94* 63*  ALKPHOS 269* 184* 147*  BILITOT 2.2* 0.8 0.8   PT/INR Recent Labs    07/19/20 1922 07/20/20 0225  LABPROT 13.9 15.1  INR 1.1 1.2    Hepatitis Panel No results for input(s): HEPBSAG, HCVAB, HEPAIGM, HEPBIGM in the last 72 hours.  Studies/Results: DG ERCP BILIARY & PANCREATIC DUCTS  Result Date: 07/20/2020 CLINICAL DATA:  75 year old female with a history of biliary ductal dilatation EXAM: ERCP TECHNIQUE: Multiple spot images obtained with the fluoroscopic device and submitted for interpretation post-procedure. FLUOROSCOPY TIME:  Fluoroscopy Time:  4 minutes 11 seconds COMPARISON:  CT 07/19/2020 FINDINGS: Limited intraoperative fluoroscopic spot images during ERCP. Initial image demonstrates the endoscope projecting over the upper abdomen. Subsequently there is cannulation of the ampulla with retrograde infusion of contrast. Final image demonstrates placement of a plastic biliary stent. The ill-defined filling defects in the ductal system just above the ampulla may represent air bubbles or debris/stones. IMPRESSION: Limited images during ERCP demonstrates findings as above, with placement of plastic biliary stent. Signed, 61. 09/18/2020, [facility] Electronically Signed   By: Reyne Dumas D.O.   On: 07/20/2020 13:08    Scheduled Meds:  prednisoLONE acetate  1 drop Right Eye QID   Continuous Infusions:  lactated ringers with kcl 50 mL/hr at 07/21/20 1521   piperacillin-tazobactam (ZOSYN)  IV 3.375 g (07/22/20 0107)   PRN Meds:.morphine injection, ondansetron (ZOFRAN)  IV   ASSESMENT:      Choledocholithiasis.      6/12 ERCP w sphinct.  Ampulla win duodenal tic.  Single stone extracted, 2 larger stones refractory to balloon sweep remain, plastic stent placed.   Oozing blood at sphincterotomy site treated/subsided w epi. Day 3 Zosyn.   LFTs improving.  Leukocytosis resolved.      [name].  Hgb 13.3 >> 8.9 >>  9.7.  No previous colonoscopy or EGD.  FOBT status unknown.    AKI.     PLAN      Repeat ERCP w Dr Mansouraty, tmrw time TBD.   .  Patient needs PCP referral as she does not have one.  Consider colonoscopy in a few months after full recovery.  General surgery consult for eventual cholecystectomy.       Given diarrhea, which could be side effect of antibiotic, do we need to continue the Zosyn?    Becky Turner  07/22/2020, 8:27 AM Phone [phone]  

## 2020-07-23 ENCOUNTER — Encounter (HOSPITAL_COMMUNITY): Payer: Self-pay | Admitting: Internal Medicine

## 2020-07-23 ENCOUNTER — Encounter (HOSPITAL_COMMUNITY)
Admission: EM | Disposition: A | Discharge status: Home / Self Care | Payer: Self-pay | Source: Home / Self Care | Attending: Family Medicine

## 2020-07-23 ENCOUNTER — Inpatient Hospital Stay (HOSPITAL_COMMUNITY): Payer: Medicare Other | Admitting: Registered Nurse

## 2020-07-23 ENCOUNTER — Inpatient Hospital Stay (HOSPITAL_COMMUNITY): Payer: Medicare Other

## 2020-07-23 DIAGNOSIS — Z4659 Encounter for fitting and adjustment of other gastrointestinal appliance and device: Secondary | ICD-10-CM

## 2020-07-23 DIAGNOSIS — K3189 Other diseases of stomach and duodenum: Secondary | ICD-10-CM

## 2020-07-23 HISTORY — PX: REMOVAL OF STONES: SHX5545

## 2020-07-23 HISTORY — PX: BILIARY STENT PLACEMENT: SHX5538

## 2020-07-23 HISTORY — PX: STENT REMOVAL: SHX6421

## 2020-07-23 HISTORY — PX: ENDOSCOPIC RETROGRADE CHOLANGIOPANCREATOGRAPHY (ERCP) WITH PROPOFOL: SHX5810

## 2020-07-23 HISTORY — PX: SPYGLASS CHOLANGIOSCOPY: SHX5441

## 2020-07-23 HISTORY — PX: BIOPSY: SHX5522

## 2020-07-23 HISTORY — PX: SPYGLASS LITHOTRIPSY: SHX5537

## 2020-07-23 LAB — COMPREHENSIVE METABOLIC PANEL
ALT: 48 U/L — ABNORMAL HIGH (ref 0–44)
AST: 21 U/L (ref 15–41)
Albumin: 2.4 g/dL — ABNORMAL LOW (ref 3.5–5.0)
Alkaline Phosphatase: 128 U/L — ABNORMAL HIGH (ref 38–126)
Anion gap: 6 (ref 5–15)
BUN: 5 mg/dL — ABNORMAL LOW (ref 8–23)
CO2: 28 mmol/L (ref 22–32)
Calcium: 8.3 mg/dL — ABNORMAL LOW (ref 8.9–10.3)
Chloride: 106 mmol/L (ref 98–111)
Creatinine, Ser: 1.2 mg/dL — ABNORMAL HIGH (ref 0.44–1.00)
GFR, Estimated: 47 mL/min — ABNORMAL LOW (ref >60)
Glucose, Bld: 89 mg/dL (ref 70–99)
Potassium: 3.3 mmol/L — ABNORMAL LOW (ref 3.5–5.1)
Sodium: 140 mmol/L (ref 135–145)
Total Bilirubin: 0.6 mg/dL (ref 0.3–1.2)
Total Protein: 5.1 g/dL — ABNORMAL LOW (ref 6.5–8.1)

## 2020-07-23 LAB — CBC
HCT: 31.5 % — ABNORMAL LOW (ref 36.0–46.0)
Hemoglobin: 10 g/dL — ABNORMAL LOW (ref 12.0–15.0)
MCH: 28 pg (ref 26.0–34.0)
MCHC: 31.7 g/dL (ref 30.0–36.0)
MCV: 88.2 fL (ref 80.0–100.0)
Platelets: 165 10*3/uL (ref 150–400)
RBC: 3.57 MIL/uL — ABNORMAL LOW (ref 3.87–5.11)
RDW: 12.4 % (ref 11.5–15.5)
WBC: 5 10*3/uL (ref 4.0–10.5)
nRBC: 0 % (ref 0.0–0.2)

## 2020-07-23 SURGERY — ENDOSCOPIC RETROGRADE CHOLANGIOPANCREATOGRAPHY (ERCP) WITH PROPOFOL
Anesthesia: General

## 2020-07-23 MED ORDER — GLUCAGON HCL RDNA (DIAGNOSTIC) 1 MG IJ SOLR
INTRAMUSCULAR | Status: DC | PRN
  Administered 2020-07-23 (×2): .25 mg via INTRAVENOUS

## 2020-07-23 MED ORDER — LIDOCAINE HCL (CARDIAC) PF 100 MG/5ML IV SOSY
PREFILLED_SYRINGE | INTRAVENOUS | Status: DC | PRN
  Administered 2020-07-23: 100 mg via INTRATRACHEAL

## 2020-07-23 MED ORDER — LACTATED RINGERS IV SOLN: INTRAVENOUS | Status: DC

## 2020-07-23 MED ORDER — PANTOPRAZOLE SODIUM 40 MG PO TBEC
40.0000 mg | DELAYED_RELEASE_TABLET | Freq: Every day | ORAL | Status: DC
  Administered 2020-07-23 – 2020-07-25 (×2): 40 mg via ORAL
  Filled 2020-07-23 (×2): qty 1

## 2020-07-23 MED ORDER — SUCCINYLCHOLINE CHLORIDE 200 MG/10ML IV SOSY
PREFILLED_SYRINGE | INTRAVENOUS | Status: DC | PRN
  Administered 2020-07-23: 120 mg via INTRAVENOUS

## 2020-07-23 MED ORDER — PHENYLEPHRINE 40 MCG/ML (10ML) SYRINGE FOR IV PUSH (FOR BLOOD PRESSURE SUPPORT)
PREFILLED_SYRINGE | INTRAVENOUS | Status: DC | PRN
  Administered 2020-07-23 (×2): 80 ug via INTRAVENOUS

## 2020-07-23 MED ORDER — ROCURONIUM BROMIDE 10 MG/ML (PF) SYRINGE
PREFILLED_SYRINGE | INTRAVENOUS | Status: DC | PRN
  Administered 2020-07-23: 10 mg via INTRAVENOUS
  Administered 2020-07-23: 30 mg via INTRAVENOUS

## 2020-07-23 MED ORDER — SCOPOLAMINE 1 MG/3DAYS TD PT72
MEDICATED_PATCH | TRANSDERMAL | Status: DC | PRN
  Administered 2020-07-23: 1 via TRANSDERMAL

## 2020-07-23 MED ORDER — SUGAMMADEX SODIUM 200 MG/2ML IV SOLN
INTRAVENOUS | Status: DC | PRN
  Administered 2020-07-23 (×2): 100 mg via INTRAVENOUS

## 2020-07-23 MED ORDER — CIPROFLOXACIN IN D5W 400 MG/200ML IV SOLN
INTRAVENOUS | Status: AC
  Filled 2020-07-23: qty 200

## 2020-07-23 MED ORDER — PROPOFOL 10 MG/ML IV BOLUS
INTRAVENOUS | Status: DC | PRN
  Administered 2020-07-23: 150 mg via INTRAVENOUS

## 2020-07-23 MED ORDER — CIPROFLOXACIN IN D5W 400 MG/200ML IV SOLN
400.0000 mg | Freq: Once | INTRAVENOUS | Status: AC
  Administered 2020-07-23: 400 mg via INTRAVENOUS

## 2020-07-23 MED ORDER — DEXAMETHASONE SODIUM PHOSPHATE 10 MG/ML IJ SOLN
INTRAMUSCULAR | Status: DC | PRN
  Administered 2020-07-23: 5 mg via INTRAVENOUS

## 2020-07-23 MED ORDER — CEFAZOLIN SODIUM-DEXTROSE 2-4 GM/100ML-% IV SOLN
2.0000 g | INTRAVENOUS | Status: AC
  Administered 2020-07-24: 2 g via INTRAVENOUS
  Filled 2020-07-23: qty 100

## 2020-07-23 MED ORDER — ONDANSETRON HCL 4 MG/2ML IJ SOLN
INTRAMUSCULAR | Status: DC | PRN
  Administered 2020-07-23: 4 mg via INTRAVENOUS

## 2020-07-23 MED ORDER — INDOMETHACIN 50 MG RE SUPP: 100.0000 mg | Freq: Once | RECTAL | Status: AC

## 2020-07-23 MED ORDER — SODIUM CHLORIDE 0.9 % IV SOLN
INTRAVENOUS | Status: DC | PRN
  Administered 2020-07-23: 25 mL

## 2020-07-23 MED ORDER — INDOMETHACIN 50 MG RE SUPP
RECTAL | Status: AC
  Filled 2020-07-23: qty 1

## 2020-07-23 MED ORDER — SCOPOLAMINE 1 MG/3DAYS TD PT72
MEDICATED_PATCH | TRANSDERMAL | Status: AC
  Filled 2020-07-23: qty 1

## 2020-07-23 MED ORDER — FENTANYL CITRATE (PF) 100 MCG/2ML IJ SOLN
INTRAMUSCULAR | Status: DC | PRN
  Administered 2020-07-23: 100 ug via INTRAVENOUS

## 2020-07-23 MED ORDER — POTASSIUM CHLORIDE CRYS ER 20 MEQ PO TBCR
40.0000 meq | EXTENDED_RELEASE_TABLET | Freq: Once | ORAL | Status: AC
  Administered 2020-07-23: 40 meq via ORAL
  Filled 2020-07-23: qty 2

## 2020-07-23 MED ORDER — INDOMETHACIN 50 MG RE SUPP
RECTAL | Status: DC | PRN
  Administered 2020-07-23: 100 mg via RECTAL

## 2020-07-23 MED ORDER — GLUCAGON HCL RDNA (DIAGNOSTIC) 1 MG IJ SOLR
INTRAMUSCULAR | Status: AC
  Filled 2020-07-23: qty 1

## 2020-07-23 NOTE — Interval H&P Note (Signed)
History and Physical Interval Note:  07/23/2020 7:52 AM  Becky Turner  has presented today for surgery, with the diagnosis of Retained CBD stones after ERCP, plastic biliary stent placement on 6/12..  The various methods of treatment have been discussed with the patient and family. After consideration of risks, benefits and other options for treatment, the patient has consented to  Procedure(s): ENDOSCOPIC RETROGRADE CHOLANGIOPANCREATOGRAPHY (ERCP) WITH PROPOFOL (N/A) as a surgical intervention.  The patient's history has been reviewed, patient examined, no change in status, stable for surgery.  I have reviewed the patient's chart and labs.  Questions were answered to the patient's satisfaction.    The risks of an ERCP were discussed at length, including but not limited to the risk of perforation, bleeding, abdominal pain, post-ERCP pancreatitis (while usually mild can be severe and even life threatening).    [employer]

## 2020-07-23 NOTE — Transfer of Care (Signed)
Immediate Anesthesia Transfer of Care Note  Patient: Becky Turner  Procedure(s) Performed: ENDOSCOPIC RETROGRADE CHOLANGIOPANCREATOGRAPHY (ERCP) WITH PROPOFOL REMOVAL OF STONES BILIARY STENT PLACEMENT SPYGLASS LITHOTRIPSY BIOPSY  Patient Location: endo   Anesthesia Type:General  Level of Consciousness: drowsy, patient cooperative and responds to stimulation  Airway & Oxygen Therapy: Patient Spontanous Breathing and Patient connected to face mask oxygen  Post-op Assessment: Report given to RN and Post -op Vital signs reviewed and stable  Post vital signs: Reviewed and stable  Last Vitals:  Vitals Value Taken Time  BP 135/49 07/23/20 1103  Temp    Pulse 63 07/23/20 1104  Resp 15 07/23/20 1104  SpO2 100 % 07/23/20 1104  Vitals shown include unvalidated device data.  Last Pain:  Vitals:   07/23/20 0824  TempSrc: Axillary  PainSc: 5       Patients Stated Pain Goal: 2 (07/19/20 2219)  Complications: No notable events documented.

## 2020-07-23 NOTE — Progress Notes (Signed)
PROGRESS NOTE  Becky Turner  WUJ:[id number] DOB: 03-01-1945 DOA: 07/19/2020 PCP: Shawnee Knapp, MD  Outpatient Specialists: Cardiology, Dr. Einar Gip Brief Narrative: Becky Turner is a 75 y.o. female with a history of HTN, HLD, stage IIIa CKD who presented to the ED 6/10 with near syncopal event and several weeks of epigastric pain with nausea. She was hypotensive and afebrile with LFT elevations, Creatinine elevated to 1.81 above baseline ~1.2, hs Troponin 13 and, WBC 7.8k, hgb 13.3g/dl. CT abd/pelvis revealed marked biliary ductal dilatation and distal CBD stones up to 1.2cm. IVF given and patient was taken to ERCP the following morning where some stones were removed and plastic stent placed. The procedure was made difficult due to papilla location within diverticulum. Hospital stay complicated by hypotension and rising leukocytosis for which zosyn was added. She has remained afebrile with improved blood pressure, and may be developing antibiotic-associated diarrhea, so antibiotics are stopped. Repeat ERCP performed 07/23/2020 with electrohydraulic lithotripsy of 2 obstructing CBD stones and stent replacement. Cholecystectomy is planned this admission.  Assessment & Plan: Principal Problem:   Choledocholithiasis Active Problems:   Essential hypertension   Hyperlipidemia, familial, high LDL   Acute kidney injury superimposed on CKD (HCC)   Pain of upper abdomen   Elevated LFTs   Bleeding from sphincterotomy site  Choledocholithiasis with biliary obstruction: s/p partial clearing at ERCP 6/11 with sphincterotomy, plastic CBD stent placement.  - Diet per GI. Will defer to them/surgery with plans for cholecystectomy this admission. - Trend CMP, LFTs improved, severely dilated CBD on ERCP noted.  Hypotension: Improved. - WBC normalized, zosyn stopped.  - Hold IVF now that BP better and diet is advanced to soft.  Erosive gastropathy: Noted on ERCP.  - PPI started per GI - Biopsy from 6/14 pending for H.  pylori  Acute blood loss anemia: Oozing from sphincterotomy site treated with epinephrine.  - Hgb stabilized. No bleeding noted.   AKI on stage IIIa CKD:  - Holding diuretic. CrCl right at 30, has continued improvement.  Thrombocytopenia: Resolved.  Hypokalemia:  - Repeat supplementation.  Essential HTN: Holding antihypertensives.  s/p right corneal transplant:  - Continue home prednisone gtt  Hyperlipidemia:  - Return to regular praluent infusions as outpatient after discharge.   DVT prophylaxis: SCDs Code Status: Full Family Communication: Daughter at bedside Disposition Plan:  Status is: Inpatient  Remains inpatient appropriate because:Ongoing diagnostic testing needed not appropriate for outpatient work up and IV treatments appropriate due to intensity of illness or inability to take PO  Dispo: The patient is from: Home              Anticipated d/c is to: Home              Patient currently is not medically stable to d/c.   Difficult to place patient No  Consultants:  [facility]  Procedures:  07/23/2020 ERCP Dr. Rush Landmark: Impression:       - Erosive gastropathy with no bleeding and no                           stigmata of recent bleeding. Biopsied for HP.                           - No gross lesions in the duodenal bulb, in the  first portion of the duodenum and in the second                           portion of the duodenum.                           - The major papilla was on the rim of a                           diverticulum. Prior biliary sphincterotomy appeared                           open.                           - One visibly patent stent from the biliary tree                           was seen in the major papilla. This was removed.                           - The entire main bile duct was severely dilated.                           - Choledocholithiasis was found. Complete                           incomplete initial  removal with sweeping.                           - Cholangioscopy performed. Abnormal biliary tract                           mucosa was found noted in the cystic duct                           (low-lying insertion).                           - Filling defects consistent with a stone and                           sludge were seen on cholangioscopy.                           - Electohydraulic Lithotripsy was successful to 2                           large stones.                           - The biliary tree was swept.                           - One plastic biliary stent was placed into the  common bile duct to ensure that the small stone                           fragments from significant EHL would not lead to                           collection distally in this very large biliary duct                           leading to occlusion. This will be able to                           hopefully decompress the biliary system over the                           coming weeks and eventually do a completion clear                           out after cholecystectomy.  Recommendation: - The patient will be observed post-procedure,                           until all discharge criteria are met.                           - Return patient to hospital ward for ongoing care.                           - Check liver enzymes (AST, ALT, alkaline                           phosphatase, bilirubin) in the morning.                           - Watch for pancreatitis, bleeding, perforation,                           and cholangitis.                           - Await path results.                           - Start Protonix 40 mg daily.                           - Proceed to cholecystectomy during this admission                           as per surgical service (hopefully tomorrow).                           - Repeat ERCP in 3 months to remove stent.                           - The findings  and  recommendations were discussed with the patient.                           - The findings and recommendations were discussed with the patient's family.  07/20/2020 ERCP Dr. Fuller Plan:  Impression:        - The major papilla was located partially within a                           diverticulum.                           - The common bile duct was dilated, with stones                           causing an obstruction.                           - Choledocholithiasis was found. Partial removal                           was accomplished with biliary sphincterotomy,                           balloon clearing; a biliary stent was inserted.                           - A biliary sphincterotomy was performed.                           - The biliary tree was swept.                           - One plastic stent was placed into the common bile                           duct. Recommendation: - Avoid aspirin and nonsteroidal anti-inflammatory                           medicines for 1 week.                           - Clear liquid diet today.                           - Return patient to hospital ward for ongoing care.                           - Observe patient's clinical course following                           today's ERCP with therapeutic intervention.                           - Discuss with Dr. Rush Landmark regarding repeat ERCP in the near future to remove the remaining stones and biliary stent.  Antimicrobials: Zosyn 6/11 - 6/13.  Subjective:  Feeling well after ERCP. No nausea, pain, vomiting. Hasn't eaten yet. No fever or lightheadedness.   Objective: Vitals:   07/23/20 0824 07/23/20 1103 07/23/20 1112 07/23/20 1136  BP: (!) 146/55 (!) 135/49 (!) 133/47 (!) 119/55  Pulse: 79 63 63 63  Resp: (!) _0 Temp: 98.5 F (36.9 C) 97.9 F (36.6 C)  97.7 F (36.5 C)  TempSrc: Axillary Oral  Oral  SpO2: 99% 100% 100% 98%  Weight: 52.2 kg     Height: 5' (1.524 m)       Intake/Output  Summary (Last 24 hours) at 07/23/2020 1313 Last data filed at 07/23/2020 1025 Gross per 24 hour  Intake 900 ml  Output 0 ml  Net 900 ml   Filed Weights   07/19/20 1624 07/20/20 1041 07/23/20 0824  Weight: 52.6 kg 52.2 kg 52.2 kg   Gen: 75 y.o. female in no distress Pulm: Nonlabored breathing room air. Clear. CV: Regular rate and rhythm. No murmur, rub, or gallop. No JVD, no dependent edema. GI: Abdomen soft, not actually tender, non-distended, with normoactive bowel sounds.  Ext: Warm, no deformities Skin: No rashes, lesions or ulcers on visualized skin. Neuro: Alert and oriented. No focal neurological deficits. Psych: Judgement and insight appear fair. Mood euthymic & affect congruent. Behavior is appropriate.    Data Reviewed: I have personally reviewed following labs and imaging studies  CBC: Recent Labs  Lab 07/19/20 1527 07/20/20 0225 07/20/20 1623 07/21/20 0146 07/21/20 1510 07/22/20 0319 07/23/20 0333  WBC 7.8 13.0* 14.5* 7.8  --  7.2 5.0  NEUTROABS 6.9  --   --   --   --   --   --   HGB 13.3 10.1* 11.7* 8.9* 10.5* 9.7* 10.0*  HCT 41.2 30.8* 35.3* 26.9* 32.5* 29.3* 31.5*  MCV 89.8 88.5 87.4 87.1  --  87.7 88.2  PLT 231 168 209 128*  --  155 409   Basic Metabolic Panel: Recent Labs  Lab 07/19/20 1527 07/20/20 0225 07/21/20 0146 07/22/20 0319 07/23/20 0333  NA 133* 136 135 140 140  K 4.0 3.8 3.3* 3.9 3.3*  CL 94* 101 102 107 106  CO2 20* _1 GLUCOSE 152* 116* 107* 103* 89  BUN _2 5*  CREATININE 1.81* 1.61* 1.50* 1.49* 1.20*  CALCIUM 9.4 8.2* 7.6* 8.0* 8.3*   GFR: Estimated Creatinine Clearance: 29.1 mL/min (A) (by C-G formula based on SCr of 1.2 mg/dL (H)). Liver Function Tests: Recent Labs  Lab 07/19/20 1527 07/20/20 0225 07/21/20 0146 07/22/20 0319 07/23/20 0333  AST 189* 276* 72* 30 21  ALT 122* 183* 94* 63* 48*  ALKPHOS 322* 269* 184* 147* 128*  BILITOT 2.5* 2.2* 0.8 0.8 0.6  PROT 7.8 5.7* 4.7* 4.9* 5.1*  ALBUMIN 3.9 2.8*  2.1* 2.2* 2.4*   Recent Labs  Lab 07/19/20 1527  LIPASE 63*   No results for input(s): AMMONIA in the last 168 hours. Coagulation Profile: Recent Labs  Lab 07/19/20 1922 07/20/20 0225  INR 1.1 1.2   Cardiac Enzymes: No results for input(s): CKTOTAL, CKMB, CKMBINDEX, TROPONINI in the last 168 hours. BNP (last 3 results) No results for input(s): PROBNP in the last 8760 hours. HbA1C: No results for input(s): HGBA1C in the last 72 hours. CBG: No results for input(s): GLUCAP in the last 168 hours. Lipid Profile: No results for input(s): CHOL, HDL, LDLCALC, TRIG, CHOLHDL, LDLDIRECT in the last 72 hours. Thyroid Function Tests: No results for input(s): TSH,  T4TOTAL, FREET4, T3FREE, THYROIDAB in the last 72 hours. Anemia Panel: No results for input(s): VITAMINB12, FOLATE, FERRITIN, TIBC, IRON, RETICCTPCT in the last 72 hours. Urine analysis:    Component Value Date/Time   COLORURINE YELLOW 07/19/2020 1527   APPEARANCEUR CLEAR 07/19/2020 1527   LABSPEC 1.008 07/19/2020 1527   PHURINE 5.0 07/19/2020 1527   GLUCOSEU NEGATIVE 07/19/2020 1527   HGBUR SMALL (A) 07/19/2020 1527   BILIRUBINUR NEGATIVE 07/19/2020 1527   BILIRUBINUR negative 09/14/2015 0856   KETONESUR 20 (A) 07/19/2020 1527   PROTEINUR NEGATIVE 07/19/2020 1527   UROBILINOGEN 0.2 09/14/2015 0856   NITRITE NEGATIVE 07/19/2020 1527   LEUKOCYTESUR NEGATIVE 07/19/2020 1527   Recent Results (from the past 240 hour(s))  Resp Panel by RT-PCR (Flu A&B, Covid) Nasopharyngeal Swab     Status: None   Collection Time: 07/19/20  8:20 PM   Specimen: Nasopharyngeal Swab; Nasopharyngeal(NP) swabs in vial transport medium  Result Value Ref Range Status   SARS Coronavirus 2 by RT PCR NEGATIVE NEGATIVE Final    Comment: (NOTE) SARS-CoV-2 target nucleic acids are NOT DETECTED.  The SARS-CoV-2 RNA is generally detectable in upper respiratory specimens during the acute phase of infection. The lowest concentration of SARS-CoV-2 viral  copies this assay can detect is 138 copies/mL. A negative result does not preclude SARS-Cov-2 infection and should not be used as the sole basis for treatment or other patient management decisions. A negative result may occur with  improper specimen collection/handling, submission of specimen other than nasopharyngeal swab, presence of viral mutation(s) within the areas targeted by this assay, and inadequate number of viral copies(<138 copies/mL). A negative result must be combined with clinical observations, patient history, and epidemiological information. The expected result is Negative.  Fact Sheet for Patients:  EntrepreneurPulse.com.au  Fact Sheet for Healthcare Providers:  IncredibleEmployment.be  This test is no t yet approved or cleared by the [place] FDA and  has been authorized for detection and/or diagnosis of SARS-CoV-2 by FDA under an Emergency Use Authorization (EUA). This EUA will remain  in effect (meaning this test can be used) for the duration of the COVID-19 declaration under Section 564(b)(1) of the Act, 21 U.S.C.section 360bbb-3(b)(1), unless the authorization is terminated  or revoked sooner.       Influenza A by PCR NEGATIVE NEGATIVE Final   Influenza B by PCR NEGATIVE NEGATIVE Final    Comment: (NOTE) The Xpert Xpress SARS-CoV-2/FLU/RSV plus assay is intended as an aid in the diagnosis of influenza from Nasopharyngeal swab specimens and should not be used as a sole basis for treatment. Nasal washings and aspirates are unacceptable for Xpert Xpress SARS-CoV-2/FLU/RSV testing.  Fact Sheet for Patients: EntrepreneurPulse.com.au  Fact Sheet for Healthcare Providers: IncredibleEmployment.be  This test is not yet approved or cleared by the [place] FDA and has been authorized for detection and/or diagnosis of SARS-CoV-2 by FDA under an Emergency Use Authorization (EUA). This  EUA will remain in effect (meaning this test can be used) for the duration of the COVID-19 declaration under Section 564(b)(1) of the Act, 21 U.S.C. section 360bbb-3(b)(1), unless the authorization is terminated or revoked.  Performed at [facility], [place] [address], [place], [place] [address]       Radiology Studies: DG ERCP  Result Date: 07/23/2020 CLINICAL DATA:  75 year old female with repeat ERCP and removal of stent EXAM: ERCP TECHNIQUE: Multiple spot images obtained with the fluoroscopic device and submitted for interpretation post-procedure. FLUOROSCOPY TIME:  Fluoroscopy Time:  5 minutes 5  seconds COMPARISON:  07/20/2020, CT 07/19/2020 FINDINGS: Limited intraoperative fluoroscopic spot images during ERCP. Initial image demonstrates the endoscope projecting over the after abdomen with a plastic biliary stent. Subsequently there is placement of a safety wire and partial opacification of the extrahepatic biliary system. Multiple rounded filling defects present. Subsequent passage of a balloon retrieval catheter and placement of a new plastic biliary stent. IMPRESSION: Limited images during repeat ERCP demonstrates treatment of choledocholithiasis with passage of a balloon retrieval catheter and replacement of plastic biliary stent. Please refer to the dictated operative report for full details of intraoperative findings and procedure. Electronically Signed   By: Corrie Mckusick D.O.   On: 07/23/2020 11:05    Scheduled Meds:  pantoprazole  40 mg Oral Daily   prednisoLONE acetate  1 drop Right Eye QID   Continuous Infusions:  lactated ringers Stopped (07/23/20 1132)     LOS: 4 days   Time spent: 25 minutes.  Patrecia Pour, MD [facility] [email or web address] 07/23/2020, 1:13 PM

## 2020-07-23 NOTE — Discharge Instructions (Addendum)
[facility] ______[facility] LAPAROSCOPIC SURGERY: POST OP INSTRUCTIONS Always review your discharge instruction sheet given to you by the facility where your surgery was performed. IF YOU HAVE DISABILITY OR FAMILY LEAVE FORMS, YOU MUST BRING THEM TO THE OFFICE FOR PROCESSING.   DO NOT GIVE THEM TO YOUR DOCTOR.  A prescription for pain medication may be given to you upon discharge.  Take your pain medication as prescribed, if needed.  If narcotic pain medicine is not needed, then you may take acetaminophen (Tylenol) or ibuprofen (Advil) as needed. Take your usually prescribed medications unless otherwise directed. If you need a refill on your pain medication, please contact your pharmacy.  They will contact our office to request authorization. Prescriptions will not be filled after 5pm or on week-ends. You should follow a light diet the first few days after arrival home, such as soup and crackers, etc.  Be sure to include lots of fluids daily. Most patients will experience some swelling and bruising in the area of the incisions.  Ice packs will help.  Swelling and bruising can take several days to resolve.  It is common to experience some constipation if taking pain medication after surgery.  Increasing fluid intake and taking a stool softener (such as Colace) will usually help or prevent this problem from occurring.  A mild laxative (Milk of Magnesia or Miralax) should be taken according to package instructions if there are no bowel movements after 48 hours. Unless discharge instructions indicate otherwise, you may remove your bandages 24-48 hours after surgery, and you may shower at that time.  You may have steri-strips (small skin tapes) in place directly over the incision.  These strips should be left on the skin for 7-10 days.  If your surgeon used skin glue on the incision, you may shower in 24 hours.  The glue will flake off over the next 2-3 weeks.  Any sutures or staples will be  removed at the office during your follow-up visit. ACTIVITIES:  You may resume regular (light) daily activities beginning the next day--such as daily self-care, walking, climbing stairs--gradually increasing activities as tolerated.  You may have sexual intercourse when it is comfortable.  Refrain from any heavy lifting or straining until approved by your doctor. You may drive when you are no longer taking prescription pain medication, you can comfortably wear a seatbelt, and you can safely maneuver your car and apply brakes. RETURN TO WORK:  __________________________________________________________ You should see your doctor in the office for a follow-up appointment approximately 2-3 weeks after your surgery.  Make sure that you call for this appointment within a day or two after you arrive home to insure a convenient appointment time. OTHER INSTRUCTIONS: __________________________________________________________________________________________________________________________ __________________________________________________________________________________________________________________________ WHEN TO CALL YOUR DOCTOR: Fever over 101.0 Inability to urinate Continued bleeding from incision. Increased pain, redness, or drainage from the incision. Increasing abdominal pain  The clinic staff is available to answer your questions during regular business hours.  Please don't hesitate to call and ask to speak to one of the nurses for clinical concerns.  If you have a medical emergency, go to the nearest emergency room or call 911.  A surgeon from [facility] is always on call at the hospital. [address], [address], [place], [place]  [address] ? [address] [address], [place], [place]   [address] [phone] ? [phone] ? FAX [phone] Web site: [email or web address]  

## 2020-07-23 NOTE — Anesthesia Procedure Notes (Signed)

## 2020-07-23 NOTE — Op Note (Signed)
[facility] Patient Name: Becky Turner Procedure Date : 07/23/2020 MRN: [id number] Attending MD: Justice Britain , MD Date of Birth: September 15, 1945 CSN: [id number] Age: 75 Admit Type: Inpatient Procedure:                ERCP Indications:              Bile duct stone(s), Abnormal liver function test,                            Biliary stent removal Providers:                Justice Britain, MD, Kary Kos RN, RN, Ladona Ridgel, Technician, Dewitt Hoes, CRNA Referring MD:             Pricilla Riffle. Fuller Plan, MD, [facility], Surgical                            Service Medicines:                General Anesthesia, Cipro 400 mg IV, Indomethacin                            100 mg PR, Glucagon 4.58 mg IV Complications:            No immediate complications. Estimated Blood Loss:     Estimated blood loss was minimal. Procedure:                Pre-Anesthesia Assessment:                           - Prior to the procedure, a History and Physical                            was performed, and patient medications and                            allergies were reviewed. The patient's tolerance of                            previous anesthesia was also reviewed. The risks                            and benefits of the procedure and the sedation                            options and risks were discussed with the patient.                            All questions were answered, and informed consent                            was obtained. Prior Anticoagulants: The patient has  taken no previous anticoagulant or antiplatelet                            agents. ASA Grade Assessment: III - A patient with                            severe systemic disease. After reviewing the risks                            and benefits, the patient was deemed in                            satisfactory condition to undergo the procedure.                            After obtaining informed consent, the scope was                            passed under direct vision. Throughout the                            procedure, the patient's blood pressure, pulse, and                            oxygen saturations were monitored continuously. The                            TJF-Q180V ([id number]) Olympus Duodenoscope was                            introduced through the mouth, and used to inject                            contrast into and used to inject contrast into the                            bile duct. The ERCP was accomplished without                            difficulty. The patient tolerated the procedure. Scope In: Scope Out: Findings:      A biliary stent was visible on the scout film.      The upper GI tract was traversed under direct vision without detailed       examination. Multiple dispersed small erosions with no bleeding and no       stigmata of recent bleeding were found in the gastric body and in the       gastric antrum. Biopsies were taken on the greater curvature of the       stomach, on the lesser curvature of the stomach and at the incisura       through the ERCP scope with the cold forceps for histology and HP       evaluation. No gross lesions were noted in the duodenal bulb, in the       first portion of the duodenum and  in the second portion of the duodenum.       The major papilla was on the rim of a diverticulum. A biliary       sphincterotomy had been performed. The sphincterotomy appeared open. One       plastic biliary stent originating in the biliary tree was emerging from       the major papilla. The stent was visibly patent. It was removed from the       biliary tree using a snare.      A short 0.035 inch Soft Jagwire was passed into the biliary tree. The       Hydratome sphincterotome was passed over the guidewire and the bile duct       was then deeply cannulated. Contrast was injected. I personally        interpreted the bile duct images. Ductal flow of contrast was adequate.       Image quality was adequate. Contrast extended to the entire biliary       tree. Opacification of the entire opacified area was successful. The       main bile duct was severely dilated. The largest diameter was 18 mm. The       lower third of the main bile duct and middle third of the main bile duct       contained filling defects thought to be stones and sludge. To discover       objects, the biliary tree was swept with a retrieval balloon starting at       the bifurcation. A few stone fragments were removed. Two larger stones       remained and continued to to not be able to be grabbed and moved with       the balloon sweep. The cystic duct is low-lying.      With the size, decision was made to pursue attempt at [facility]. The bile duct       was explored endoscopically using the SpyGlass direct visualization       system. The SpyScope was advanced to the bifurcation. Visibility with       the scope was good. The lower third of the main bile duct and upper       third of the main bile duct contained two stones, the largest of which       was 15 mm in diameter. Abnormal mucosa characterized by congestion was       found in the cystic duct but otherwise seemed clear of significant stone       burden. Electrohydraulic lithotripsy was successful to both stones after       >250 EHLs. To discover objects, the biliary tree was swept with a       retrieval balloon starting at the bifurcation. Sludge was swept from the       duct. A few stone fragments were removed. No large stones remained. With       that being said, I had concern due to the significant stone fragments       and the large biliary duct that there could be risk of stone fragments       collecting in the distal duct and then leading to obstructio in the near       future. So decision made to replace stent to ensure adequate drainage.       One 10 Fr by 7 cm  plastic biliary stent with a single external flap and  a single internal flap was placed into the common bile duct. Bile flowed       through the stent. The stent was in good position.      A pancreatogram was not performed.      The duodenoscope was withdrawn from the patient. Impression:               - Erosive gastropathy with no bleeding and no                            stigmata of recent bleeding. Biopsied for HP.                           - No gross lesions in the duodenal bulb, in the                            first portion of the duodenum and in the second                            portion of the duodenum.                           - The major papilla was on the rim of a                            diverticulum. Prior biliary sphincterotomy appeared                            open.                           - One visibly patent stent from the biliary tree                            was seen in the major papilla. This was removed.                           - The entire main bile duct was severely dilated.                           - Choledocholithiasis was found. Complete                            incomplete initial removal with sweeping.                           - Cholangioscopy performed. Abnormal biliary tract                            mucosa was found noted in the cystic duct                            (low-lying insertion).                           - Filling defects  consistent with a stone and                            sludge were seen on cholangioscopy.                           - Electohydraulic Lithotripsy was successful to 2                            large stones.                           - The biliary tree was swept.                           - One plastic biliary stent was placed into the                            common bile duct to ensure that the small stone                            fragments from significant EHL would not lead to                             collection distally in this very large biliary duct                            leading to occlusion. This will be able to                            hopefully decompress the biliary system over the                            coming weeks and eventually do a completion clear                            out after cholecystectomy. Recommendation:           - The patient will be observed post-procedure,                            until all discharge criteria are met.                           - Return patient to hospital ward for ongoing care.                           - Check liver enzymes (AST, ALT, alkaline                            phosphatase, bilirubin) in the morning.                           - Watch for pancreatitis, bleeding, perforation,  and cholangitis.                           - Await path results.                           - Start Protonix 40 mg daily.                           - Proceed to cholecystectomy during this admission                            as per surgical service (hopefully tomorrow).                           - Repeat ERCP in 3 months to remove stent.                           - The findings and recommendations were discussed                            with the patient.                           - The findings and recommendations were discussed                            with the patient's family. Procedure Code(s):        --- Professional ---                           [phone], Endoscopic retrograde                            cholangiopancreatography (ERCP); with removal and                            exchange of stent(s), biliary or pancreatic duct,                            including pre- and post-dilation and guide wire                            passage, when performed, including sphincterotomy,                            when performed, each stent exchanged                           [id number], Endoscopic retrograde                             cholangiopancreatography (ERCP); with destruction                            of calculi, any method (eg, mechanical,  electrohydraulic, lithotripsy)                           [id number], Endoscopic cannulation of papilla with                            direct visualization of pancreatic/common bile                            duct(s) (List separately in addition to code(s) for                            primary procedure) Diagnosis Code(s):        --- Professional ---                           K31.89, Other diseases of stomach and duodenum                           Z96.89, Presence of other specified functional                            implants                           K83.9, Disease of biliary tract, unspecified                           K80.50, Calculus of bile duct without cholangitis                            or cholecystitis without obstruction                           Z46.59, Encounter for fitting and adjustment of                            other gastrointestinal appliance and device                           R94.5, Abnormal results of liver function studies                           K83.8, Other specified diseases of biliary tract                           R93.2, Abnormal findings on diagnostic imaging of                            liver and biliary tract CPT copyright 2019 [employer]. All rights reserved. The codes documented in this report are preliminary and upon coder review may  be revised to meet current compliance requirements. Justice Britain, MD 07/23/2020 11:12:34 AM Number of Addenda: 0

## 2020-07-23 NOTE — Progress Notes (Signed)
Progress Note  Day of Surgery  Subjective: CC: feeling well after her procedure. Daughter is bedside. She has been ambulating and tolerating diet without nausea, emesis, abdominal pain.  Objective: Vital signs in last 24 hours: Temp:  [97.7 F (36.5 C)-98.5 F (36.9 C)] 97.7 F (36.5 C) (06/14 1136) Pulse Rate:  [63-79] 63 (06/14 1136) Resp:  [13-23] 20 (06/14 1136) BP: (116-146)/(47-55) 119/55 (06/14 1136) SpO2:  [98 %-100 %] 98 % (06/14 1136) Weight:  [52.2 kg] 52.2 kg (06/14 0824) Last BM Date: 07/22/20  Intake/Output from previous day: 06/13 0701 - 06/14 0700 In: 250 [P.O.:250] Out: -  Intake/Output this shift: Total I/O In: 800 [I.V.:600; IV Piggyback:200] Out: 0   PE: General: pleasant, WD, female who is laying in bed in NAD HEENT: head is normocephalic, atraumatic.  Mouth is pink and moist Heart: Palpable radial pulses bilaterally Lungs: Respiratory effort nonlabored Abd: soft, NT, ND, no masses, hernias, or organomegaly MS: all 4 extremities are symmetrical with no cyanosis, clubbing, or edema. No calf TTP Skin: warm and dry with no masses, lesions, or rashes Psych: A&Ox3 with an appropriate affect.    Lab Results:  Recent Labs    07/22/20 0319 07/23/20 0333  WBC 7.2 5.0  HGB 9.7* 10.0*  HCT 29.3* 31.5*  PLT 155 165   BMET Recent Labs    07/22/20 0319 07/23/20 0333  NA 140 140  K 3.9 3.3*  CL 107 106  CO2 28 28  GLUCOSE 103* 89  BUN 9 5*  CREATININE 1.49* 1.20*  CALCIUM 8.0* 8.3*   PT/INR No results for input(s): LABPROT, INR in the last 72 hours. CMP     Component Value Date/Time   NA 140 07/23/2020 0333   NA 143 01/15/2020 0827   K 3.3 (L) 07/23/2020 0333   CL 106 07/23/2020 0333   CO2 28 07/23/2020 0333   GLUCOSE 89 07/23/2020 0333   BUN 5 (L) 07/23/2020 0333   BUN 21 01/15/2020 0827   CREATININE 1.20 (H) 07/23/2020 0333   CREATININE 0.92 09/14/2015 0840   CALCIUM 8.3 (L) 07/23/2020 0333   PROT 5.1 (L) 07/23/2020 0333    PROT 7.3 12/19/2018 0841   ALBUMIN 2.4 (L) 07/23/2020 0333   ALBUMIN 4.4 12/19/2018 0841   AST 21 07/23/2020 0333   ALT 48 (H) 07/23/2020 0333   ALKPHOS 128 (H) 07/23/2020 0333   BILITOT 0.6 07/23/2020 0333   BILITOT 0.4 12/19/2018 0841   GFRNONAA 47 (L) 07/23/2020 0333   GFRNONAA 63 09/14/2015 0840   GFRAA 50 (L) 01/15/2020 0827   GFRAA 73 09/14/2015 0840   Lipase     Component Value Date/Time   LIPASE 63 (H) 07/19/2020 1527       Studies/Results: DG ERCP  Result Date: 07/23/2020 CLINICAL DATA:  75 year old female with repeat ERCP and removal of stent EXAM: ERCP TECHNIQUE: Multiple spot images obtained with the fluoroscopic device and submitted for interpretation post-procedure. FLUOROSCOPY TIME:  Fluoroscopy Time:  5 minutes 5 seconds COMPARISON:  07/20/2020, CT 07/19/2020 FINDINGS: Limited intraoperative fluoroscopic spot images during ERCP. Initial image demonstrates the endoscope projecting over the after abdomen with a plastic biliary stent. Subsequently there is placement of a safety wire and partial opacification of the extrahepatic biliary system. Multiple rounded filling defects present. Subsequent passage of a balloon retrieval catheter and placement of a new plastic biliary stent. IMPRESSION: Limited images during repeat ERCP demonstrates treatment of choledocholithiasis with passage of a balloon retrieval catheter and replacement of plastic biliary  stent. Please refer to the dictated operative report for full details of intraoperative findings and procedure. Electronically Signed   By: Gilmer Mor D.O.   On: 07/23/2020 11:05    Anti-infectives: Anti-infectives (From admission, onward)    Start     Dose/Rate Route Frequency Ordered Stop   07/23/20 0830  ciprofloxacin (CIPRO) IVPB 400 mg        400 mg 200 mL/hr over 60 Minutes Intravenous  Once 07/23/20 0827 07/23/20 0925   07/20/20 1815  piperacillin-tazobactam (ZOSYN) IVPB 3.375 g  Status:  Discontinued        3.375  g 12.5 mL/hr over 240 Minutes Intravenous Every 8 hours 07/20/20 1806 07/22/20 0951        Assessment/Plan  Choledocholithiasis  - s/p ERCP 6/11 where common bile duct was dilated and choledocholithiasis found; partial removal was accomplished with biliary sphincterotomy, balloon clearing; one plastic stent was placed into the common bile duct - second ERCP 6/14 with further stones removed and new stent placed. - monitor LFTs and plan for laparoscopic cholecystectomy tomorrow. Discussed again with patient and daughter and all questions answered   ID - zosyn 6/11>>6/13, cipro 6/14 FEN - soft diet, NPO after MN VTE - SCDs, ok for chemical DVT prophylaxis from surgical standpoint Foley - none   HTN HLD CKD-IIIa S/p right corneal transplant Anemia   LOS: 4 days    Eric Form, [facility] 07/23/2020, 2:08 PM Please see Amion for pager number during day hours 7:00am-4:30pm

## 2020-07-23 NOTE — Progress Notes (Signed)
Patient taken down to OR for ERCP at 0800. Daughter arrived this morning and was able to sign consent form with myself as a witness. Awaiting patient's return to unit, no update as of now.

## 2020-07-23 NOTE — Anesthesia Preprocedure Evaluation (Addendum)
Anesthesia Evaluation  Patient identified by MRN, date of birth, ID band Patient awake    Reviewed: Allergy & Precautions, NPO status , Patient's Chart, lab work & pertinent test results, reviewed documented beta blocker date and time   History of Anesthesia Complications (+) PONV and history of anesthetic complications  Airway Mallampati: II  TM Distance: >3 FB Neck ROM: Full    Dental  (+) Dental Advisory Given, Chipped   Pulmonary neg pulmonary ROS,  07/19/2020 SARS coronavirus NEG   breath sounds clear to auscultation       Cardiovascular hypertension, Pt. on medications and Pt. on home beta blockers (-) angina Rhythm:Regular Rate:Normal  '17 Myoview: normal study, no ischemia, EF 59%   Neuro/Psych negative neurological ROS  negative psych ROS   GI/Hepatic GERD  Medicated,CBD stone: elevated LFTs N/v with acute cholecystits   Endo/Other  negative endocrine ROS  Renal/GU Renal InsufficiencyRenal disease     Musculoskeletal   Abdominal Normal abdominal exam  (+)   Peds  Hematology  (+) Blood dyscrasia (Hb 10.1), anemia ,   Anesthesia Other Findings   Reproductive/Obstetrics                            Anesthesia Physical  Anesthesia Plan  ASA: 3  Anesthesia Plan: General   Post-op Pain Management:    Induction: Intravenous and Rapid sequence  PONV Risk Score and Plan: 3 and Ondansetron, Dexamethasone, Treatment may vary due to age or medical condition and Scopolamine patch - Pre-op  Airway Management Planned: Oral ETT  Additional Equipment: None  Intra-op Plan:   Post-operative Plan: Extubation in OR  Informed Consent: I have reviewed the patients History and Physical, chart, labs and discussed the procedure including the risks, benefits and alternatives for the proposed anesthesia with the patient or authorized representative who has indicated his/her understanding and  acceptance.     Dental advisory given and Interpreter used for interveiw  Plan Discussed with: CRNA and Surgeon  Anesthesia Plan Comments: (Patient's Daughter interpreting)       Anesthesia Quick Evaluation

## 2020-07-24 ENCOUNTER — Inpatient Hospital Stay (HOSPITAL_COMMUNITY): Payer: Medicare Other | Admitting: Certified Registered"

## 2020-07-24 ENCOUNTER — Telehealth: Payer: Self-pay

## 2020-07-24 ENCOUNTER — Encounter (HOSPITAL_COMMUNITY)
Admission: EM | Disposition: A | Discharge status: Home / Self Care | Payer: Self-pay | Source: Home / Self Care | Attending: Family Medicine

## 2020-07-24 ENCOUNTER — Encounter (HOSPITAL_COMMUNITY): Payer: Self-pay | Admitting: Internal Medicine

## 2020-07-24 DIAGNOSIS — K838 Other specified diseases of biliary tract: Secondary | ICD-10-CM

## 2020-07-24 HISTORY — PX: CHOLECYSTECTOMY: SHX55

## 2020-07-24 LAB — CBC
HCT: 31.1 % — ABNORMAL LOW (ref 36.0–46.0)
Hemoglobin: 10 g/dL — ABNORMAL LOW (ref 12.0–15.0)
MCH: 28.2 pg (ref 26.0–34.0)
MCHC: 32.2 g/dL (ref 30.0–36.0)
MCV: 87.6 fL (ref 80.0–100.0)
Platelets: 163 10*3/uL (ref 150–400)
RBC: 3.55 MIL/uL — ABNORMAL LOW (ref 3.87–5.11)
RDW: 12.4 % (ref 11.5–15.5)
WBC: 5.2 10*3/uL (ref 4.0–10.5)
nRBC: 0 % (ref 0.0–0.2)

## 2020-07-24 LAB — SURGICAL PCR SCREEN
MRSA, PCR: NEGATIVE
Staphylococcus aureus: NEGATIVE

## 2020-07-24 LAB — COMPREHENSIVE METABOLIC PANEL
ALT: 41 U/L (ref 0–44)
AST: 18 U/L (ref 15–41)
Albumin: 2.6 g/dL — ABNORMAL LOW (ref 3.5–5.0)
Alkaline Phosphatase: 120 U/L (ref 38–126)
Anion gap: 10 (ref 5–15)
BUN: 7 mg/dL — ABNORMAL LOW (ref 8–23)
CO2: 24 mmol/L (ref 22–32)
Calcium: 8.2 mg/dL — ABNORMAL LOW (ref 8.9–10.3)
Chloride: 104 mmol/L (ref 98–111)
Creatinine, Ser: 1.34 mg/dL — ABNORMAL HIGH (ref 0.44–1.00)
GFR, Estimated: 41 mL/min — ABNORMAL LOW (ref >60)
Glucose, Bld: 119 mg/dL — ABNORMAL HIGH (ref 70–99)
Potassium: 4.7 mmol/L (ref 3.5–5.1)
Sodium: 138 mmol/L (ref 135–145)
Total Bilirubin: 0.7 mg/dL (ref 0.3–1.2)
Total Protein: 5.5 g/dL — ABNORMAL LOW (ref 6.5–8.1)

## 2020-07-24 LAB — SURGICAL PATHOLOGY

## 2020-07-24 SURGERY — LAPAROSCOPIC CHOLECYSTECTOMY
Anesthesia: General

## 2020-07-24 MED ORDER — ROCURONIUM BROMIDE 10 MG/ML (PF) SYRINGE
PREFILLED_SYRINGE | INTRAVENOUS | Status: AC
  Filled 2020-07-24: qty 10

## 2020-07-24 MED ORDER — BUPIVACAINE-EPINEPHRINE 0.25% -1:200000 IJ SOLN
INTRAMUSCULAR | Status: DC | PRN
  Administered 2020-07-24: 19 mL

## 2020-07-24 MED ORDER — CHLORHEXIDINE GLUCONATE 0.12 % MT SOLN
15.0000 mL | Freq: Once | OROMUCOSAL | Status: AC
  Administered 2020-07-24: 15 mL via OROMUCOSAL
  Filled 2020-07-24: qty 15

## 2020-07-24 MED ORDER — FENTANYL CITRATE (PF) 250 MCG/5ML IJ SOLN
INTRAMUSCULAR | Status: AC
  Filled 2020-07-24: qty 5

## 2020-07-24 MED ORDER — LACTATED RINGERS IV SOLN: INTRAVENOUS | Status: DC

## 2020-07-24 MED ORDER — ROCURONIUM BROMIDE 10 MG/ML (PF) SYRINGE
PREFILLED_SYRINGE | INTRAVENOUS | Status: DC | PRN
  Administered 2020-07-24: 40 mg via INTRAVENOUS

## 2020-07-24 MED ORDER — PROPOFOL 10 MG/ML IV BOLUS
INTRAVENOUS | Status: DC | PRN
  Administered 2020-07-24: 170 mg via INTRAVENOUS

## 2020-07-24 MED ORDER — FENTANYL CITRATE (PF) 250 MCG/5ML IJ SOLN
INTRAMUSCULAR | Status: DC | PRN
  Administered 2020-07-24 (×3): 100 ug via INTRAVENOUS

## 2020-07-24 MED ORDER — BUPIVACAINE-EPINEPHRINE (PF) 0.25% -1:200000 IJ SOLN
INTRAMUSCULAR | Status: AC
  Filled 2020-07-24: qty 30

## 2020-07-24 MED ORDER — ONDANSETRON HCL 4 MG/2ML IJ SOLN
INTRAMUSCULAR | Status: AC
  Filled 2020-07-24: qty 2

## 2020-07-24 MED ORDER — SUCCINYLCHOLINE CHLORIDE 200 MG/10ML IV SOSY
PREFILLED_SYRINGE | INTRAVENOUS | Status: DC | PRN
  Administered 2020-07-24: 120 mg via INTRAVENOUS

## 2020-07-24 MED ORDER — SUGAMMADEX SODIUM 200 MG/2ML IV SOLN
INTRAVENOUS | Status: DC | PRN
  Administered 2020-07-24 (×2): 100 mg via INTRAVENOUS

## 2020-07-24 MED ORDER — FENTANYL CITRATE (PF) 100 MCG/2ML IJ SOLN: 25.0000 ug | INTRAMUSCULAR | Status: DC | PRN

## 2020-07-24 MED ORDER — ACETAMINOPHEN 325 MG PO TABS
650.0000 mg | ORAL_TABLET | Freq: Four times a day (QID) | ORAL | Status: DC | PRN
  Administered 2020-07-25: 650 mg via ORAL
  Filled 2020-07-24: qty 2

## 2020-07-24 MED ORDER — OXYCODONE HCL 5 MG PO TABS: 2.5000 mg | ORAL_TABLET | ORAL | Status: DC | PRN

## 2020-07-24 MED ORDER — ORAL CARE MOUTH RINSE: 15.0000 mL | Freq: Once | OROMUCOSAL | Status: AC

## 2020-07-24 MED ORDER — LIDOCAINE HCL (PF) 2 % IJ SOLN
INTRAMUSCULAR | Status: DC | PRN
  Administered 2020-07-24: 80 mg via INTRADERMAL

## 2020-07-24 MED ORDER — DEXAMETHASONE SODIUM PHOSPHATE 10 MG/ML IJ SOLN
INTRAMUSCULAR | Status: DC | PRN
  Administered 2020-07-24: 10 mg via INTRAVENOUS

## 2020-07-24 MED ORDER — LIDOCAINE HCL (PF) 2 % IJ SOLN
INTRAMUSCULAR | Status: AC
  Filled 2020-07-24: qty 5

## 2020-07-24 MED ORDER — ONDANSETRON HCL 4 MG/2ML IJ SOLN
INTRAMUSCULAR | Status: DC | PRN
  Administered 2020-07-24: 4 mg via INTRAVENOUS

## 2020-07-24 SURGICAL SUPPLY — 49 items
ADH SKN CLS LQ APL DERMABOND (GAUZE/BANDAGES/DRESSINGS) ×1
APL PRP STRL LF DISP 70% ISPRP (MISCELLANEOUS) ×1
APPLIER CLIP 5 13 M/L LIGAMAX5 (MISCELLANEOUS) ×3
APR CLP MED LRG 5 ANG JAW (MISCELLANEOUS) ×1
BAG SPEC RTRVL 10 TROC 200 (ENDOMECHANICALS) ×1
BLADE CLIPPER SURG (BLADE) IMPLANT
CANISTER SUCT 3000ML PPV (MISCELLANEOUS) ×3 IMPLANT
CHLORAPREP W/TINT 26 (MISCELLANEOUS) ×3 IMPLANT
CLIP APPLIE 5 13 M/L LIGAMAX5 (MISCELLANEOUS) ×1 IMPLANT
COVER SURGICAL LIGHT HANDLE (MISCELLANEOUS) ×3 IMPLANT
COVER WAND RF STERILE (DRAPES) ×3 IMPLANT
CUTTER FLEX LINEAR 45M (STAPLE) ×3 IMPLANT
DERMABOND ADHESIVE PROPEN (GAUZE/BANDAGES/DRESSINGS) ×2
DERMABOND ADVANCED (GAUZE/BANDAGES/DRESSINGS) ×2
DERMABOND ADVANCED .7 DNX12 (GAUZE/BANDAGES/DRESSINGS) ×1 IMPLANT
DERMABOND ADVANCED .7 DNX6 (GAUZE/BANDAGES/DRESSINGS) ×1 IMPLANT
ELECT REM PT RETURN 9FT ADLT (ELECTROSURGICAL) ×3
ELECTRODE REM PT RTRN 9FT ADLT (ELECTROSURGICAL) ×1 IMPLANT
GLOVE BIO SURGEON STRL SZ8 (GLOVE) ×3 IMPLANT
GLOVE SRG 8 PF TXTR STRL LF DI (GLOVE) ×1 IMPLANT
GLOVE SURG UNDER POLY LF SZ8 (GLOVE) ×3
GOWN STRL REUS W/ TWL LRG LVL3 (GOWN DISPOSABLE) ×2 IMPLANT
GOWN STRL REUS W/ TWL XL LVL3 (GOWN DISPOSABLE) ×1 IMPLANT
GOWN STRL REUS W/TWL LRG LVL3 (GOWN DISPOSABLE) ×6
GOWN STRL REUS W/TWL XL LVL3 (GOWN DISPOSABLE) ×3
KIT BASIN OR (CUSTOM PROCEDURE TRAY) ×3 IMPLANT
KIT TURNOVER KIT B (KITS) ×3 IMPLANT
L-HOOK LAP DISP 36CM (ELECTROSURGICAL) ×3
LHOOK LAP DISP 36CM (ELECTROSURGICAL) ×1 IMPLANT
NEEDLE 22X1 1/2 (OR ONLY) (NEEDLE) ×3 IMPLANT
NS IRRIG 1000ML POUR BTL (IV SOLUTION) ×3 IMPLANT
PAD ARMBOARD 7.5X6 YLW CONV (MISCELLANEOUS) ×3 IMPLANT
PENCIL BUTTON HOLSTER BLD 10FT (ELECTRODE) ×3 IMPLANT
POUCH RETRIEVAL ECOSAC 10 (ENDOMECHANICALS) ×1 IMPLANT
POUCH RETRIEVAL ECOSAC 10MM (ENDOMECHANICALS) ×3
RELOAD STAPLE TA45 3.5 REG BLU (ENDOMECHANICALS) ×3 IMPLANT
SCISSORS LAP 5X35 DISP (ENDOMECHANICALS) ×3 IMPLANT
SET IRRIG TUBING LAPAROSCOPIC (IRRIGATION / IRRIGATOR) ×3 IMPLANT
SET TUBE SMOKE EVAC HIGH FLOW (TUBING) ×3 IMPLANT
SLEEVE ENDOPATH XCEL 5M (ENDOMECHANICALS) ×6 IMPLANT
SPECIMEN JAR SMALL (MISCELLANEOUS) ×3 IMPLANT
SUT VIC AB 4-0 PS2 27 (SUTURE) ×3 IMPLANT
TOWEL GREEN STERILE (TOWEL DISPOSABLE) ×3 IMPLANT
TOWEL GREEN STERILE FF (TOWEL DISPOSABLE) ×3 IMPLANT
TRAY LAPAROSCOPIC MC (CUSTOM PROCEDURE TRAY) ×3 IMPLANT
TROCAR XCEL 12X100 BLDLESS (ENDOMECHANICALS) ×3 IMPLANT
TROCAR XCEL BLUNT TIP 100MML (ENDOMECHANICALS) ×3 IMPLANT
TROCAR XCEL NON-BLD 5MMX100MML (ENDOMECHANICALS) ×3 IMPLANT
WATER STERILE IRR 1000ML POUR (IV SOLUTION) ×3 IMPLANT

## 2020-07-24 NOTE — Progress Notes (Signed)
Gastroenterology Inpatient Follow-up Note   PATIENT IDENTIFICATION  Becky Turner is a 75 y.o. female with a pmh significant for Hypertension, hyperlipidemia, GERD, choledocholithiasis (now status post ERCP with biliary stent in place). Hospital Day: 6  SUBJECTIVE  The patient is seen and evaluated with her daughter at the bedside.  Patient continues to have midepigastric abdominal discomfort but states it is continuing to improve and better than yesterday before her ERCP.  Her labs have been reviewed and she has downtrending LFTs.  Plan for potential operative resection of gallbladder later today.  She is hopeful that our next ERCP will be her last.  She is tolerating Protonix daily which was initiated after findings of gastric erosions.  Biopsies remain pending at this time.   OBJECTIVE  Scheduled Inpatient Medications:   pantoprazole  40 mg Oral Daily   prednisoLONE acetate  1 drop Right Eye QID   Continuous Inpatient Infusions:    ceFAZolin (ANCEF) IV     lactated ringers Stopped (07/23/20 1132)   PRN Inpatient Medications: morphine injection, ondansetron (ZOFRAN) IV   Physical Examination  Temp:  [97.7 F (36.5 C)-98.3 F (36.8 C)] 98.3 F (36.8 C) (06/15 1050) Pulse Rate:  [63-75] 65 (06/15 1050) Resp:  [15-20] 17 (06/15 1050) BP: (118-141)/(49-60) 141/49 (06/15 1050) SpO2:  [96 %-99 %] 96 % (06/15 1050) Temp (24hrs), Avg:98.2 F (36.8 C), Min:97.7 F (36.5 C), Max:98.3 F (36.8 C)  Weight: 52.2 kg GEN: NAD, appears younger than stated age, doesn't appear chronically ill, daughter at bedside who acts as interpreter PSYCH: Cooperative, without pressured speech EYE: Conjunctivae pink, sclerae anicteric ENT: MMM CV: Nontachycardic RESP: No audible wheezing GI: NABS, soft, tenderness to palpation in midepigastrium and right upper quadrant, volitional guarding present, no rebound MSK/EXT: No lower extremity edema SKIN: No jaundice NEURO:  Alert & Oriented x 3, no focal  deficits   Review of Data   Laboratory Studies   Recent Labs  Lab 07/24/20 0052  NA 138  K 4.7  CL 104  CO2 24  BUN 7*  CREATININE 1.34*  GLUCOSE 119*  CALCIUM 8.2*   Recent Labs  Lab 07/24/20 0052  AST 18  ALT 41  ALKPHOS 120    Recent Labs  Lab 07/22/20 0319 07/23/20 0333 07/24/20 0052  WBC 7.2 5.0 5.2  HGB 9.7* 10.0* 10.0*  HCT 29.3* 31.5* 31.1*  PLT 155 165 163   Recent Labs  Lab 07/19/20 1922 07/20/20 0225  INR 1.1 1.2    Imaging Studies  DG ERCP  Result Date: 07/23/2020 CLINICAL DATA:  75 year old female with repeat ERCP and removal of stent EXAM: ERCP TECHNIQUE: Multiple spot images obtained with the fluoroscopic device and submitted for interpretation post-procedure. FLUOROSCOPY TIME:  Fluoroscopy Time:  5 minutes 5 seconds COMPARISON:  07/20/2020, CT 07/19/2020 FINDINGS: Limited intraoperative fluoroscopic spot images during ERCP. Initial image demonstrates the endoscope projecting over the after abdomen with a plastic biliary stent. Subsequently there is placement of a safety wire and partial opacification of the extrahepatic biliary system. Multiple rounded filling defects present. Subsequent passage of a balloon retrieval catheter and placement of a new plastic biliary stent. IMPRESSION: Limited images during repeat ERCP demonstrates treatment of choledocholithiasis with passage of a balloon retrieval catheter and replacement of plastic biliary stent. Please refer to the dictated operative report for full details of intraoperative findings and procedure. Electronically Signed   By: Gilmer Mor D.O.   On: 07/23/2020 11:05    GI Procedures and Studies  ERCP - Erosive gastropathy with no bleeding and no stigmata of recent bleeding. Biopsied for HP. - No gross lesions in the duodenal bulb, in the first portion of the duodenum and in the second portion of the duodenum. - The major papilla was on the rim of a diverticulum. Prior biliary sphincterotomy  appeared open. - One visibly patent stent from the biliary tree was seen in the major papilla. This was removed. - The entire main bile duct was severely dilated. - Choledocholithiasis was found. Complete incomplete initial removal with sweeping. - Cholangioscopy performed. Abnormal biliary tract mucosa was found noted in the cystic duct (low-lying insertion). - Filling defects consistent with a stone and sludge were seen on cholangioscopy. - Electohydraulic Lithotripsy was successful to 2 large stones. - The biliary tree was swept. - One plastic biliary stent was placed into the common bile duct to ensure that the small stone fragments from significant EHL would not lead to collection distally in this very large biliary duct leading to occlusion. This will be able to hopefully decompress the biliary system over the coming weeks and eventually do a completion clear out after cholecystectomy.   ASSESSMENT  Becky Turner is a 75 y.o. femalewith a pmh significant for Hypertension, hyperlipidemia, GERD, choledocholithiasis (now status post ERCP with biliary stent in place).  The patient is hemodynamically stable.  Clinically seems to be continuing to have improvement.  We were able to make a significant headway in regards to the choledocholithiasis that she had after her EHL yesterday.  I left a biliary stent in place to ensure that there was adequate drainage in the setting of her significantly dilated duct to hopefully allow decompression of the biliary tree and minimize risk of small fragments occluding the distal duct.  She is planned for cholecystectomy hopefully later today with our surgical colleagues.  Our biopsies are pending still.  From a GI standpoint we will sign off but be available as needed.  I will work on arranging a follow-up in clinic in approximately 4 to 6 weeks and a follow-up ERCP in 2 to 3 months.  All patient questions were answered to the best of my ability, and the patient agrees  to the aforementioned plan of action with follow-up as indicated.   PLAN/RECOMMENDATIONS  Follow-up in clinic in 4 to 6 weeks Repeat ERCP in 2 to 3 months Cholecystectomy as per surgical service We will follow-up the gastric biopsies when they return   We will sign off at this time but please page/call with questions or concerns.   Corliss Parish, MD [facility] Office # [phone]    LOS: 5 days  Lemar Lofty  07/24/2020, 11:12 AM

## 2020-07-24 NOTE — Transfer of Care (Signed)
Immediate Anesthesia Transfer of Care Note  Patient: Becky Turner  Procedure(s) Performed: LAPAROSCOPIC CHOLECYSTECTOMY  Patient Location: PACU  Anesthesia Type:General  Level of Consciousness: awake and drowsy  Airway & Oxygen Therapy: Patient Spontanous Breathing and Patient connected to face mask oxygen  Post-op Assessment: Report given to RN and Post -op Vital signs reviewed and stable  Post vital signs: Reviewed and stable  Last Vitals:  Vitals Value Taken Time  BP 112/68 07/24/20 1450  Temp 36.2 C 07/24/20 1450  Pulse 85 07/24/20 1450  Resp 17 07/24/20 1450  SpO2 99 % 07/24/20 1450  Vitals shown include unvalidated device data.  Last Pain:  Vitals:   07/24/20 1235  TempSrc: Oral  PainSc: 2       Patients Stated Pain Goal: 2 (07/19/20 2219)  Complications: No notable events documented.

## 2020-07-24 NOTE — Telephone Encounter (Signed)
Patty, Please also schedule a follow-up in clinic with myself or one of the APP's in 4 to 6 weeks. Thanks. GM

## 2020-07-24 NOTE — Telephone Encounter (Signed)
-----   Message from Lemar Lofty., MD sent at 07/24/2020  4:26 AM EDT ----- Becky Turner, Please place this patient in for a follow-up ERCP in the next 2 to 3 months with me. Biliary stent removal and final cleanout biliary tree. Thanks. GM

## 2020-07-24 NOTE — Anesthesia Preprocedure Evaluation (Signed)
Anesthesia Evaluation  Patient identified by MRN, date of birth, ID band Patient awake    Reviewed: Allergy & Precautions, NPO status   Airway Mallampati: II  TM Distance: >3 FB     Dental   Pulmonary neg pulmonary ROS,    breath sounds clear to auscultation       Cardiovascular hypertension,  Rhythm:Regular Rate:Normal     Neuro/Psych negative neurological ROS     GI/Hepatic negative GI ROS, Neg liver ROS,   Endo/Other    Renal/GU Renal disease     Musculoskeletal   Abdominal   Peds  Hematology   Anesthesia Other Findings   Reproductive/Obstetrics                             Anesthesia Physical Anesthesia Plan  ASA: 3  Anesthesia Plan: General   Post-op Pain Management:    Induction: Intravenous  PONV Risk Score and Plan: 3 and Ondansetron, Dexamethasone and Midazolam  Airway Management Planned:   Additional Equipment:   Intra-op Plan:   Post-operative Plan: Extubation in OR  Informed Consent: I have reviewed the patients History and Physical, chart, labs and discussed the procedure including the risks, benefits and alternatives for the proposed anesthesia with the patient or authorized representative who has indicated his/her understanding and acceptance.     Dental advisory given  Plan Discussed with: CRNA and Anesthesiologist  Anesthesia Plan Comments:         Anesthesia Quick Evaluation

## 2020-07-24 NOTE — Plan of Care (Signed)
Pt reports minimal pain.  Denies nausea.  Ambulating in the halls with assistance.  Tolerating soup from home that daughter brings.  Hilton Sinclair BSN RN CMSRN   Problem: Nutrition: Goal: Adequate nutrition will be maintained Outcome: Progressing   Problem: Pain Managment: Goal: General experience of comfort will improve Outcome: Progressing

## 2020-07-24 NOTE — Progress Notes (Signed)
Patient ID: Becky Turner, female   DOB: 03/11/1945, 75 y.o.   MRN: [id number] 1 Day Post-Op   Subjective: No pain ROS negative except as listed above. Objective: Vital signs in last 24 hours: Temp:  [97.7 F (36.5 C)-98.3 F (36.8 C)] 98.3 F (36.8 C) (06/15 0557) Pulse Rate:  [63-75] 75 (06/15 0557) Resp:  [13-20] 16 (06/15 0557) BP: (118-135)/(47-60) 128/55 (06/15 0557) SpO2:  [97 %-100 %] 99 % (06/15 0557) Last BM Date: 07/22/20  Intake/Output from previous day: 06/14 0701 - 06/15 0700 In: 800 [I.V.:600; IV Piggyback:200] Out: 0  Intake/Output this shift: No intake/output data recorded.  General appearance: alert and cooperative Resp: clear to auscultation bilaterally Cardio: regular rate and rhythm GI: soft, NT  Lab Results: CBC  Recent Labs    07/23/20 0333 07/24/20 0052  WBC 5.0 5.2  HGB 10.0* 10.0*  HCT 31.5* 31.1*  PLT 165 163   BMET Recent Labs    07/23/20 0333 07/24/20 0052  NA 140 138  K 3.3* 4.7  CL 106 104  CO2 28 24  GLUCOSE 89 119*  BUN 5* 7*  CREATININE 1.20* 1.34*  CALCIUM 8.3* 8.2*   PT/INR No results for input(s): LABPROT, INR in the last 72 hours. ABG No results for input(s): PHART, HCO3 in the last 72 hours.  Invalid input(s): PCO2, PO2  Studies/Results: DG ERCP  Result Date: 07/23/2020 CLINICAL DATA:  75 year old female with repeat ERCP and removal of stent EXAM: ERCP TECHNIQUE: Multiple spot images obtained with the fluoroscopic device and submitted for interpretation post-procedure. FLUOROSCOPY TIME:  Fluoroscopy Time:  5 minutes 5 seconds COMPARISON:  07/20/2020, CT 07/19/2020 FINDINGS: Limited intraoperative fluoroscopic spot images during ERCP. Initial image demonstrates the endoscope projecting over the after abdomen with a plastic biliary stent. Subsequently there is placement of a safety wire and partial opacification of the extrahepatic biliary system. Multiple rounded filling defects present. Subsequent passage of a balloon  retrieval catheter and placement of a new plastic biliary stent. IMPRESSION: Limited images during repeat ERCP demonstrates treatment of choledocholithiasis with passage of a balloon retrieval catheter and replacement of plastic biliary stent. Please refer to the dictated operative report for full details of intraoperative findings and procedure. Electronically Signed   By: Gilmer Mor D.O.   On: 07/23/2020 11:05    Anti-infectives: Anti-infectives (From admission, onward)    Start     Dose/Rate Route Frequency Ordered Stop   07/24/20 0600  ceFAZolin (ANCEF) IVPB 2g/100 mL premix        2 g 200 mL/hr over 30 Minutes Intravenous To Short Stay 07/23/20 1447 07/25/20 0600   07/23/20 0830  ciprofloxacin (CIPRO) IVPB 400 mg        400 mg 200 mL/hr over 60 Minutes Intravenous  Once 07/23/20 0827 07/23/20 0925   07/20/20 1815  piperacillin-tazobactam (ZOSYN) IVPB 3.375 g  Status:  Discontinued        3.375 g 12.5 mL/hr over 240 Minutes Intravenous Every 8 hours 07/20/20 1806 07/22/20 0951       Assessment/Plan: Choledocholithiasis  - s/p ERCP 6/11 where common bile duct was dilated and choledocholithiasis found; partial removal was accomplished with biliary sphincterotomy, balloon clearing; one plastic stent was placed into the common bile duct - second ERCP 6/14 with further stones removed and new stent placed. - laparoscopic cholecystectomy today - procedure, risks, and benefits discussed with her and her daughter and consent signed.    ID - zosyn 6/11>>6/13, cipro 6/14 FEN - soft diet, NPO  for OR VTE - SCDs, ok for chemical DVT prophylaxis from surgical standpoint Foley - none   HTN HLD CKD-IIIa S/p right corneal transplant Anemia   LOS: 5 days    Violeta Gelinas, MD, MPH, FACS Trauma & General Surgery Use [email or web address] to contact on call provider  07/24/2020

## 2020-07-24 NOTE — Telephone Encounter (Signed)
Staff message to get pt scheduled for ERCP in 2 months. Schedulers to call pt with appt in clinic.

## 2020-07-24 NOTE — Anesthesia Procedure Notes (Signed)
Procedure Name: Intubation Date/Time: 07/24/2020 1:19 PM Performed by: Myna Bright, CRNA Pre-anesthesia Checklist: Patient identified, Emergency Drugs available, Suction available and Patient being monitored Patient Re-evaluated:Patient Re-evaluated prior to induction Oxygen Delivery Method: Circle system utilized Preoxygenation: Pre-oxygenation with 100% oxygen Induction Type: IV induction Ventilation: Mask ventilation without difficulty Laryngoscope Size: Mac and 3 Grade View: Grade I Tube type: Oral Tube size: 7.0 mm Number of attempts: 1 Airway Equipment and Method: Stylet Placement Confirmation: ETT inserted through vocal cords under direct vision, positive ETCO2 and breath sounds checked- equal and bilateral Secured at: 21 cm Tube secured with: Tape Dental Injury: Teeth and Oropharynx as per pre-operative assessment

## 2020-07-24 NOTE — Progress Notes (Signed)
PROGRESS NOTE  Becky Turner  DHW:[id number] DOB: Feb 26, 1945 DOA: 07/19/2020 PCP: Shawnee Knapp, MD  Outpatient Specialists: Cardiology, Dr. Einar Gip Brief Narrative: Becky Turner is a 75 y.o. female with a history of HTN, HLD, stage IIIa CKD who presented to the ED 6/10 with near syncopal event and several weeks of epigastric pain with nausea. She was hypotensive and afebrile with LFT elevations, Creatinine elevated to 1.81 above baseline ~1.2, hs Troponin 13 and, WBC 7.8k, hgb 13.3g/dl. CT abd/pelvis revealed marked biliary ductal dilatation and distal CBD stones up to 1.2cm. IVF given and patient was taken to ERCP the following morning where some stones were removed and plastic stent placed. The procedure was made difficult due to papilla location within diverticulum. Hospital stay complicated by hypotension and rising leukocytosis for which zosyn was added. She has remained afebrile with improved blood pressure, and may be developing antibiotic-associated diarrhea, so antibiotics are stopped. Repeat ERCP performed 07/23/2020 with electrohydraulic lithotripsy of 2 obstructing CBD stones and stent replacement. Cholecystectomy is planned 07/24/2020.   Assessment & Plan: Principal Problem:   Choledocholithiasis Active Problems:   Essential hypertension   Hyperlipidemia, familial, high LDL   Acute kidney injury superimposed on CKD (HCC)   Pain of upper abdomen   Elevated LFTs   Bleeding from sphincterotomy site  Choledocholithiasis with biliary obstruction: s/p partial clearing at ERCP 6/11 with sphincterotomy, plastic CBD stent placement.  - Diet per GI. Will defer to them/surgery with plans for cholecystectomy this admission. - Trend CMP, LFTs improved, severely dilated CBD on ERCP noted.  Hypotension: Improved. - WBC normalized, zosyn stopped.  - Hold IVF   Erosive gastropathy: Noted on ERCP.  - PPI started per GI - Biopsy from 6/14 still pending for H. pylori  Acute blood loss anemia: Oozing from  sphincterotomy site treated with epinephrine.  - Hgb stabilized. No bleeding noted.   AKI on stage IIIb CKD:  - will plan to restart diuretic 6/16 .  Thrombocytopenia: Resolved.  Hypokalemia: Supplemented with 25mq 6/14 with K 3.3 > 4.7 this AM.  - No further supplementation, monitor in AM.  Essential HTN: Holding antihypertensives.  s/p right corneal transplant:  - Continue home prednisone gtt  Hyperlipidemia:  - Return to regular praluent infusions as outpatient after discharge.   DVT prophylaxis: SCDs Code Status: Full Family Communication: None at bedside this AM. Disposition Plan:  Status is: Inpatient  Remains inpatient appropriate because:Ongoing diagnostic testing needed not appropriate for outpatient work up and IV treatments appropriate due to intensity of illness or inability to take PO  Dispo: The patient is from: Home              Anticipated d/c is to: Home              Patient currently is not medically stable to d/c.   Difficult to place patient No  Consultants:  [facility]  Procedures:  07/23/2020 ERCP Dr. MRush Landmark Impression:       - Erosive gastropathy with no bleeding and no                           stigmata of recent bleeding. Biopsied for HP.                           - No gross lesions in the duodenal bulb, in the  first portion of the duodenum and in the second                           portion of the duodenum.                           - The major papilla was on the rim of a                           diverticulum. Prior biliary sphincterotomy appeared                           open.                           - One visibly patent stent from the biliary tree                           was seen in the major papilla. This was removed.                           - The entire main bile duct was severely dilated.                           - Choledocholithiasis was found. Complete                           incomplete initial  removal with sweeping.                           - Cholangioscopy performed. Abnormal biliary tract                           mucosa was found noted in the cystic duct                           (low-lying insertion).                           - Filling defects consistent with a stone and                           sludge were seen on cholangioscopy.                           - Electohydraulic Lithotripsy was successful to 2                           large stones.                           - The biliary tree was swept.                           - One plastic biliary stent was placed into the  common bile duct to ensure that the small stone                           fragments from significant EHL would not lead to                           collection distally in this very large biliary duct                           leading to occlusion. This will be able to                           hopefully decompress the biliary system over the                           coming weeks and eventually do a completion clear                           out after cholecystectomy.  Recommendation: - The patient will be observed post-procedure,                           until all discharge criteria are met.                           - Return patient to hospital ward for ongoing care.                           - Check liver enzymes (AST, ALT, alkaline                           phosphatase, bilirubin) in the morning.                           - Watch for pancreatitis, bleeding, perforation,                           and cholangitis.                           - Await path results.                           - Start Protonix 40 mg daily.                           - Proceed to cholecystectomy during this admission                           as per surgical service (hopefully tomorrow).                           - Repeat ERCP in 3 months to remove stent.                           - The findings  and  recommendations were discussed with the patient.                           - The findings and recommendations were discussed with the patient's family.  07/20/2020 ERCP Dr. Fuller Plan:  Impression:        - The major papilla was located partially within a                           diverticulum.                           - The common bile duct was dilated, with stones                           causing an obstruction.                           - Choledocholithiasis was found. Partial removal                           was accomplished with biliary sphincterotomy,                           balloon clearing; a biliary stent was inserted.                           - A biliary sphincterotomy was performed.                           - The biliary tree was swept.                           - One plastic stent was placed into the common bile                           duct. Recommendation: - Avoid aspirin and nonsteroidal anti-inflammatory                           medicines for 1 week.                           - Clear liquid diet today.                           - Return patient to hospital ward for ongoing care.                           - Observe patient's clinical course following                           today's ERCP with therapeutic intervention.                           - Discuss with Dr. Rush Landmark regarding repeat ERCP in the near future to remove the remaining stones and biliary stent.  Antimicrobials: Zosyn 6/11 - 6/13.  Subjective:  Pt reports mild RUQ pain that is overall stable for days. NPO this AM but ate yesterday. No fevers.   Objective: Vitals:   07/23/20 1517 07/23/20 2028 07/24/20 0312 07/24/20 0557  BP: 129/60 122/60 (!) 118/49 (!) 128/55  Pulse: 66 63 65 75  Resp: _0 Temp: 98.1 F (36.7 C) 98.3 F (36.8 C) 98.2 F (36.8 C) 98.3 F (36.8 C)  TempSrc: Oral Oral Oral Oral  SpO2: 97% 97% 97% 99%  Weight:      Height:        Intake/Output Summary (Last 24  hours) at 07/24/2020 1003 Last data filed at 07/24/2020 0900 Gross per 24 hour  Intake 600 ml  Output 0 ml  Net 600 ml   Filed Weights   07/19/20 1624 07/20/20 1041 07/23/20 0824  Weight: 52.6 kg 52.2 kg 52.2 kg   Gen: 76 y.o. female in no distress Pulm: Nonlabored breathing room air. Clear. CV: Regular rate and rhythm. No murmur, rub, or gallop. No JVD, no dependent edema. GI: Abdomen soft, mild RUQ tenderness without rebound or guarding, non-distended, with normoactive bowel sounds.  Ext: Warm, no deformities Skin: No rashes, lesions or ulcers on visualized skin. No jaundice. Neuro: Alert and oriented. No focal neurological deficits. Psych: Judgement and insight appear fair. Mood euthymic & affect congruent. Behavior is appropriate.    Data Reviewed: I have personally reviewed following labs and imaging studies  CBC: Recent Labs  Lab 07/19/20 1527 07/20/20 0225 07/20/20 1623 07/21/20 0146 07/21/20 1510 07/22/20 0319 07/23/20 0333 07/24/20 0052  WBC 7.8   < > 14.5* 7.8  --  7.2 5.0 5.2  NEUTROABS 6.9  --   --   --   --   --   --   --   HGB 13.3   < > 11.7* 8.9* 10.5* 9.7* 10.0* 10.0*  HCT 41.2   < > 35.3* 26.9* 32.5* 29.3* 31.5* 31.1*  MCV 89.8   < > 87.4 87.1  --  87.7 88.2 87.6  PLT 231   < > 209 128*  --  155 165 163   < > = values in this interval not displayed.   Basic Metabolic Panel: Recent Labs  Lab 07/20/20 0225 07/21/20 0146 07/22/20 0319 07/23/20 0333 07/24/20 0052  NA 136 135 140 140 138  K 3.8 3.3* 3.9 3.3* 4.7  CL 101 102 107 106 104  CO2 _1 GLUCOSE 116* 107* 103* 89 119*  BUN _2 5* 7*  CREATININE 1.61* 1.50* 1.49* 1.20* 1.34*  CALCIUM 8.2* 7.6* 8.0* 8.3* 8.2*   GFR: Estimated Creatinine Clearance: 26.1 mL/min (A) (by C-G formula based on SCr of 1.34 mg/dL (H)). Liver Function Tests: Recent Labs  Lab 07/20/20 0225 07/21/20 0146 07/22/20 0319 07/23/20 0333 07/24/20 0052  AST 276* 72* _3 ALT 183* 94* 63* 48* 41   ALKPHOS 269* 184* 147* 128* 120  BILITOT 2.2* 0.8 0.8 0.6 0.7  PROT 5.7* 4.7* 4.9* 5.1* 5.5*  ALBUMIN 2.8* 2.1* 2.2* 2.4* 2.6*   Recent Labs  Lab 07/19/20 1527  LIPASE 63*   No results for input(s): AMMONIA in the last 168 hours. Coagulation Profile: Recent Labs  Lab 07/19/20 1922 07/20/20 0225  INR 1.1 1.2   Cardiac Enzymes: No results for input(s): CKTOTAL, CKMB, CKMBINDEX, TROPONINI in the last 168 hours. BNP (last 3 results) No results for input(s): PROBNP in the last 8760 hours. HbA1C: No results for  input(s): HGBA1C in the last 72 hours. CBG: No results for input(s): GLUCAP in the last 168 hours. Lipid Profile: No results for input(s): CHOL, HDL, LDLCALC, TRIG, CHOLHDL, LDLDIRECT in the last 72 hours. Thyroid Function Tests: No results for input(s): TSH, T4TOTAL, FREET4, T3FREE, THYROIDAB in the last 72 hours. Anemia Panel: No results for input(s): VITAMINB12, FOLATE, FERRITIN, TIBC, IRON, RETICCTPCT in the last 72 hours. Urine analysis:    Component Value Date/Time   COLORURINE YELLOW 07/19/2020 1527   APPEARANCEUR CLEAR 07/19/2020 1527   LABSPEC 1.008 07/19/2020 1527   PHURINE 5.0 07/19/2020 1527   GLUCOSEU NEGATIVE 07/19/2020 1527   HGBUR SMALL (A) 07/19/2020 1527   BILIRUBINUR NEGATIVE 07/19/2020 1527   BILIRUBINUR negative 09/14/2015 0856   KETONESUR 20 (A) 07/19/2020 1527   PROTEINUR NEGATIVE 07/19/2020 1527   UROBILINOGEN 0.2 09/14/2015 0856   NITRITE NEGATIVE 07/19/2020 1527   LEUKOCYTESUR NEGATIVE 07/19/2020 1527   Recent Results (from the past 240 hour(s))  Resp Panel by RT-PCR (Flu A&B, Covid) Nasopharyngeal Swab     Status: None   Collection Time: 07/19/20  8:20 PM   Specimen: Nasopharyngeal Swab; Nasopharyngeal(NP) swabs in vial transport medium  Result Value Ref Range Status   SARS Coronavirus 2 by RT PCR NEGATIVE NEGATIVE Final    Comment: (NOTE) SARS-CoV-2 target nucleic acids are NOT DETECTED.  The SARS-CoV-2 RNA is generally  detectable in upper respiratory specimens during the acute phase of infection. The lowest concentration of SARS-CoV-2 viral copies this assay can detect is 138 copies/mL. A negative result does not preclude SARS-Cov-2 infection and should not be used as the sole basis for treatment or other patient management decisions. A negative result may occur with  improper specimen collection/handling, submission of specimen other than nasopharyngeal swab, presence of viral mutation(s) within the areas targeted by this assay, and inadequate number of viral copies(<138 copies/mL). A negative result must be combined with clinical observations, patient history, and epidemiological information. The expected result is Negative.  Fact Sheet for Patients:  EntrepreneurPulse.com.au  Fact Sheet for Healthcare Providers:  IncredibleEmployment.be  This test is no t yet approved or cleared by the [place] FDA and  has been authorized for detection and/or diagnosis of SARS-CoV-2 by FDA under an Emergency Use Authorization (EUA). This EUA will remain  in effect (meaning this test can be used) for the duration of the COVID-19 declaration under Section 564(b)(1) of the Act, 21 U.S.C.section 360bbb-3(b)(1), unless the authorization is terminated  or revoked sooner.       Influenza A by PCR NEGATIVE NEGATIVE Final   Influenza B by PCR NEGATIVE NEGATIVE Final    Comment: (NOTE) The Xpert Xpress SARS-CoV-2/FLU/RSV plus assay is intended as an aid in the diagnosis of influenza from Nasopharyngeal swab specimens and should not be used as a sole basis for treatment. Nasal washings and aspirates are unacceptable for Xpert Xpress SARS-CoV-2/FLU/RSV testing.  Fact Sheet for Patients: EntrepreneurPulse.com.au  Fact Sheet for Healthcare Providers: IncredibleEmployment.be  This test is not yet approved or cleared by the [place] FDA  and has been authorized for detection and/or diagnosis of SARS-CoV-2 by FDA under an Emergency Use Authorization (EUA). This EUA will remain in effect (meaning this test can be used) for the duration of the COVID-19 declaration under Section 564(b)(1) of the Act, 21 U.S.C. section 360bbb-3(b)(1), unless the authorization is terminated or revoked.  Performed at [facility], [place] [address] [place],  [address]   Surgical pcr screen  Status: None   Collection Time: 07/23/20 10:27 PM   Specimen: Nasal Mucosa; Nasal Swab  Result Value Ref Range Status   MRSA, PCR NEGATIVE NEGATIVE Final   Staphylococcus aureus NEGATIVE NEGATIVE Final    Comment: (NOTE) The Xpert SA Assay (FDA approved for NASAL specimens in patients 66 years of age and older), is one component of a comprehensive surveillance program. It is not intended to diagnose infection nor to guide or monitor treatment. Performed at [facility], [place] [address] [place], [place] [address]       Radiology Studies: DG ERCP  Result Date: 07/23/2020 CLINICAL DATA:  75 year old female with repeat ERCP and removal of stent EXAM: ERCP TECHNIQUE: Multiple spot images obtained with the fluoroscopic device and submitted for interpretation post-procedure. FLUOROSCOPY TIME:  Fluoroscopy Time:  5 minutes 5 seconds COMPARISON:  07/20/2020, CT 07/19/2020 FINDINGS: Limited intraoperative fluoroscopic spot images during ERCP. Initial image demonstrates the endoscope projecting over the after abdomen with a plastic biliary stent. Subsequently there is placement of a safety wire and partial opacification of the extrahepatic biliary system. Multiple rounded filling defects present. Subsequent passage of a balloon retrieval catheter and placement of a new plastic biliary stent. IMPRESSION: Limited images during repeat ERCP demonstrates treatment of choledocholithiasis with passage of a balloon retrieval catheter and replacement of  plastic biliary stent. Please refer to the dictated operative report for full details of intraoperative findings and procedure. Electronically Signed   By: Corrie Mckusick D.O.   On: 07/23/2020 11:05    Scheduled Meds:  pantoprazole  40 mg Oral Daily   prednisoLONE acetate  1 drop Right Eye QID   Continuous Infusions:   ceFAZolin (ANCEF) IV     lactated ringers Stopped (07/23/20 1132)     LOS: 5 days   Time spent: 25 minutes.  Patrecia Pour, MD [facility] [email or web address] 07/24/2020, 10:03 AM

## 2020-07-24 NOTE — Op Note (Signed)
  07/19/2020 - 07/24/2020  2:24 PM  PATIENT:  Becky Turner  75 y.o. female  PRE-OPERATIVE DIAGNOSIS:  CHOLEDOCHOLITHIASIS  POST-OPERATIVE DIAGNOSIS:  CHOLEDOCHOLITHIASIS  PROCEDURE:  Procedure(s): LAPAROSCOPIC CHOLECYSTECTOMY  SURGEON:  Surgeon(s): Violeta Gelinas, MD  ASSISTANTS: none   ANESTHESIA:   local and general  EBL:  No intake/output data recorded.  BLOOD ADMINISTERED:none  DRAINS: none   SPECIMEN:  Excision  DISPOSITION OF SPECIMEN:  PATHOLOGY  COUNTS:  YES  DICTATION: .Dragon Dictation Procedure in detail: Informed consent was obtained.  She was brought to the operating room and general endotracheal anesthesia was administered by the anesthesia staff.  She received intravenous antibiotics.  Her abdomen was prepped and draped in a sterile fashion.  We did a timeout procedure.The infraumbilical region was infiltrated with local. Infraumbilical incision was made. Subcutaneous tissues were dissected down revealing the anterior fascia. This was divided sharply along the midline. Peritoneal cavity was entered under direct vision without complication. A 0 Vicryl pursestring was placed around the fascial opening. Hassan trocar was inserted into the abdomen. The abdomen was insufflated with carbon dioxide in standard fashion. Under direct vision a 5 mm epigastric and 5 mm lateral port x2 were placed.  Local was used at each port site.  Laparoscopic exploration revealed an inflamed gallbladder.  This was grasped by the dome and retracted superior medially.  The area of the infundibulum was retracted inferior laterally.  I opened the peritoneum and gently dissected.  First we identified the cystic artery.  This was clipped twice proximally and once distally and divided.  Next the infundibulum was noted not to taper very much with a rather large cystic duct.  In light of this and completely clear anatomy, I chose to transition to a dome down technique.  The dome of the gallbladder was  retracted away from the liver and we dissected the gallbladder off the liver bed achieving good hemostasis along the way.  We continued this all the way down carefully towards the infundibulum.  At this point, it was noted that the infundibulum tapered slightly but that the cystic duct was still quite large in diameter.  This is consistent with her history of choledocholithiasis.  It was a little bit too wide to place a clip across the cystic duct so we upsized the epigastric port to a 12 mm size.  I then used Endo GIA to divide the infundibulum right at the cystic duct.  There was excellent closure.  The gallbladder was placed in a bag and removed from the abdomen.  It was sent to pathology.  The liver bed was cauterized to get good hemostasis.  The area was irrigated.  The clips remain in good position on the cystic artery and the cystic duct stump was intact.  Liver bed was dry.  Irrigation fluid was evacuated.  Ports were removed under direct vision.  Pneumoperitoneum was released.  Infraumbilical fascia was closed by tying the pursestring.  All 4 wounds were irrigated and closed with 4-0 Vicryl followed by Dermabond.  All counts were correct.  She tolerated the procedure well without apparent complication and was taken recovery in stable condition.  PATIENT DISPOSITION:  PACU - hemodynamically stable.   Delay start of Pharmacological VTE agent (>24hrs) due to surgical blood loss or risk of bleeding:  no  Violeta Gelinas, MD, MPH, FACS Pager: [phone]  6/15/20222:24 PM

## 2020-07-25 ENCOUNTER — Encounter (HOSPITAL_COMMUNITY): Payer: Self-pay | Admitting: General Surgery

## 2020-07-25 ENCOUNTER — Encounter: Payer: Self-pay | Admitting: Gastroenterology

## 2020-07-25 LAB — COMPREHENSIVE METABOLIC PANEL
ALT: 46 U/L — ABNORMAL HIGH (ref 0–44)
AST: 59 U/L — ABNORMAL HIGH (ref 15–41)
Albumin: 2.6 g/dL — ABNORMAL LOW (ref 3.5–5.0)
Alkaline Phosphatase: 109 U/L (ref 38–126)
Anion gap: 7 (ref 5–15)
BUN: 10 mg/dL (ref 8–23)
CO2: 29 mmol/L (ref 22–32)
Calcium: 8 mg/dL — ABNORMAL LOW (ref 8.9–10.3)
Chloride: 106 mmol/L (ref 98–111)
Creatinine, Ser: 1.42 mg/dL — ABNORMAL HIGH (ref 0.44–1.00)
GFR, Estimated: 39 mL/min — ABNORMAL LOW (ref >60)
Glucose, Bld: 162 mg/dL — ABNORMAL HIGH (ref 70–99)
Potassium: 4.4 mmol/L (ref 3.5–5.1)
Sodium: 142 mmol/L (ref 135–145)
Total Bilirubin: 0.6 mg/dL (ref 0.3–1.2)
Total Protein: 5.5 g/dL — ABNORMAL LOW (ref 6.5–8.1)

## 2020-07-25 LAB — CBC
HCT: 31.9 % — ABNORMAL LOW (ref 36.0–46.0)
Hemoglobin: 10.3 g/dL — ABNORMAL LOW (ref 12.0–15.0)
MCH: 28.5 pg (ref 26.0–34.0)
MCHC: 32.3 g/dL (ref 30.0–36.0)
MCV: 88.1 fL (ref 80.0–100.0)
Platelets: 184 10*3/uL (ref 150–400)
RBC: 3.62 MIL/uL — ABNORMAL LOW (ref 3.87–5.11)
RDW: 12.6 % (ref 11.5–15.5)
WBC: 9.3 10*3/uL (ref 4.0–10.5)
nRBC: 0 % (ref 0.0–0.2)

## 2020-07-25 MED ORDER — AMOXICILLIN 500 MG PO CAPS: 1000.0000 mg | ORAL_CAPSULE | Freq: Two times a day (BID) | ORAL | 0 refills | Status: AC

## 2020-07-25 MED ORDER — ROSUVASTATIN CALCIUM 10 MG PO TABS: 10.0000 mg | ORAL_TABLET | Freq: Every evening | ORAL | Status: DC

## 2020-07-25 MED ORDER — METOPROLOL SUCCINATE ER 50 MG PO TB24: 50.0000 mg | ORAL_TABLET | Freq: Every day | ORAL | Status: DC

## 2020-07-25 MED ORDER — AMLODIPINE BESYLATE 5 MG PO TABS: 5.0000 mg | ORAL_TABLET | Freq: Every day | ORAL | Status: DC

## 2020-07-25 MED ORDER — OMEPRAZOLE 20 MG PO CPDR: 20.0000 mg | DELAYED_RELEASE_CAPSULE | Freq: Two times a day (BID) | ORAL | 0 refills | Status: DC

## 2020-07-25 MED ORDER — LISINOPRIL-HYDROCHLOROTHIAZIDE 20-25 MG PO TABS: 1.0000 | ORAL_TABLET | Freq: Every day | ORAL | Status: DC

## 2020-07-25 MED ORDER — CLARITHROMYCIN ER 500 MG PO TB24: 500.0000 mg | ORAL_TABLET | Freq: Two times a day (BID) | ORAL | 0 refills | Status: AC

## 2020-07-25 NOTE — Anesthesia Postprocedure Evaluation (Signed)
Anesthesia Post Note  Patient: Maurianna Fencl  Procedure(s) Performed: ENDOSCOPIC RETROGRADE CHOLANGIOPANCREATOGRAPHY (ERCP) WITH PROPOFOL REMOVAL OF STONES BILIARY STENT PLACEMENT SPYGLASS LITHOTRIPSY BIOPSY     Patient location during evaluation: Endoscopy Anesthesia Type: General Level of consciousness: sedated Pain management: pain level controlled Vital Signs Assessment: post-procedure vital signs reviewed and stable Respiratory status: spontaneous breathing Cardiovascular status: stable Postop Assessment: no apparent nausea or vomiting Anesthetic complications: no   No notable events documented.  Last Vitals:  Vitals:   07/25/20 0228 07/25/20 0545  BP: 129/61 135/60  Pulse: 66 66  Resp: 18 18  Temp: 36.6 C 36.4 C  SpO2: 94% 95%    Last Pain:  Vitals:   07/25/20 0545  TempSrc: Oral  PainSc:                  Caren Macadam

## 2020-07-25 NOTE — Progress Notes (Signed)
Brief GI progress note  Pathology has been reviewed this AM.  FINAL MICROSCOPIC DIAGNOSIS:  A. STOMACH, BIOPSY:  -  Chronic active gastritis  -  H. pylori organisms present  -  No intestinal metaplasia identified  -  See comment  COMMENT:  Warthin-Starry stain is POSITIVE for organisms consistent with  Helicobacter pylori.  In the setting of the patient's H. pylori infection, she will benefit from treatment.  If the patient is doing well post procedure, would recommend medicine service initiate 1 of the following antibiotic regimen at time of discharge:  1) Clarithromycin 500 mg BID + Amoxicillin 1000 mg BID + PO PPI BID x 14 days then decrease to PO PPI QD  Or  2) Bismuth 525 mg QID + Flagyl 500 mg BID + Tetracycline 500 mg BID or Doxycycline 100 mg BID + PO PPI BID x 14 days then PO PPI QD   The patient will be followed up in clinic in the coming weeks and we will arrange  H. pylori eradication evaluation after she is seen in follow-up.   Thanks.  Corliss Parish, MD [facility] Office # [phone]

## 2020-07-25 NOTE — Progress Notes (Signed)
1 Day Post-Op  Subjective: CC: Patients daughter at bedside and assisted with translation. Patient with mild soreness around upper abdominal incisions that is well controlled. Only has taken tylenol post op. Tolerating diet without n/v. Mobilizing. Voiding.   Objective: Vital signs in last 24 hours: Temp:  [97.1 F (36.2 C)-98.3 F (36.8 C)] 97.6 F (36.4 C) (06/16 0545) Pulse Rate:  [62-71] 66 (06/16 0545) Resp:  [16-18] 18 (06/16 0545) BP: (112-148)/(48-68) 135/60 (06/16 0545) SpO2:  [94 %-100 %] 95 % (06/16 0545) FiO2 (%):  [21 %] 21 % (06/15 1446) Last BM Date: 07/22/20  Intake/Output from previous day: 06/15 0701 - 06/16 0700 In: 1040 [P.O.:340; I.V.:700] Out: 15 [Blood:15] Intake/Output this shift: No intake/output data recorded.  PE: Gen:  Alert, NAD, pleasant Card:  RRR Pulm:  CTAB, no W/R/R, effort normal Abd: Soft, appropriately tender around laparoscopic incisions, otherwise NT. No peritonitis. +BS. Incisions with glue intact appears well and are without drainage, bleeding, or signs of infection Psych: A&Ox3  Skin: no rashes noted, warm and dry   Lab Results:  Recent Labs    07/24/20 0052 07/25/20 0205  WBC 5.2 9.3  HGB 10.0* 10.3*  HCT 31.1* 31.9*  PLT 163 184   BMET Recent Labs    07/24/20 0052 07/25/20 0205  NA 138 142  K 4.7 4.4  CL 104 106  CO2 24 29  GLUCOSE 119* 162*  BUN 7* 10  CREATININE 1.34* 1.42*  CALCIUM 8.2* 8.0*   PT/INR No results for input(s): LABPROT, INR in the last 72 hours. CMP     Component Value Date/Time   NA 142 07/25/2020 0205   NA 143 01/15/2020 0827   K 4.4 07/25/2020 0205   CL 106 07/25/2020 0205   CO2 29 07/25/2020 0205   GLUCOSE 162 (H) 07/25/2020 0205   BUN 10 07/25/2020 0205   BUN 21 01/15/2020 0827   CREATININE 1.42 (H) 07/25/2020 0205   CREATININE 0.92 09/14/2015 0840   CALCIUM 8.0 (L) 07/25/2020 0205   PROT 5.5 (L) 07/25/2020 0205   PROT 7.3 12/19/2018 0841   ALBUMIN 2.6 (L) 07/25/2020  0205   ALBUMIN 4.4 12/19/2018 0841   AST 59 (H) 07/25/2020 0205   ALT 46 (H) 07/25/2020 0205   ALKPHOS 109 07/25/2020 0205   BILITOT 0.6 07/25/2020 0205   BILITOT 0.4 12/19/2018 0841   GFRNONAA 39 (L) 07/25/2020 0205   GFRNONAA 63 09/14/2015 0840   GFRAA 50 (L) 01/15/2020 0827   GFRAA 73 09/14/2015 0840   Lipase     Component Value Date/Time   LIPASE 63 (H) 07/19/2020 1527       Studies/Results: DG ERCP  Result Date: 07/23/2020 CLINICAL DATA:  75 year old female with repeat ERCP and removal of stent EXAM: ERCP TECHNIQUE: Multiple spot images obtained with the fluoroscopic device and submitted for interpretation post-procedure. FLUOROSCOPY TIME:  Fluoroscopy Time:  5 minutes 5 seconds COMPARISON:  07/20/2020, CT 07/19/2020 FINDINGS: Limited intraoperative fluoroscopic spot images during ERCP. Initial image demonstrates the endoscope projecting over the after abdomen with a plastic biliary stent. Subsequently there is placement of a safety wire and partial opacification of the extrahepatic biliary system. Multiple rounded filling defects present. Subsequent passage of a balloon retrieval catheter and placement of a new plastic biliary stent. IMPRESSION: Limited images during repeat ERCP demonstrates treatment of choledocholithiasis with passage of a balloon retrieval catheter and replacement of plastic biliary stent. Please refer to the dictated operative report for full details of intraoperative findings  and procedure. Electronically Signed   By: Gilmer Mor D.O.   On: 07/23/2020 11:05    Anti-infectives: Anti-infectives (From admission, onward)    Start     Dose/Rate Route Frequency Ordered Stop   07/25/20 0000  clarithromycin (BIAXIN XL) 500 MG 24 hr tablet        500 mg Oral 2 times daily 07/25/20 0941 08/08/20 2359   07/25/20 0000  amoxicillin (AMOXIL) 500 MG capsule        1,000 mg Oral 2 times daily 07/25/20 0941 08/08/20 2359   07/24/20 0600  ceFAZolin (ANCEF) IVPB 2g/100 mL  premix        2 g 200 mL/hr over 30 Minutes Intravenous To Short Stay 07/23/20 1447 07/24/20 1324   07/23/20 0830  ciprofloxacin (CIPRO) IVPB 400 mg        400 mg 200 mL/hr over 60 Minutes Intravenous  Once 07/23/20 0827 07/23/20 0925   07/20/20 1815  piperacillin-tazobactam (ZOSYN) IVPB 3.375 g  Status:  Discontinued        3.375 g 12.5 mL/hr over 240 Minutes Intravenous Every 8 hours 07/20/20 1806 07/22/20 0951        Assessment/Plan POD 1, s/p Laparoscopic Cholecystectomy by Dr. Janee Morn on 6/15 for Choledocholithiasis  - s/p ERCP 6/11 where common bile duct was dilated and choledocholithiasis found; partial removal was accomplished with biliary sphincterotomy, balloon clearing; one plastic stent was placed into the common bile duct - second ERCP 6/14 with further stones removed and new stent placed. - The patient is voiding well, tolerating diet, ambulating well, pain well controlled, vital signs stable, incisions c/d/i and felt stable for discharge home from our standpoint. Will arrange follow up. Will reach out to [facility] to let them know. Discharge instructions and return precautions discussed.  ID - zosyn 6/11>>6/13, cipro 6/14. Ancef periop 6/15. Started on Amoxicillin and Clarithromycin 6/16 for H. Pylori FEN - soft diet VTE - SCDs, per primary  Foley - none   H. Pylori - Bx from stomach w/ H. Pylori. Treatment regimen per GI HTN HLD CKD-IIIa S/p right corneal transplant Anemia    LOS: 6 days    Jacinto Halim , [facility] 07/25/2020, 9:55 AM Please see Amion for pager number during day hours 7:00am-4:30pm

## 2020-07-25 NOTE — Discharge Summary (Signed)
Physician Discharge Summary  Katrin Grabel YTK:[id number] DOB: [birth year] DOA: 07/19/2020  PCP: Shawnee Knapp, MD  Admit date: 07/19/2020 Discharge date: 07/25/2020  Admitted From: Home Disposition: Home   Recommendations for Outpatient Follow-up:  Follow up with PCP in 1-2 weeks, will need to reestablish with [facility].  Follow up with GI in office then repeat ERCP. Initiated H. pylori Tx at DC.  Home Health: None Equipment/Devices: None Discharge Condition: Stable CODE STATUS: Full Diet recommendation: Heart healthy  Brief/Interim Summary: Becky Turner is a 75 y.o. female with a history of HTN, HLD, stage IIIa CKD who presented to the ED 6/10 with near syncopal event and several weeks of epigastric pain with nausea. She was hypotensive and afebrile with LFT elevations, Creatinine elevated to 1.81 above baseline ~1.2, hs Troponin 13 and, WBC 7.8k, hgb 13.3g/dl. CT abd/pelvis revealed marked biliary ductal dilatation and distal CBD stones up to 1.2cm. IVF given and patient was taken to ERCP the following morning where some stones were removed and plastic stent placed. The procedure was made difficult due to papilla location within diverticulum. Hospital stay complicated by hypotension and rising leukocytosis for which zosyn was added. She has remained afebrile with improved blood pressure, and may be developing antibiotic-associated diarrhea, so antibiotics are stopped. Repeat ERCP performed 07/23/2020 with electrohydraulic lithotripsy of 2 obstructing CBD stones and stent replacement. Cholecystectomy was performed 7/51/7001 with uncomplicated post-operative course.   Discharge Diagnoses:  Principal Problem:   Choledocholithiasis Active Problems:   Essential hypertension   Hyperlipidemia, familial, high LDL   Acute kidney injury superimposed on CKD (HCC)   Pain of upper abdomen   Elevated LFTs   Bleeding from sphincterotomy site  Choledocholithiasis with biliary obstruction: s/p partial clearing at  ERCP 6/11 with sphincterotomy, plastic CBD stent placement, s/p lap cholecystectomy 6/15. - Follow up with GI and general surgery - Trend CMP at DC.   Hypotension: Improved. - WBC normalized, zosyn stopped. - Advised to hold antihypertensives at DC until she follows up with Dr. Einar Gip.    Erosive gastropathy with H. pylori: Noted on ERCP. - Biopsy from 6/14 + H. pylori. GI recommends 1) Clarithromycin 500 mg BID + Amoxicillin 1000 mg BID + PO PPI BID x 14 days then decrease to PO PPI QD  - The patient will need to be followed up in clinic in the coming weeks to arrange H. pylori eradication evaluation.     Acute blood loss anemia: Oozing from sphincterotomy site treated with epinephrine. - Hgb stabilized. No bleeding noted.   AKI on stage IIIb CKD: Improved.   Thrombocytopenia: Resolved.   Hypokalemia:  Resolved.   Essential HTN: Holding antihypertensives.   s/p right corneal transplant: - Continue home prednisone gtt   Hyperlipidemia: - Continue crestor. Pt's family has to take off work every time she has praluent infusion, so I recommended she disclose that to Dr. Einar Gip at follow up.    Discharge Instructions Discharge Instructions     Call MD for:  difficulty breathing, headache or visual disturbances   Complete by: As directed    Call MD for:  persistant nausea and vomiting   Complete by: As directed    Call MD for:  redness, tenderness, or signs of infection (pain, swelling, redness, odor or green/yellow discharge around incision site)   Complete by: As directed    Call MD for:  severe uncontrolled pain   Complete by: As directed    Call MD for:  temperature >100.4   Complete by: As  directed    Diet - low sodium heart healthy   Complete by: As directed    Discharge instructions   Complete by: As directed    You were treated for stones in the common bile duct causing obstruction which were treated with ERCP by GI twice. You will still need to follow up with [facility]  (Dr. Rush Landmark) in the coming weeks. Please call their office if you aren't contacted by them by the end of the week.   You had the gallbladder removed on 6/15 and will need to take tylenol as needed for pain and follow up with surgery [facility]).   H. pylori was found on the biopsy of your stomach. This is a bacteria that should be treated to help heal the irritation in the stomach. To treat this, you will take antibiotics (clarithromycin and amoxicillin twice daily) for 14 days. During this time take omeprazole (to suppress acid in the stomach) twice a day for those 14 days, then return to just once a day.   Your blood pressure was low for a while during this hospitalization. This has improved but your blood pressure is still on the lower side of normal. For this reason, please do NOT TAKE metoprolol, lisinopril-HCTZ, or amlodipine until you follow up with Dr. Einar Gip.   No dressing needed   Complete by: As directed       Allergies as of 07/25/2020       Reactions   Aspirin Nausea And Vomiting        Medication List     TAKE these medications    amLODipine 5 MG tablet Commonly known as: NORVASC Take 1 tablet (5 mg total) by mouth daily. HOLD UNTIL CARDIOLOGY FOLLOW UP What changed: See the new instructions.   amoxicillin 500 MG capsule Commonly known as: AMOXIL Take 2 capsules (1,000 mg total) by mouth 2 (two) times daily for 14 days.   clarithromycin 500 MG 24 hr tablet Commonly known as: BIAXIN XL Take 1 tablet (500 mg total) by mouth 2 (two) times daily for 14 days.   lisinopril-hydrochlorothiazide 20-25 MG tablet Commonly known as: ZESTORETIC Take 1 tablet by mouth daily. HOLD UNTIL CARDIOLOGY FOLLOW UP What changed: additional instructions   metoprolol succinate 50 MG 24 hr tablet Commonly known as: TOPROL-XL Take 1 tablet (50 mg total) by mouth daily. HOLD UNTIL CARDIOLOGY FOLLOW UP What changed: additional instructions   omeprazole 20 MG  capsule Commonly known as: PRILOSEC Take 1 capsule (20 mg total) by mouth 2 (two) times daily for 14 days. then return to 53m po daily What changed:  when to take this additional instructions   Praluent 150 MG/ML Soaj Generic drug: Alirocumab Inject 150 mLs as directed every 14 (fourteen) days.   prednisoLONE acetate 1 % ophthalmic suspension Commonly known as: PRED FORTE Place 1 drop into the right eye 4 (four) times daily.   rosuvastatin 10 MG tablet Commonly known as: CRESTOR Take 1 tablet (10 mg total) by mouth every evening.               Discharge Care Instructions  (From admission, onward)           Start     Ordered   07/25/20 0000  No dressing needed        07/25/20 0941            [facility] Follow up in 3 week(s).   Specialty:  General Surgery Why: please follow up on 08/15/20 at 3:45 pm  Please arrive 30 minutes prior to your appointment and bring photo ID and insurance card Contact information: [address] [place]. Call.   Why: for primary care Contact information: [place] [address] [phone]        Mansouraty, Telford Nab., MD Follow up.   Specialties: Gastroenterology, Internal Medicine Why: 4-6 weeks Contact information: [place] [address] [phone]                Allergies  Allergen Reactions   Aspirin Nausea And Vomiting    Consultations: General surgery GI  Procedures/Studies: CT Abdomen Pelvis Wo Contrast  Result Date: 07/19/2020 CLINICAL DATA:  Syncope.  Epigastric abdominal pain.  Constipation. EXAM: CT ABDOMEN AND PELVIS WITHOUT CONTRAST TECHNIQUE: Multidetector CT imaging of the abdomen and pelvis was performed following the standard protocol without IV contrast. COMPARISON:  Chest  radiograph earlier today. 09/14/2015 abdominal plain film. FINDINGS: Lower chest: Motion degradation. Bibasilar atelectasis. Mild cardiomegaly. Hepatobiliary: No gross liver lesion on this noncontrast CT. The gallbladder is distended, without calcified stone or specific evidence of acute cholecystitis. Marked biliary duct dilatation with the common duct measuring 2.4 cm on 31/6. Followed to the level of a distal common duct stone or stones including up to 1.2 cm on coronal image 30 and transverse image 35. Pancreas: Mild upper abdominal motion degradation. No pancreatic duct dilatation or acute inflammation. Spleen: Normal in size, without focal abnormality. Adrenals/Urinary Tract: Normal adrenal glands. Too small to characterize lesions in both kidneys. No renal calculi or hydronephrosis. No hydroureter or ureteric calculi. No bladder calculi. Stomach/Bowel: Normal stomach, without wall thickening. 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 Otherwise normal small bowel. Normal colon, appendix, and terminal ileum. Vascular/Lymphatic: Aortic atherosclerosis. No abdominopelvic adenopathy. Reproductive: Normal uterus and adnexa. Other: No significant free fluid. Mild pelvic floor laxity. No free intraperitoneal air. Tiny fat containing periumbilical hernia. Musculoskeletal: No acute osseous abnormality. IMPRESSION: 1. Marked biliary duct dilatation secondary to a distal common duct stone or stones, on the order of 1.2 cm. 2. Gallbladder distension, without evidence of acute cholecystitis. 3. Mild motion degradation. 4.  Aortic Atherosclerosis (ICD10-I70.0). Electronically Signed   By: Abigail Miyamoto M.D.   On: 07/19/2020 18:07   DG Chest Port 1 View  Result Date: 07/19/2020 CLINICAL DATA:  Abdominal pain and chills, initial encounter EXAM: PORTABLE CHEST 1 VIEW COMPARISON:  11/16/2003 FINDINGS: Cardiac shadow is mildly prominent but stable. Aortic calcifications are again seen. Lungs are clear bilaterally. No  acute bony abnormality is noted. Degenerative change of the thoracic spine is seen. IMPRESSION: No acute abnormality noted. Electronically Signed   By: Inez Catalina M.D.   On: 07/19/2020 16:27   DG ERCP  Result Date: 07/23/2020 CLINICAL DATA:  75 year old female with repeat ERCP and removal of stent EXAM: ERCP TECHNIQUE: Multiple spot images obtained with the fluoroscopic device and submitted for interpretation post-procedure. FLUOROSCOPY TIME:  Fluoroscopy Time:  5 minutes 5 seconds COMPARISON:  07/20/2020, CT 07/19/2020 FINDINGS: Limited intraoperative fluoroscopic spot images during ERCP. Initial image demonstrates the endoscope projecting over the after abdomen with a plastic biliary stent. Subsequently there is placement of a safety wire and partial opacification of the extrahepatic biliary system. Multiple rounded filling defects present. Subsequent passage of a balloon retrieval catheter and placement of a new  plastic biliary stent. IMPRESSION: Limited images during repeat ERCP demonstrates treatment of choledocholithiasis with passage of a balloon retrieval catheter and replacement of plastic biliary stent. Please refer to the dictated operative report for full details of intraoperative findings and procedure. Electronically Signed   By: Corrie Mckusick D.O.   On: 07/23/2020 11:05   DG ERCP BILIARY & PANCREATIC DUCTS  Result Date: 07/20/2020 CLINICAL DATA:  75 year old female with a history of biliary ductal dilatation EXAM: ERCP TECHNIQUE: Multiple spot images obtained with the fluoroscopic device and submitted for interpretation post-procedure. FLUOROSCOPY TIME:  Fluoroscopy Time:  4 minutes 11 seconds COMPARISON:  CT 07/19/2020 FINDINGS: Limited intraoperative fluoroscopic spot images during ERCP. Initial image demonstrates the endoscope projecting over the upper abdomen. Subsequently there is cannulation of the ampulla with retrograde infusion of contrast. Final image demonstrates placement of a  plastic biliary stent. The ill-defined filling defects in the ductal system just above the ampulla may represent air bubbles or debris/stones. IMPRESSION: Limited images during ERCP demonstrates findings as above, with placement of plastic biliary stent. Signed, Dulcy Fanny. Dellia Nims, RPVI [facility] Electronically Signed   By: Corrie Mckusick D.O.   On: 07/20/2020 13:08    Laparoscopic cholecystectomy 07/24/2020  07/23/2020 ERCP Dr. Rush Landmark: Impression:       - Erosive gastropathy with no bleeding and no                           stigmata of recent bleeding. Biopsied for HP.                           - No gross lesions in the duodenal bulb, in the                           first portion of the duodenum and in the second                           portion of the duodenum.                           - The major papilla was on the rim of a                           diverticulum. Prior biliary sphincterotomy appeared                           open.                           - One visibly patent stent from the biliary tree                           was seen in the major papilla. This was removed.                           - The entire main bile duct was severely dilated.                           - Choledocholithiasis was found. Complete  incomplete initial removal with sweeping.                           - Cholangioscopy performed. Abnormal biliary tract                           mucosa was found noted in the cystic duct                           (low-lying insertion).                           - Filling defects consistent with a stone and                           sludge were seen on cholangioscopy.                           - Electohydraulic Lithotripsy was successful to 2                           large stones.                           - The biliary tree was swept.                           - One plastic  biliary stent was placed into the                           common bile duct to ensure that the small stone                           fragments from significant EHL would not lead to                           collection distally in this very large biliary duct                           leading to occlusion. This will be able to                           hopefully decompress the biliary system over the                           coming weeks and eventually do a completion clear                           out after cholecystectomy.   Recommendation: - The patient will be observed post-procedure,                           until all discharge criteria are met.                           - Return patient to hospital ward  for ongoing care.                           - Check liver enzymes (AST, ALT, alkaline                           phosphatase, bilirubin) in the morning.                           - Watch for pancreatitis, bleeding, perforation,                           and cholangitis.                           - Await path results.                           - Start Protonix 40 mg daily.                           - Proceed to cholecystectomy during this admission                           as per surgical service (hopefully tomorrow).                           - Repeat ERCP in 3 months to remove stent.                           - The findings and recommendations were discussed with the patient.                           - The findings and recommendations were discussed with the patient's family.   07/20/2020 ERCP Dr. Fuller Plan: Impression:        - The major papilla was located partially within a                           diverticulum.                           - The common bile duct was dilated, with stones                           causing an obstruction.                           - Choledocholithiasis was found. Partial removal                           was accomplished with biliary  sphincterotomy,                           balloon clearing; a biliary stent was inserted.                           -  A biliary sphincterotomy was performed.                           - The biliary tree was swept.                           - One plastic stent was placed into the common bile                           duct. Recommendation: - Avoid aspirin and nonsteroidal anti-inflammatory                           medicines for 1 week.                           - Clear liquid diet today.                           - Return patient to hospital ward for ongoing care.                           - Observe patient's clinical course following                           today's ERCP with therapeutic intervention.                           - Discuss with Dr. Rush Landmark regarding repeat ERCP in the near future to remove the remaining stones and biliary stent.  Subjective: Feels well, tolerating diet, ambulating, wants to go home.   Discharge Exam: Vitals:   07/25/20 0228 07/25/20 0545  BP: 129/61 135/60  Pulse: 66 66  Resp: 18 18  Temp: 97.8 F (36.6 C) 97.6 F (36.4 C)  SpO2: 94% 95%   General: Pt is alert, awake, not in acute distress Cardiovascular: RRR, S1/S2 +, no rubs, no gallops Respiratory: CTA bilaterally, no wheezing, no rhonchi Abdominal: Soft, NT, ND, bowel sounds + Extremities: No edema, no cyanosis  Labs: BNP (last 3 results) No results for input(s): BNP in the last 8760 hours. Basic Metabolic Panel: Recent Labs  Lab 07/21/20 0146 07/22/20 0319 07/23/20 0333 07/24/20 0052 07/25/20 0205  NA 135 140 140 138 142  K 3.3* 3.9 3.3* 4.7 4.4  CL 102 107 106 104 106  CO2 '27 28 28 24 29  ' GLUCOSE 107* 103* 89 119* 162*  BUN 12 9 5* 7* 10  CREATININE 1.50* 1.49* 1.20* 1.34* 1.42*  CALCIUM 7.6* 8.0* 8.3* 8.2* 8.0*   Liver Function Tests: Recent Labs  Lab 07/21/20 0146 07/22/20 0319 07/23/20 0333 07/24/20 0052 07/25/20 0205  AST 72* '30 21 18 ' 59*  ALT 94* 63* 48* 41  46*  ALKPHOS 184* 147* 128* 120 109  BILITOT 0.8 0.8 0.6 0.7 0.6  PROT 4.7* 4.9* 5.1* 5.5* 5.5*  ALBUMIN 2.1* 2.2* 2.4* 2.6* 2.6*   Recent Labs  Lab 07/19/20 1527  LIPASE 63*   No results for input(s): AMMONIA in the last 168 hours. CBC: Recent Labs  Lab 07/19/20 1527 07/20/20 0225 07/21/20 0146 07/21/20 1510 07/22/20 0319 07/23/20 0333 07/24/20 0052 07/25/20 0205  WBC 7.8   < >  7.8  --  7.2 5.0 5.2 9.3  NEUTROABS 6.9  --   --   --   --   --   --   --   HGB 13.3   < > 8.9* 10.5* 9.7* 10.0* 10.0* 10.3*  HCT 41.2   < > 26.9* 32.5* 29.3* 31.5* 31.1* 31.9*  MCV 89.8   < > 87.1  --  87.7 88.2 87.6 88.1  PLT 231   < > 128*  --  155 165 163 184   < > = values in this interval not displayed.   Cardiac Enzymes: No results for input(s): CKTOTAL, CKMB, CKMBINDEX, TROPONINI in the last 168 hours. BNP: Invalid input(s): POCBNP CBG: No results for input(s): GLUCAP in the last 168 hours. D-Dimer No results for input(s): DDIMER in the last 72 hours. Hgb A1c No results for input(s): HGBA1C in the last 72 hours. Lipid Profile No results for input(s): CHOL, HDL, LDLCALC, TRIG, CHOLHDL, LDLDIRECT in the last 72 hours. Thyroid function studies No results for input(s): TSH, T4TOTAL, T3FREE, THYROIDAB in the last 72 hours.  Invalid input(s): FREET3 Anemia work up No results for input(s): VITAMINB12, FOLATE, FERRITIN, TIBC, IRON, RETICCTPCT in the last 72 hours. Urinalysis    Component Value Date/Time   COLORURINE YELLOW 07/19/2020 1527   APPEARANCEUR CLEAR 07/19/2020 1527   LABSPEC 1.008 07/19/2020 1527   PHURINE 5.0 07/19/2020 1527   GLUCOSEU NEGATIVE 07/19/2020 1527   HGBUR SMALL (A) 07/19/2020 1527   BILIRUBINUR NEGATIVE 07/19/2020 1527   BILIRUBINUR negative 09/14/2015 0856   KETONESUR 20 (A) 07/19/2020 1527   PROTEINUR NEGATIVE 07/19/2020 1527   UROBILINOGEN 0.2 09/14/2015 0856   NITRITE NEGATIVE 07/19/2020 1527   LEUKOCYTESUR NEGATIVE 07/19/2020 1527     Microbiology Recent Results (from the past 240 hour(s))  Resp Panel by RT-PCR (Flu A&B, Covid) Nasopharyngeal Swab     Status: None   Collection Time: 07/19/20  8:20 PM   Specimen: Nasopharyngeal Swab; Nasopharyngeal(NP) swabs in vial transport medium  Result Value Ref Range Status   SARS Coronavirus 2 by RT PCR NEGATIVE NEGATIVE Final    Comment: (NOTE) SARS-CoV-2 target nucleic acids are NOT DETECTED.  The SARS-CoV-2 RNA is generally detectable in upper respiratory specimens during the acute phase of infection. The lowest concentration of SARS-CoV-2 viral copies this assay can detect is 138 copies/mL. A negative result does not preclude SARS-Cov-2 infection and should not be used as the sole basis for treatment or other patient management decisions. A negative result may occur with  improper specimen collection/handling, submission of specimen other than nasopharyngeal swab, presence of viral mutation(s) within the areas targeted by this assay, and inadequate number of viral copies(<138 copies/mL). A negative result must be combined with clinical observations, patient history, and epidemiological information. The expected result is Negative.  Fact Sheet for Patients:  EntrepreneurPulse.com.au  Fact Sheet for Healthcare Providers:  IncredibleEmployment.be  This test is no t yet approved or cleared by the [place] FDA and  has been authorized for detection and/or diagnosis of SARS-CoV-2 by FDA under an Emergency Use Authorization (EUA). This EUA will remain  in effect (meaning this test can be used) for the duration of the COVID-19 declaration under Section 564(b)(1) of the Act, 21 U.S.C.section 360bbb-3(b)(1), unless the authorization is terminated  or revoked sooner.       Influenza A by PCR NEGATIVE NEGATIVE Final   Influenza B by PCR NEGATIVE NEGATIVE Final    Comment: (NOTE) The Xpert Xpress SARS-CoV-2/FLU/RSV plus  assay is  intended as an aid in the diagnosis of influenza from Nasopharyngeal swab specimens and should not be used as a sole basis for treatment. Nasal washings and aspirates are unacceptable for Xpert Xpress SARS-CoV-2/FLU/RSV testing.  Fact Sheet for Patients: EntrepreneurPulse.com.au  Fact Sheet for Healthcare Providers: IncredibleEmployment.be  This test is not yet approved or cleared by the [place] FDA and has been authorized for detection and/or diagnosis of SARS-CoV-2 by FDA under an Emergency Use Authorization (EUA). This EUA will remain in effect (meaning this test can be used) for the duration of the COVID-19 declaration under Section 564(b)(1) of the Act, 21 U.S.C. section 360bbb-3(b)(1), unless the authorization is terminated or revoked.  Performed at [facility], [place] [address] [place], [place] [address]   Surgical pcr screen     Status: None   Collection Time: 07/23/20 10:27 PM   Specimen: Nasal Mucosa; Nasal Swab  Result Value Ref Range Status   MRSA, PCR NEGATIVE NEGATIVE Final   Staphylococcus aureus NEGATIVE NEGATIVE Final    Comment: (NOTE) The Xpert SA Assay (FDA approved for NASAL specimens in patients 77 years of age and older), is one component of a comprehensive surveillance program. It is not intended to diagnose infection nor to guide or monitor treatment. Performed at [facility], [place] [address] [place], [place] [address]     Time coordinating discharge: Approximately 40 minutes  Patrecia Pour, MD  [facility] 07/25/2020, 6:01 PM

## 2020-07-26 ENCOUNTER — Telehealth: Payer: Self-pay | Admitting: Gastroenterology

## 2020-07-26 ENCOUNTER — Other Ambulatory Visit: Payer: Self-pay

## 2020-07-26 LAB — SURGICAL PATHOLOGY

## 2020-07-26 MED ORDER — PANTOPRAZOLE SODIUM 40 MG PO TBEC: DELAYED_RELEASE_TABLET | ORAL | 0 refills | Status: DC

## 2020-07-26 MED ORDER — PANTOPRAZOLE SODIUM 40 MG PO TBEC: 40.0000 mg | DELAYED_RELEASE_TABLET | Freq: Every day | ORAL | 3 refills | Status: DC

## 2020-07-26 NOTE — Telephone Encounter (Signed)
Dr. Meridee Score please see note below and advise regarding PPI.

## 2020-07-26 NOTE — Telephone Encounter (Signed)
Thank you for reaching out.  I am fine with any of the PPIs that are covered by her insurance be at Protonix or Nexium or Dexilant.  I would do an equivalent dose for Protonix or Nexium as to what she was discharged on.  If Dexilant is covered then 30 mg is fine.  Thanks.

## 2020-07-26 NOTE — Telephone Encounter (Signed)
Pt's daughter [name] called stating that pt's insurance does not cover Omeprazole, she called the hospital were pt was hospitalized to speak with Dr. Jarvis Newcomer, who prescribed medication but she was told that Dr. Jarvis Newcomer last day was yesterday so he no longer works there and direct her to [place] since Dr. Meridee Score performed ERCP. She is asking if we could help her getting a different medication. Pls call her.

## 2020-07-26 NOTE — Anesthesia Postprocedure Evaluation (Signed)
Anesthesia Post Note  Patient: Becky Turner  Procedure(s) Performed: LAPAROSCOPIC CHOLECYSTECTOMY     Patient location during evaluation: PACU Anesthesia Type: General Level of consciousness: awake Pain management: pain level controlled Vital Signs Assessment: post-procedure vital signs reviewed and stable Respiratory status: spontaneous breathing Cardiovascular status: stable Postop Assessment: no apparent nausea or vomiting Anesthetic complications: no   No notable events documented.  Last Vitals:  Vitals:   07/25/20 0228 07/25/20 0545  BP: 129/61 135/60  Pulse: 66 66  Resp: 18 18  Temp: 36.6 C 36.4 C  SpO2: 94% 95%    Last Pain:  Vitals:   07/25/20 0949  TempSrc:   PainSc: 2                  Danuta Huseman

## 2020-07-26 NOTE — Telephone Encounter (Signed)
New script sent to the pharmacy and pts daughter aware.

## 2020-07-29 ENCOUNTER — Telehealth: Payer: Self-pay | Admitting: Gastroenterology

## 2020-07-29 ENCOUNTER — Ambulatory Visit: Payer: Medicare Other

## 2020-07-29 NOTE — Telephone Encounter (Signed)
Spoke with pts daughter and she is aware pt should take protonix bid for 14 days and then daily.

## 2020-07-29 NOTE — Telephone Encounter (Signed)
Pls call pt's daughter. She needs clarification on instructions for Pantoprazole. She was told 1 pill/day. However, instructions on the container said 1 pill BID.

## 2020-08-06 ENCOUNTER — Telehealth: Payer: Self-pay | Admitting: Gastroenterology

## 2020-08-06 NOTE — Telephone Encounter (Signed)
Patient's daughter called to let us know that the patient has been taking antibiotics and has developed a rash in two areas of her body and is seeking advise.

## 2020-08-06 NOTE — Telephone Encounter (Signed)
The pt's daughter states that the patient began to have a rash after 9 days of abx for H pylori.  She says it is under her armpits, groin area and forearms.  It is red and itchy.  She only has 1 day left of abx so she was told to not take anymore and take benadryl and call her PCP if the rash does not improve.  She will keep the appt that has already been scheduled for 8/2.  FYI Dr Meridee Score

## 2020-08-07 DIAGNOSIS — H35362 Drusen (degenerative) of macula, left eye: Secondary | ICD-10-CM | POA: Diagnosis not present

## 2020-08-07 DIAGNOSIS — Z961 Presence of intraocular lens: Secondary | ICD-10-CM | POA: Diagnosis not present

## 2020-08-07 DIAGNOSIS — H43313 Vitreous membranes and strands, bilateral: Secondary | ICD-10-CM | POA: Diagnosis not present

## 2020-08-07 DIAGNOSIS — Z947 Corneal transplant status: Secondary | ICD-10-CM | POA: Diagnosis not present

## 2020-08-13 NOTE — Telephone Encounter (Signed)
Thank you for the update. I am sorry to hear about this. Can you please check in with her at some point this week to see if things have improved? For now continue to keep the appointment that has been scheduled. Thanks. GM

## 2020-08-18 ENCOUNTER — Other Ambulatory Visit: Payer: Self-pay | Admitting: Gastroenterology

## 2020-08-19 ENCOUNTER — Other Ambulatory Visit: Payer: Self-pay

## 2020-08-19 ENCOUNTER — Ambulatory Visit: Payer: Medicare Other | Admitting: Physician Assistant

## 2020-09-09 ENCOUNTER — Encounter: Payer: Self-pay | Admitting: Family Medicine

## 2020-09-09 ENCOUNTER — Ambulatory Visit (INDEPENDENT_AMBULATORY_CARE_PROVIDER_SITE_OTHER): Payer: Medicare Other | Admitting: Family Medicine

## 2020-09-09 ENCOUNTER — Other Ambulatory Visit: Payer: Self-pay

## 2020-09-09 VITALS — BP 134/80 | HR 78 | Temp 98.0°F | Ht 60.05 in | Wt 103.2 lb

## 2020-09-09 DIAGNOSIS — N179 Acute kidney failure, unspecified: Secondary | ICD-10-CM

## 2020-09-09 DIAGNOSIS — I10 Essential (primary) hypertension: Secondary | ICD-10-CM

## 2020-09-09 DIAGNOSIS — Z23 Encounter for immunization: Secondary | ICD-10-CM | POA: Diagnosis not present

## 2020-09-09 DIAGNOSIS — E7849 Other hyperlipidemia: Secondary | ICD-10-CM | POA: Diagnosis not present

## 2020-09-09 DIAGNOSIS — N189 Chronic kidney disease, unspecified: Secondary | ICD-10-CM

## 2020-09-09 DIAGNOSIS — R739 Hyperglycemia, unspecified: Secondary | ICD-10-CM | POA: Diagnosis not present

## 2020-09-09 DIAGNOSIS — Z947 Corneal transplant status: Secondary | ICD-10-CM | POA: Insufficient documentation

## 2020-09-09 DIAGNOSIS — R7989 Other specified abnormal findings of blood chemistry: Secondary | ICD-10-CM

## 2020-09-09 DIAGNOSIS — Z13820 Encounter for screening for osteoporosis: Secondary | ICD-10-CM | POA: Diagnosis not present

## 2020-09-09 DIAGNOSIS — Z780 Asymptomatic menopausal state: Secondary | ICD-10-CM | POA: Diagnosis not present

## 2020-09-09 DIAGNOSIS — R5383 Other fatigue: Secondary | ICD-10-CM | POA: Diagnosis not present

## 2020-09-09 LAB — CBC
HCT: 34.3 % — ABNORMAL LOW (ref 36.0–46.0)
Hemoglobin: 11.1 g/dL — ABNORMAL LOW (ref 12.0–15.0)
MCHC: 32.5 g/dL (ref 30.0–36.0)
MCV: 88.8 fl (ref 78.0–100.0)
Platelets: 198 10*3/uL (ref 150.0–400.0)
RBC: 3.86 Mil/uL — ABNORMAL LOW (ref 3.87–5.11)
RDW: 14.3 % (ref 11.5–15.5)
WBC: 5.8 10*3/uL (ref 4.0–10.5)

## 2020-09-09 LAB — COMPREHENSIVE METABOLIC PANEL
ALT: 21 U/L (ref 0–35)
AST: 22 U/L (ref 0–37)
Albumin: 4.3 g/dL (ref 3.5–5.2)
Alkaline Phosphatase: 39 U/L (ref 39–117)
BUN: 18 mg/dL (ref 6–23)
CO2: 26 mEq/L (ref 19–32)
Calcium: 9.2 mg/dL (ref 8.4–10.5)
Chloride: 105 mEq/L (ref 96–112)
Creatinine, Ser: 1.01 mg/dL (ref 0.40–1.20)
GFR: 54.44 mL/min — ABNORMAL LOW (ref >60.00)
Glucose, Bld: 99 mg/dL (ref 70–99)
Potassium: 3.8 mEq/L (ref 3.5–5.1)
Sodium: 140 mEq/L (ref 135–145)
Total Bilirubin: 0.7 mg/dL (ref 0.2–1.2)
Total Protein: 7.4 g/dL (ref 6.0–8.3)

## 2020-09-09 LAB — LIPID PANEL
Cholesterol: 225 mg/dL — ABNORMAL HIGH (ref 0–200)
HDL: 70.3 mg/dL (ref >39.00)
LDL Cholesterol: 135 mg/dL — ABNORMAL HIGH (ref 0–99)
NonHDL: 155.16
Total CHOL/HDL Ratio: 3
Triglycerides: 99 mg/dL (ref 0.0–149.0)
VLDL: 19.8 mg/dL (ref 0.0–40.0)

## 2020-09-09 LAB — HEMOGLOBIN A1C: Hgb A1c MFr Bld: 5.8 % (ref 4.6–6.5)

## 2020-09-09 LAB — TSH: TSH: 2.48 u[IU]/mL (ref 0.35–5.50)

## 2020-09-09 LAB — GLUCOSE, RANDOM: Glucose, Bld: 99 mg/dL (ref 70–99)

## 2020-09-09 MED ORDER — LISINOPRIL-HYDROCHLOROTHIAZIDE 20-25 MG PO TABS: 1.0000 | ORAL_TABLET | Freq: Every day | ORAL | Status: DC

## 2020-09-09 MED ORDER — ROSUVASTATIN CALCIUM 20 MG PO TABS: 20.0000 mg | ORAL_TABLET | Freq: Every evening | ORAL | 3 refills | Status: DC

## 2020-09-09 NOTE — Addendum Note (Signed)
Addended by: Loyola Mast on: 09/09/2020 05:01 PM   Modules accepted: Orders

## 2020-09-09 NOTE — Progress Notes (Signed)
[name] [address] [place] [address] Dept: [phone] Dept Fax: [phone]  New Patient Office Visit  Subjective:    Patient ID: Becky Turner, female    DOB: January 21, 1946, 75 y.o..   MRN: [id number]  Chief Complaint  Patient presents with   Establish Care    NP- establish care.      History of Present Illness:  Patient is in today to establish care. Becky Turner is originally from [place]. She moved to the [place] about 31 years ago. She is accompanied by her daughter, Becky Turner, who it was [name]'s preference to provide interpretive services (the medical interpreter arranged for the visit was excused). Becky Turner is currently separated from her husband. She has three children: a son in [place], a daughter she lives with, and the daughter with her today. She is retired from being a Location manager. She does not use tobacco, alcohol, or drugs.  Becky Turner has an extended history related to gallstones, with repeated ERCP and stone removal. She had a cholecystectomy in June 2022. She is recovering well from this, but continues to have some fatigue.  Becky Turner has a history of hypertension. She was previously managed on Zestoretic (lisinopril/HCTZ), amlodipine, and Toprol XL. These medications were stopped in the hospital as she was experiencing a low blood pressure at the time. She has held onto her medicine bottles.  Becky Turner has a history of hyperlipidemia. She was previously on Praluent, but apparently this was stopped. She is currently on Crestor 10 mg daily.  Becky Turner was noted to have an elevated blood sugar on multiple occasions during her hospitalization related to her gallbladder. She has not had evaluation for possible diabetes. Additionally, she had a normocytic anemia, and evidence of CKD throughout the time she was in the hospital.  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   Elevated LFTs    Acute kidney injury superimposed on CKD (HCC) 07/19/2020   Hyperlipidemia, familial, high LDL 01/30/2019   Xanthelasma of eyelid 09/20/2015   Essential hypertension 09/10/2015   Dizziness and giddiness 09/10/2015   Insomnia 09/10/2015   Elevated blood sugar 09/10/2015   Past Surgical History:  Procedure Laterality Date   BILIARY STENT PLACEMENT  07/20/2020   Procedure: BILIARY STENT PLACEMENT;  Surgeon: Meryl Dare, MD;  Location: [facility] ENDOSCOPY;  Service: Endoscopy;;   BILIARY STENT PLACEMENT  07/23/2020   Procedure: BILIARY STENT PLACEMENT;  Surgeon: Lemar Lofty., MD;  Location: [facility] ENDOSCOPY;  Service: Gastroenterology;;   BIOPSY  07/23/2020   Procedure: BIOPSY;  Surgeon: Lemar Lofty., MD;  Location: [facility] ENDOSCOPY;  Service: Gastroenterology;;   CATARACT EXTRACTION, BILATERAL  2019   CHOLECYSTECTOMY N/A 07/24/2020   Procedure: LAPAROSCOPIC CHOLECYSTECTOMY;  Surgeon: Violeta Gelinas, MD;  Location: [facility] OR;  Service: General;  Laterality: N/A;   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   ENDOSCOPIC RETROGRADE CHOLANGIOPANCREATOGRAPHY (ERCP) WITH PROPOFOL N/A 07/23/2020   Procedure: ENDOSCOPIC RETROGRADE CHOLANGIOPANCREATOGRAPHY (ERCP) WITH PROPOFOL;  Surgeon: Lemar Lofty., MD;  Location: [facility] ENDOSCOPY;  Service: Gastroenterology;  Laterality: N/A;   ERCP N/A 07/20/2020   Procedure: ENDOSCOPIC RETROGRADE CHOLANGIOPANCREATOGRAPHY (ERCP);  Surgeon: Meryl Dare, MD;  Location: [facility] ENDOSCOPY;  Service: Endoscopy;  Laterality: N/A;   HEMOSTASIS CONTROL  07/20/2020   Procedure: HEMOSTASIS CONTROL;  Surgeon: Meryl Dare, MD;  Location: [facility] ENDOSCOPY;  Service: Endoscopy;;   REMOVAL OF STONES  07/20/2020   Procedure: REMOVAL OF STONES;  Surgeon: Meryl Dare, MD;  Location: [facility] ENDOSCOPY;  Service: Endoscopy;;   REMOVAL OF STONES  07/23/2020   Procedure: REMOVAL OF STONES;  Surgeon: Lemar Lofty., MD;  Location:  [facility] ENDOSCOPY;  Service: Gastroenterology;;   Becky Turner  07/20/2020   Procedure: SPHINCTEROTOMY;  Surgeon: Meryl Dare, MD;  Location: [facility] ENDOSCOPY;  Service: Endoscopy;;   SPYGLASS CHOLANGIOSCOPY N/A 07/23/2020   Procedure: PJASNKNL CHOLANGIOSCOPY;  Surgeon: Lemar Lofty., MD;  Location: [facility] ENDOSCOPY;  Service: Gastroenterology;  Laterality: N/A;   SPYGLASS LITHOTRIPSY N/A 07/23/2020   Procedure: ZJQBHALP LITHOTRIPSY;  Surgeon: Lemar Lofty., MD;  Location: [facility] ENDOSCOPY;  Service: Gastroenterology;  Laterality: N/A;   STENT REMOVAL  07/23/2020   Procedure: STENT REMOVAL;  Surgeon: Lemar Lofty., MD;  Location: [facility] ENDOSCOPY;  Service: Gastroenterology;;   Family History  Problem Relation Age of Onset   Hypertension Mother    Hypertension Father    Diabetes Sister    Gallstones Other    Other Neg Hx    Outpatient Medications Prior to Visit  Medication Sig Dispense Refill   Multiple Vitamins-Minerals (MULTIVITAMIN WITH MINERALS) tablet Take 1 tablet by mouth daily.     prednisoLONE acetate (PRED FORTE) 1 % ophthalmic suspension Place 1 drop into the right eye 4 (four) times daily.     Probiotic Product (CULTURELLE PROBIOTICS PO) Take by mouth.     rosuvastatin (CRESTOR) 10 MG tablet Take 1 tablet (10 mg total) by mouth every evening.     PRALUENT 150 MG/ML SOAJ Inject 150 mLs as directed every 14 (fourteen) days. 6 mL 1   amLODipine (NORVASC) 5 MG tablet Take 1 tablet (5 mg total) by mouth daily. HOLD UNTIL CARDIOLOGY FOLLOW UP (Patient not taking: Reported on 09/09/2020)     lisinopril-hydrochlorothiazide (ZESTORETIC) 20-25 MG tablet Take 1 tablet by mouth daily. HOLD UNTIL CARDIOLOGY FOLLOW UP (Patient not taking: Reported on 09/09/2020)     metoprolol succinate (TOPROL-XL) 50 MG 24 hr tablet Take 1 tablet (50 mg total) by mouth daily. HOLD UNTIL CARDIOLOGY FOLLOW UP (Patient not taking: Reported on 09/09/2020)     pantoprazole (PROTONIX) 40 MG tablet Take  1 tablet (40 mg total) by mouth daily. TAKE 1 TABLET BY MOUTH TWICE A DAY FOR 14 DAYS AND THEN TAKE 1 TABLET DAILY (Patient not taking: Reported on 09/09/2020) 90 tablet 1   No facility-administered medications prior to visit.   Allergies  Allergen Reactions   Aspirin Nausea And Vomiting   Amoxicillin Rash   Clarithromycin Rash   Objective:   Today's Vitals   09/09/20 1000  BP: 134/80  Pulse: 78  Temp: 98 F (36.7 C)  TempSrc: Temporal  SpO2: 98%  Weight: 103 lb 3.2 oz (46.8 kg)  Height: 5' 0.05" (1.525 m)   Body mass index is 20.12 kg/m.   General: Well developed, well nourished. No acute distress. Extremities: Pulses 2+. No edema noted. Skin intact. Psych: Alert and oriented. Normal mood and affect.  Health Maintenance Due  Topic Date Due   TETANUS/TDAP  Never done   COLONOSCOPY (Pts 45-75yrs Insurance coverage will need to be confirmed)  Never done   Zoster Vaccines- Shingrix (1 of 2) Never done   DEXA SCAN  Never done   PNA vac Low Risk Adult (1 of 2 - PCV13) 02/25/2010   COVID-19 Vaccine (4 - Booster for Pfizer series) 03/17/2020   INFLUENZA VACCINE  09/09/2020   Lab results:  Lab Results  Component Value Date   WBC 9.3 07/25/2020   HGB 10.3 (L) 07/25/2020   HCT 31.9 (L) 07/25/2020   MCV 88.1 07/25/2020   PLT 184 07/25/2020   BMP Latest Ref Rng & Units 07/25/2020 07/24/2020 07/23/2020  Glucose 70 - 99 mg/dL 409(W) 119(J) 89  BUN 8 - 23 mg/dL 10 7(L) 5(L)  Creatinine 0.44 - 1.00 mg/dL 4.78(G) 9.56(O) 1.30(Q)  BUN/Creat Ratio 12 - 28 - - -  Sodium 135 - 145 mmol/L 142 138 140  Potassium 3.5 - 5.1 mmol/L 4.4 4.7 3.3(L)  Chloride 98 - 111 mmol/L 106 104 106  CO2 22 - 32 mmol/L 29 24 28   Calcium 8.9 - 10.3 mg/dL 8.0(L) 8.2(L) 8.3(L)   Lab Results  Component Value Date   CHOL 156 01/29/2020   HDL 65 01/29/2020   LDLCALC 62 01/29/2020   TRIG 178 (H) 01/29/2020   CHOLHDL 8.3 (H) 09/20/2015   Lab Results  Component Value Date   HGBA1C 6.6 (H) 12/19/2018    Assessment & Plan:   1. Essential hypertension Ms. Olivero's blood pressure is mildly elevated today. I will restart her on the Zestoretic, but continue to hold off on amlodipine and metoprolol.  - lisinopril-hydrochlorothiazide (ZESTORETIC) 20-25 MG tablet; Take 1 tablet by mouth daily.  2. Acute kidney injury superimposed on CKD (HCC) Review of labs from hospitalization shows some decreased GFR. I will reassess her renal function. I will also reassess her CBC, in light of prior normocytic anemia.  - Comprehensive metabolic panel - CBC  3. Elevated blood sugar Ms. Lashway had multiple elevated blood sugars during her last hospitalization. I will cehck a HbA1c and blood sugar to screen for diabetes.  - Glucose, random - Hemoglobin A1c  4. Hyperlipidemia, familial, high LDL We will reassess her lipids to determine adequacy of her Crestor therapy.  - Lipid panel  5. Elevated LFTs During her hospitalization, she had elevated LFTs, likely due to bilary obstruction from her gallstones. I will reassess to make sure this has resolved.  - Comprehensive metabolic panel  6. Fatigue, unspecified type In addition to her CMP and CBC, I will check a TSH in evaluating fatigue.  - TSH  7. Screening for osteoporosis 8. Asymptomatic menopausal state   - DG Bone Density; Future  9. Need for pneumococcal vaccination  - Pneumococcal conjugate vaccine 20-valent (Prevnar 20)    13/10/2018, MD

## 2020-09-10 ENCOUNTER — Ambulatory Visit (INDEPENDENT_AMBULATORY_CARE_PROVIDER_SITE_OTHER): Payer: Medicare Other | Admitting: Gastroenterology

## 2020-09-10 ENCOUNTER — Encounter: Payer: Self-pay | Admitting: Gastroenterology

## 2020-09-10 ENCOUNTER — Other Ambulatory Visit: Payer: Medicare Other

## 2020-09-10 VITALS — BP 150/70 | HR 68 | Ht 60.5 in | Wt 102.1 lb

## 2020-09-10 DIAGNOSIS — Z8619 Personal history of other infectious and parasitic diseases: Secondary | ICD-10-CM

## 2020-09-10 DIAGNOSIS — Z1211 Encounter for screening for malignant neoplasm of colon: Secondary | ICD-10-CM

## 2020-09-10 DIAGNOSIS — K8050 Calculus of bile duct without cholangitis or cholecystitis without obstruction: Secondary | ICD-10-CM | POA: Diagnosis not present

## 2020-09-10 DIAGNOSIS — T85590A Other mechanical complication of bile duct prosthesis, initial encounter: Secondary | ICD-10-CM

## 2020-09-10 DIAGNOSIS — Z9889 Other specified postprocedural states: Secondary | ICD-10-CM

## 2020-09-10 MED ORDER — PANTOPRAZOLE SODIUM 20 MG PO TBEC: 20.0000 mg | DELAYED_RELEASE_TABLET | Freq: Every day | ORAL | 2 refills | Status: DC

## 2020-09-10 NOTE — Progress Notes (Signed)
[place] VISIT   Primary Care Provider Haydee Salter, MD [place] [address] [phone]  Patient Profile: Becky Turner is a 75 y.o. female with a pmh significant for hypertension, hyperlipidemia, status post cholecystectomy (cholelithiasis requiring ERCP and stenting due to extensive stone burden), H. pylori gastritis (eradication pending).  The patient presents to the [facility] for an evaluation and management of problem(s) noted below:  Problem List 1. Choledocholithiasis   2. History of ERCP   3. History of biliary duct stent placement   4. History of Helicobacter infection   5. Colon cancer screening     History of Present Illness This is a patient that my partner Dr. Fuller Plan met back in June when she presented with symptomatic choledocholithiasis.  She underwent an ERCP with stenting due to extensive stone burden.  I repeated an ERCP during that hospitalization with EHL and stone fragmentation.  Significant stone burden and fragmentation led me to leave a plastic stent in place.  She underwent a cholecystectomy during hospitalization without complication.  She was found to have H. pylori gastritis after gastric biopsies were obtained on her second ERCP due to significant inflammation and she was treated with amoxicillin/clarithromycin/PPI but during her treatment she developed a rash with potential hives and she stopped therapy sooner than the completion of 10 days.  Patient has been seen in follow-up by surgery as well as with her PCP.  Today, the patient comes in for follow-up.  Overall, the patient has been doing better than when she went into the hospital she has discomfort in the areas of her umbilical laparoscopic scar.  She occasionally is having hunger discomfort as well.  Patient describes improvement in her rash after 1 week of being on Benadryl and stopping antibiotics.  Patient is hopeful that we can  get her ERCP scheduled soon and get the bile duct stent out.  The patient's daughter has asked whether colon cancer screening can be considered as her mother has not had one in greater than 20 years per her report.  Patient states she has a normal bowel movement on a daily basis.  She has not had any blood in her stools.  She denies any tenesmus.  GI Review of Systems Positive as above Negative for dysphagia, odynophagia, nausea, vomiting, bloating  Review of Systems General: Positive for unintentional weight loss which has been ascribed to her hospitalization but the patient's daughter feels that she is eating well at this time; denies fevers/chills Cardiovascular: Denies chest pain Pulmonary: Denies shortness of breath Gastroenterological: See HPI Genitourinary: Denies darkened urine Hematological: Denies easy bruising/bleeding Dermatological: Denies jaundice Psychological: Mood is stable   Medications Current Outpatient Medications  Medication Sig Dispense Refill   lisinopril-hydrochlorothiazide (ZESTORETIC) 20-25 MG tablet Take 1 tablet by mouth daily.     Multiple Vitamins-Minerals (MULTIVITAMIN WITH MINERALS) tablet Take 1 tablet by mouth daily.     pantoprazole (PROTONIX) 20 MG tablet Take 1 tablet (20 mg total) by mouth daily. 30 tablet 2   prednisoLONE acetate (PRED FORTE) 1 % ophthalmic suspension Place 1 drop into the right eye 4 (four) times daily.     rosuvastatin (CRESTOR) 20 MG tablet Take 1 tablet (20 mg total) by mouth every evening. 90 tablet 3   No current facility-administered medications for this visit.    Allergies Allergies  Allergen Reactions   Aspirin Nausea And Vomiting   Amoxicillin Rash   Clarithromycin Rash    Histories Past Medical  History:  Diagnosis Date   Anemia    Hyperlipidemia, familial, high LDL 01/30/2019   Hypertension    Prediabetes    Past Surgical History:  Procedure Laterality Date   BILIARY STENT PLACEMENT  07/20/2020    Procedure: BILIARY STENT PLACEMENT;  Surgeon: Ladene Artist, MD;  Location: [facility] ENDOSCOPY;  Service: Endoscopy;;   BILIARY STENT PLACEMENT  07/23/2020   Procedure: BILIARY STENT PLACEMENT;  Surgeon: Irving Copas., MD;  Location: [place];  Service: Gastroenterology;;   BIOPSY  07/23/2020   Procedure: BIOPSY;  Surgeon: Irving Copas., MD;  Location: [place];  Service: Gastroenterology;;   CATARACT EXTRACTION, BILATERAL  2019   CHOLECYSTECTOMY N/A 07/24/2020   Procedure: LAPAROSCOPIC CHOLECYSTECTOMY;  Surgeon: Georganna Skeans, MD;  Location: [place];  Service: General;  Laterality: N/A;   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   ENDOSCOPIC RETROGRADE CHOLANGIOPANCREATOGRAPHY (ERCP) WITH PROPOFOL N/A 07/23/2020   Procedure: ENDOSCOPIC RETROGRADE CHOLANGIOPANCREATOGRAPHY (ERCP) WITH PROPOFOL;  Surgeon: Irving Copas., MD;  Location: [place];  Service: Gastroenterology;  Laterality: N/A;   ERCP N/A 07/20/2020   Procedure: ENDOSCOPIC RETROGRADE CHOLANGIOPANCREATOGRAPHY (ERCP);  Surgeon: Ladene Artist, MD;  Location: [facility] ENDOSCOPY;  Service: Endoscopy;  Laterality: N/A;   HEMOSTASIS CONTROL  07/20/2020   Procedure: HEMOSTASIS CONTROL;  Surgeon: Ladene Artist, MD;  Location: [place];  Service: Endoscopy;;   REMOVAL OF STONES  07/20/2020   Procedure: REMOVAL OF STONES;  Surgeon: Ladene Artist, MD;  Location: [place];  Service: Endoscopy;;   REMOVAL OF STONES  07/23/2020   Procedure: REMOVAL OF STONES;  Surgeon: Irving Copas., MD;  Location: [place];  Service: Gastroenterology;;   Joan Mayans  07/20/2020   Procedure: [id number];  Surgeon: Ladene Artist, MD;  Location: [facility] ENDOSCOPY;  Service: Endoscopy;;   SPYGLASS CHOLANGIOSCOPY N/A 07/23/2020   Procedure: [id number] CHOLANGIOSCOPY;  Surgeon: Irving Copas., MD;  Location: [place];  Service: Gastroenterology;  Laterality: N/A;   SPYGLASS LITHOTRIPSY N/A 07/23/2020    Procedure: [id number] LITHOTRIPSY;  Surgeon: Irving Copas., MD;  Location: [place];  Service: Gastroenterology;  Laterality: N/A;   STENT REMOVAL  07/23/2020   Procedure: STENT REMOVAL;  Surgeon: Irving Copas., MD;  Location: [facility] ENDOSCOPY;  Service: Gastroenterology;;   Social History   Socioeconomic History   Marital status: Married    Spouse name: Not on file   Number of children: 3   Years of education: Not on file   Highest education level: Not on file  Occupational History   Occupation: retired  Tobacco Use   Smoking status: Never   Smokeless tobacco: Never  Vaping Use   Vaping Use: Never used  Substance and Sexual Activity   Alcohol use: No   Drug use: No   Sexual activity: Not Currently  Other Topics Concern   Not on file  Social History Narrative   Born in [place]. Moved to [place] about 1991.   Social Determinants of Health   Financial Resource Strain: Not on file  Food Insecurity: Not on file  Transportation Needs: Not on file  Physical Activity: Not on file  Stress: Not on file  Social Connections: Not on file  Intimate Partner Violence: Not on file   Family History  Problem Relation Age of Onset   Hypertension Mother    Hypertension Father    Diabetes Sister    Gallstones Other    Hyperlipidemia Daughter    Other Neg Hx    Colon cancer Neg Hx  Esophageal cancer Neg Hx    Inflammatory bowel disease Neg Hx    Liver disease Neg Hx    Pancreatic cancer Neg Hx    Rectal cancer Neg Hx    Stomach cancer Neg Hx    I have reviewed her medical, social, and family history in detail and updated the electronic medical record as necessary.    PHYSICAL EXAMINATION  BP (!) 150/70 (BP Location: Left Arm, Patient Position: Sitting, Cuff Size: Normal)   Pulse 68   Ht 5' 0.5" (1.537 m) Comment: height measured without shoes  Wt 102 lb 2 oz (46.3 kg)   BMI 19.62 kg/m  Wt Readings from Last 3 Encounters:  09/10/20 102 lb 2 oz (46.3 kg)   09/09/20 103 lb 3.2 oz (46.8 kg)  07/23/20 115 lb (52.2 kg)  GEN: NAD, appears stated age, doesn't appear chronically ill, accompanied by daughter PSYCH: Cooperative, without pressured speech EYE: Conjunctivae pink, sclerae anicteric ENT: MMM CV: Nontachycardic RESP: No audible wheezing GI: NABS, soft, laparoscopic surgical scars present, mild tenderness to palpation in umbilical region but other right upper quadrant scars are healthy in appearance with some Steri-Strips still in place, without rebound or guarding  MSK/EXT: No lower extremity edema SKIN: No jaundice NEURO:  Alert & Oriented x 3, no focal deficits   REVIEW OF DATA  I reviewed the following data at the time of this encounter:  GI Procedures and Studies  June 2022 ERCP - Erosive gastropathy with no bleeding and no stigmata of recent bleeding. Biopsied for HP. - No gross lesions in the duodenal bulb, in the first portion of the duodenum and in the second portion of the duodenum. - The major papilla was on the rim of a diverticulum. Prior biliary sphincterotomy appeared open. - One visibly patent stent from the biliary tree was seen in the major papilla. This was removed. - The entire main bile duct was severely dilated. - Choledocholithiasis was found. Complete incomplete initial removal with sweeping. - Cholangioscopy performed. Abnormal biliary tract mucosa was found noted in the cystic duct (low-lying insertion). - Filling defects consistent with a stone and sludge were seen on cholangioscopy. - Electohydraulic Lithotripsy was successful to 2 large stones. - The biliary tree was swept. - One plastic biliary stent was placed into the common bile duct to ensure that the small stone fragments from significant EHL would not lead to collection distally in this very large biliary duct leading to occlusion. This will be able to hopefully decompress the biliary system over the coming weeks and eventually do a completion  clear out after cholecystectomy.  June 2022 ERCP - The major papilla was located partially within a diverticulum. - The common bile duct was dilated, with stones causing an obstruction. - Choledocholithiasis was found. Partial removal was accomplished with biliary sphincterotomy, balloon clearing; a biliary stent was inserted. - A biliary sphincterotomy was performed. - The biliary tree was swept. - One plastic stent was placed into the common bile duct.  Pathology FINAL MICROSCOPIC DIAGNOSIS:  A. STOMACH, BIOPSY:  -  Chronic active gastritis  -  H. pylori organisms present  -  No intestinal metaplasia identified  -  See comment  COMMENT:  Warthin-Starry stain is POSITIVE for organisms consistent with  Helicobacter pylori.  Laboratory Studies  Reviewed those in epic  Imaging Studies  June 2022 CT abdomen pelvis without contrast IMPRESSION: 1. Marked biliary duct dilatation secondary to a distal common duct stone or stones, on the order of 1.2  cm. 2. Gallbladder distension, without evidence of acute cholecystitis. 3. Mild motion degradation. 4.  Aortic Atherosclerosis (ICD10-I70.0).   ASSESSMENT  Ms. Scala is a 75 y.o. femalewith a pmh significant for hypertension, hyperlipidemia, status post cholecystectomy (cholelithiasis requiring ERCP and stenting due to extensive stone burden), H. pylori gastritis (eradication pending).  The patient is seen today for evaluation and management of:  1. Choledocholithiasis   2. History of ERCP   3. History of biliary duct stent placement   4. History of Helicobacter infection   5. Colon cancer screening    The patient is hemodynamically stable and clinically stable at this time.  Liver tests reviewed from yesterday into today and show normalization of liver tests suggesting that bile duct stent is in good position.  Patient will need repeat ERCP for biliary stent removal, final EHL and biliary duct cleanout to be scheduled.  We will work  on getting that in the next few weeks.  I am going to evaluate the patient to see if her H. pylori infection is still present or not via stool antigen testing.  If she does not bring back the stool antigen testing we will plan for biopsies of the stomach at the time of repeat ERCP.  We discussed the potential of colon cancer screening and the patient's daughter and patient feel that they likely will want to move forward with this but they will want to wait a few months and I think that is reasonable as she is not having any overt symptoms.  However, if the patient is found to have iron deficiency anemia in the future we definitely need to consider that a bit sooner.  Time will tell.  We will work on getting this scheduled in a few months time.  The risks and benefits of endoscopic evaluation were discussed with the patient; these include but are not limited to the risk of perforation, infection, bleeding, missed lesions, lack of diagnosis, severe illness requiring hospitalization, as well as anesthesia and sedation related illnesses.  The patient and/or family is agreeable to proceed.   The risks of an ERCP were discussed at length, including but not limited to the risk of perforation, bleeding, abdominal pain, post-ERCP pancreatitis (while usually mild can be severe and even life threatening).  All patient questions were answered to the best of my ability, and the patient agrees to the aforementioned plan of action with follow-up as indicated.   PLAN  Obtain H. pylori stool antigen May restart Protonix 20 mg daily after H. pylori stool antigen has been obtained Proceed with scheduling ERCP with possible spyglass/EHL Colonoscopy for screening purposes in next 2 to 3 months Gastric biopsies will be obtained if H. pylori stool antigen is not completed by time of ERCP   Orders Placed This Encounter  Procedures   Procedural/ Surgical Case Request: ENDOSCOPIC RETROGRADE CHOLANGIOPANCREATOGRAPHY (ERCP) WITH  PROPOFOL   Helicobacter pylori special antigen   Ambulatory referral to Gastroenterology    New Prescriptions   PANTOPRAZOLE (PROTONIX) 20 MG TABLET    Take 1 tablet (20 mg total) by mouth daily.   Modified Medications   No medications on file    Planned Follow Up No follow-ups on file.   Total Time in Face-to-Face and in Coordination of Care for patient including independent/personal interpretation/review of prior testing, medical history, examination, medication adjustment, communicating results with the patient directly, and documentation with the EHR is 25 minutes.   Justice Britain, MD [facility] Office # [phone]

## 2020-09-10 NOTE — Patient Instructions (Signed)
Your provider has requested that you go to the basement level for lab work before leaving today. Press "B" on the elevator. The lab is located at the first door on the left as you exit the elevator.  We have sent the following medications to your pharmacy for you to pick up at your convenience: Protonix  ( Do not start Protonix until after stool sample has been done.)   START: Protonix 20mg - Take 1 tablet once daily. ( After turning in stool study.)  You have been scheduled for an endoscopy. Please follow written instructions given to you at your visit today. If you use inhalers (even only as needed), please bring them with you on the day of your procedure.  If you are age 73 or older, your body mass index should be between 23-30. Your Body mass index is 19.62 kg/m. If this is out of the aforementioned range listed, please consider follow up with your Primary Care Provider.  If you are age 45 or younger, your body mass index should be between 19-25. Your Body mass index is 19.62 kg/m. If this is out of the aformentioned range listed, please consider follow up with your Primary Care Provider.   __________________________________________________________  The [facility] providers would like to encourage you to use [facility] to communicate with providers for non-urgent requests or questions.  Due to long hold times on the telephone, sending your provider a message by [facility] may be a faster and more efficient way to get a response.  Please allow 48 business hours for a response.  Please remember that this is for non-urgent requests.   Thank you for choosing me and [facility].  Dr. [facility]

## 2020-09-10 NOTE — H&P (View-Only) (Signed)
[facility] VISIT   Primary Care Provider Haydee Salter, MD [place] [address] [phone]  Patient Profile: Becky Turner is a 75 y.o. female with a pmh significant for hypertension, hyperlipidemia, status post cholecystectomy (cholelithiasis requiring ERCP and stenting due to extensive stone burden), H. pylori gastritis (eradication pending).  The patient presents to the [facility] for an evaluation and management of problem(s) noted below:  Problem List 1. Choledocholithiasis   2. History of ERCP   3. History of biliary duct stent placement   4. History of Helicobacter infection   5. Colon cancer screening     History of Present Illness This is a patient that my partner Dr. Fuller Plan met back in June when she presented with symptomatic choledocholithiasis.  She underwent an ERCP with stenting due to extensive stone burden.  I repeated an ERCP during that hospitalization with EHL and stone fragmentation.  Significant stone burden and fragmentation led me to leave a plastic stent in place.  She underwent a cholecystectomy during hospitalization without complication.  She was found to have H. pylori gastritis after gastric biopsies were obtained on her second ERCP due to significant inflammation and she was treated with amoxicillin/clarithromycin/PPI but during her treatment she developed a rash with potential hives and she stopped therapy sooner than the completion of 10 days.  Patient has been seen in follow-up by surgery as well as with her PCP.  Today, the patient comes in for follow-up.  Overall, the patient has been doing better than when she went into the hospital she has discomfort in the areas of her umbilical laparoscopic scar.  She occasionally is having hunger discomfort as well.  Patient describes improvement in her rash after 1 week of being on Benadryl and stopping antibiotics.  Patient is hopeful that we can  get her ERCP scheduled soon and get the bile duct stent out.  The patient's daughter has asked whether colon cancer screening can be considered as her mother has not had one in greater than 20 years per her report.  Patient states she has a normal bowel movement on a daily basis.  She has not had any blood in her stools.  She denies any tenesmus.  GI Review of Systems Positive as above Negative for dysphagia, odynophagia, nausea, vomiting, bloating  Review of Systems General: Positive for unintentional weight loss which has been ascribed to her hospitalization but the patient's daughter feels that she is eating well at this time; denies fevers/chills Cardiovascular: Denies chest pain Pulmonary: Denies shortness of breath Gastroenterological: See HPI Genitourinary: Denies darkened urine Hematological: Denies easy bruising/bleeding Dermatological: Denies jaundice Psychological: Mood is stable   Medications Current Outpatient Medications  Medication Sig Dispense Refill   lisinopril-hydrochlorothiazide (ZESTORETIC) 20-25 MG tablet Take 1 tablet by mouth daily.     Multiple Vitamins-Minerals (MULTIVITAMIN WITH MINERALS) tablet Take 1 tablet by mouth daily.     pantoprazole (PROTONIX) 20 MG tablet Take 1 tablet (20 mg total) by mouth daily. 30 tablet 2   prednisoLONE acetate (PRED FORTE) 1 % ophthalmic suspension Place 1 drop into the right eye 4 (four) times daily.     rosuvastatin (CRESTOR) 20 MG tablet Take 1 tablet (20 mg total) by mouth every evening. 90 tablet 3   No current facility-administered medications for this visit.    Allergies Allergies  Allergen Reactions   Aspirin Nausea And Vomiting   Amoxicillin Rash   Clarithromycin Rash    Histories Past Medical  History:  Diagnosis Date   Anemia    Hyperlipidemia, familial, high LDL 01/30/2019   Hypertension    Prediabetes    Past Surgical History:  Procedure Laterality Date   BILIARY STENT PLACEMENT  07/20/2020    Procedure: BILIARY STENT PLACEMENT;  Surgeon: Ladene Artist, MD;  Location: [facility] ENDOSCOPY;  Service: Endoscopy;;   BILIARY STENT PLACEMENT  07/23/2020   Procedure: BILIARY STENT PLACEMENT;  Surgeon: Irving Copas., MD;  Location: [place];  Service: Gastroenterology;;   BIOPSY  07/23/2020   Procedure: BIOPSY;  Surgeon: Irving Copas., MD;  Location: [place];  Service: Gastroenterology;;   CATARACT EXTRACTION, BILATERAL  2019   CHOLECYSTECTOMY N/A 07/24/2020   Procedure: LAPAROSCOPIC CHOLECYSTECTOMY;  Surgeon: Georganna Skeans, MD;  Location: [place];  Service: General;  Laterality: N/A;   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   ENDOSCOPIC RETROGRADE CHOLANGIOPANCREATOGRAPHY (ERCP) WITH PROPOFOL N/A 07/23/2020   Procedure: ENDOSCOPIC RETROGRADE CHOLANGIOPANCREATOGRAPHY (ERCP) WITH PROPOFOL;  Surgeon: Irving Copas., MD;  Location: [place];  Service: Gastroenterology;  Laterality: N/A;   ERCP N/A 07/20/2020   Procedure: ENDOSCOPIC RETROGRADE CHOLANGIOPANCREATOGRAPHY (ERCP);  Surgeon: Ladene Artist, MD;  Location[facility] ENDOSCOPY;  Service: Endoscopy;  Laterality: N/A;   HEMOSTASIS CONTROL  07/20/2020   Procedure: HEMOSTASIS CONTROL;  Surgeon: Ladene Artist, MD;  Location: [place];  Service: Endoscopy;;   REMOVAL OF STONES  07/20/2020   Procedure: REMOVAL OF STONES;  Surgeon: Ladene Artist, MD;  Location: [place];  Service: Endoscopy;;   REMOVAL OF STONES  07/23/2020   Procedure: REMOVAL OF STONES;  Surgeon: Irving Copas., MD;  Location: [place];  Service: Gastroenterology;;   Joan Mayans  07/20/2020   Procedure: [id number];  Surgeon: Ladene Artist, MD;  Location: [facility] ENDOSCOPY;  Service: Endoscopy;;   SPYGLASS CHOLANGIOSCOPY N/A 07/23/2020   Procedure: LJQGBEEF CHOLANGIOSCOPY;  Surgeon: Irving Copas., MD;  Location: [place];  Service: Gastroenterology;  Laterality: N/A;   SPYGLASS LITHOTRIPSY N/A 07/23/2020    Procedure: EOFHQRFX LITHOTRIPSY;  Surgeon: Irving Copas., MD;  Location: [place];  Service: Gastroenterology;  Laterality: N/A;   STENT REMOVAL  07/23/2020   Procedure: STENT REMOVAL;  Surgeon: Irving Copas., MD;  Location: [facility] ENDOSCOPY;  Service: Gastroenterology;;   Social History   Socioeconomic History   Marital status: Married    Spouse name: Not on file   Number of children: 3   Years of education: Not on file   Highest education level: Not on file  Occupational History   Occupation: retired  Tobacco Use   Smoking status: Never   Smokeless tobacco: Never  Vaping Use   Vaping Use: Never used  Substance and Sexual Activity   Alcohol use: No   Drug use: No   Sexual activity: Not Currently  Other Topics Concern   Not on file  Social History Narrative   Born in [place]. Moved to [place] about 1991.   Social Determinants of Health   Financial Resource Strain: Not on file  Food Insecurity: Not on file  Transportation Needs: Not on file  Physical Activity: Not on file  Stress: Not on file  Social Connections: Not on file  Intimate Partner Violence: Not on file   Family History  Problem Relation Age of Onset   Hypertension Mother    Hypertension Father    Diabetes Sister    Gallstones Other    Hyperlipidemia Daughter    Other Neg Hx    Colon cancer Neg Hx  Esophageal cancer Neg Hx    Inflammatory bowel disease Neg Hx    Liver disease Neg Hx    Pancreatic cancer Neg Hx    Rectal cancer Neg Hx    Stomach cancer Neg Hx    I have reviewed her medical, social, and family history in detail and updated the electronic medical record as necessary.    PHYSICAL EXAMINATION  BP (!) 150/70 (BP Location: Left Arm, Patient Position: Sitting, Cuff Size: Normal)   Pulse 68   Ht 5' 0.5" (1.537 m) Comment: height measured without shoes  Wt 102 lb 2 oz (46.3 kg)   BMI 19.62 kg/m  Wt Readings from Last 3 Encounters:  09/10/20 102 lb 2 oz (46.3 kg)   09/09/20 103 lb 3.2 oz (46.8 kg)  07/23/20 115 lb (52.2 kg)  GEN: NAD, appears stated age, doesn't appear chronically ill, accompanied by daughter PSYCH: Cooperative, without pressured speech EYE: Conjunctivae pink, sclerae anicteric ENT: MMM CV: Nontachycardic RESP: No audible wheezing GI: NABS, soft, laparoscopic surgical scars present, mild tenderness to palpation in umbilical region but other right upper quadrant scars are healthy in appearance with some Steri-Strips still in place, without rebound or guarding  MSK/EXT: No lower extremity edema SKIN: No jaundice NEURO:  Alert & Oriented x 3, no focal deficits   REVIEW OF DATA  I reviewed the following data at the time of this encounter:  GI Procedures and Studies  June 2022 ERCP - Erosive gastropathy with no bleeding and no stigmata of recent bleeding. Biopsied for HP. - No gross lesions in the duodenal bulb, in the first portion of the duodenum and in the second portion of the duodenum. - The major papilla was on the rim of a diverticulum. Prior biliary sphincterotomy appeared open. - One visibly patent stent from the biliary tree was seen in the major papilla. This was removed. - The entire main bile duct was severely dilated. - Choledocholithiasis was found. Complete incomplete initial removal with sweeping. - Cholangioscopy performed. Abnormal biliary tract mucosa was found noted in the cystic duct (low-lying insertion). - Filling defects consistent with a stone and sludge were seen on cholangioscopy. - Electohydraulic Lithotripsy was successful to 2 large stones. - The biliary tree was swept. - One plastic biliary stent was placed into the common bile duct to ensure that the small stone fragments from significant EHL would not lead to collection distally in this very large biliary duct leading to occlusion. This will be able to hopefully decompress the biliary system over the coming weeks and eventually do a completion  clear out after cholecystectomy.  June 2022 ERCP - The major papilla was located partially within a diverticulum. - The common bile duct was dilated, with stones causing an obstruction. - Choledocholithiasis was found. Partial removal was accomplished with biliary sphincterotomy, balloon clearing; a biliary stent was inserted. - A biliary sphincterotomy was performed. - The biliary tree was swept. - One plastic stent was placed into the common bile duct.  Pathology FINAL MICROSCOPIC DIAGNOSIS:  A. STOMACH, BIOPSY:  -  Chronic active gastritis  -  H. pylori organisms present  -  No intestinal metaplasia identified  -  See comment  COMMENT:  Warthin-Starry stain is POSITIVE for organisms consistent with  Helicobacter pylori.  Laboratory Studies  Reviewed those in epic  Imaging Studies  June 2022 CT abdomen pelvis without contrast IMPRESSION: 1. Marked biliary duct dilatation secondary to a distal common duct stone or stones, on the order of 1.2  cm. 2. Gallbladder distension, without evidence of acute cholecystitis. 3. Mild motion degradation. 4.  Aortic Atherosclerosis (ICD10-I70.0).   ASSESSMENT  Ms. Cooley is a 75 y.o. femalewith a pmh significant for hypertension, hyperlipidemia, status post cholecystectomy (cholelithiasis requiring ERCP and stenting due to extensive stone burden), H. pylori gastritis (eradication pending).  The patient is seen today for evaluation and management of:  1. Choledocholithiasis   2. History of ERCP   3. History of biliary duct stent placement   4. History of Helicobacter infection   5. Colon cancer screening    The patient is hemodynamically stable and clinically stable at this time.  Liver tests reviewed from yesterday into today and show normalization of liver tests suggesting that bile duct stent is in good position.  Patient will need repeat ERCP for biliary stent removal, final EHL and biliary duct cleanout to be scheduled.  We will work  on getting that in the next few weeks.  I am going to evaluate the patient to see if her H. pylori infection is still present or not via stool antigen testing.  If she does not bring back the stool antigen testing we will plan for biopsies of the stomach at the time of repeat ERCP.  We discussed the potential of colon cancer screening and the patient's daughter and patient feel that they likely will want to move forward with this but they will want to wait a few months and I think that is reasonable as she is not having any overt symptoms.  However, if the patient is found to have iron deficiency anemia in the future we definitely need to consider that a bit sooner.  Time will tell.  We will work on getting this scheduled in a few months time.  The risks and benefits of endoscopic evaluation were discussed with the patient; these include but are not limited to the risk of perforation, infection, bleeding, missed lesions, lack of diagnosis, severe illness requiring hospitalization, as well as anesthesia and sedation related illnesses.  The patient and/or family is agreeable to proceed.   The risks of an ERCP were discussed at length, including but not limited to the risk of perforation, bleeding, abdominal pain, post-ERCP pancreatitis (while usually mild can be severe and even life threatening).  All patient questions were answered to the best of my ability, and the patient agrees to the aforementioned plan of action with follow-up as indicated.   PLAN  Obtain H. pylori stool antigen May restart Protonix 20 mg daily after H. pylori stool antigen has been obtained Proceed with scheduling ERCP with possible spyglass/EHL Colonoscopy for screening purposes in next 2 to 3 months Gastric biopsies will be obtained if H. pylori stool antigen is not completed by time of ERCP   Orders Placed This Encounter  Procedures   Procedural/ Surgical Case Request: ENDOSCOPIC RETROGRADE CHOLANGIOPANCREATOGRAPHY (ERCP) WITH  PROPOFOL   Helicobacter pylori special antigen   Ambulatory referral to Gastroenterology    New Prescriptions   PANTOPRAZOLE (PROTONIX) 20 MG TABLET    Take 1 tablet (20 mg total) by mouth daily.   Modified Medications   No medications on file    Planned Follow Up No follow-ups on file.   Total Time in Face-to-Face and in Coordination of Care for patient including independent/personal interpretation/review of prior testing, medical history, examination, medication adjustment, communicating results with the patient directly, and documentation with the EHR is 25 minutes.   Justice Britain, MD [facility] Office # [phone]

## 2020-09-11 ENCOUNTER — Encounter: Payer: Self-pay | Admitting: Gastroenterology

## 2020-09-11 ENCOUNTER — Encounter: Payer: Self-pay | Admitting: Family Medicine

## 2020-09-11 DIAGNOSIS — Z8619 Personal history of other infectious and parasitic diseases: Secondary | ICD-10-CM | POA: Insufficient documentation

## 2020-09-11 DIAGNOSIS — Z1211 Encounter for screening for malignant neoplasm of colon: Secondary | ICD-10-CM | POA: Insufficient documentation

## 2020-09-11 DIAGNOSIS — Z9889 Other specified postprocedural states: Secondary | ICD-10-CM | POA: Insufficient documentation

## 2020-09-17 ENCOUNTER — Other Ambulatory Visit: Payer: Medicare Other

## 2020-09-17 DIAGNOSIS — Z8619 Personal history of other infectious and parasitic diseases: Secondary | ICD-10-CM

## 2020-09-17 DIAGNOSIS — Z9889 Other specified postprocedural states: Secondary | ICD-10-CM | POA: Diagnosis not present

## 2020-09-17 DIAGNOSIS — Z1211 Encounter for screening for malignant neoplasm of colon: Secondary | ICD-10-CM

## 2020-09-17 DIAGNOSIS — K8050 Calculus of bile duct without cholangitis or cholecystitis without obstruction: Secondary | ICD-10-CM

## 2020-09-18 LAB — HELICOBACTER PYLORI  SPECIAL ANTIGEN
MICRO NUMBER:: 12219616
SPECIMEN QUALITY: ADEQUATE

## 2020-09-19 ENCOUNTER — Ambulatory Visit: Payer: Medicare Other | Admitting: Family Medicine

## 2020-10-04 NOTE — Progress Notes (Signed)
Attempted to obtain medical history via telephone, unable to reach at this time. I left a voicemail to return pre surgical testing department's phone call. Used pacific interpreter ID# [phone]

## 2020-10-09 ENCOUNTER — Ambulatory Visit (HOSPITAL_COMMUNITY)
Admission: RE | Admit: 2020-10-09 | Discharge: 2020-10-09 | Disposition: A | Discharge status: Home / Self Care | Payer: Medicare Other | Attending: Gastroenterology | Admitting: Gastroenterology

## 2020-10-09 ENCOUNTER — Encounter (HOSPITAL_COMMUNITY)
Admission: RE | Disposition: A | Discharge status: Home / Self Care | Payer: Self-pay | Source: Home / Self Care | Attending: Gastroenterology

## 2020-10-09 ENCOUNTER — Ambulatory Visit (HOSPITAL_COMMUNITY): Payer: Medicare Other

## 2020-10-09 ENCOUNTER — Other Ambulatory Visit: Payer: Self-pay

## 2020-10-09 ENCOUNTER — Encounter (HOSPITAL_COMMUNITY): Payer: Self-pay | Admitting: Gastroenterology

## 2020-10-09 ENCOUNTER — Ambulatory Visit (HOSPITAL_COMMUNITY): Payer: Medicare Other | Admitting: Certified Registered"

## 2020-10-09 DIAGNOSIS — Z8349 Family history of other endocrine, nutritional and metabolic diseases: Secondary | ICD-10-CM | POA: Insufficient documentation

## 2020-10-09 DIAGNOSIS — K2960 Other gastritis without bleeding: Secondary | ICD-10-CM | POA: Diagnosis not present

## 2020-10-09 DIAGNOSIS — Z8719 Personal history of other diseases of the digestive system: Secondary | ICD-10-CM | POA: Insufficient documentation

## 2020-10-09 DIAGNOSIS — Z8249 Family history of ischemic heart disease and other diseases of the circulatory system: Secondary | ICD-10-CM | POA: Diagnosis not present

## 2020-10-09 DIAGNOSIS — N1831 Chronic kidney disease, stage 3a: Secondary | ICD-10-CM | POA: Diagnosis not present

## 2020-10-09 DIAGNOSIS — I129 Hypertensive chronic kidney disease with stage 1 through stage 4 chronic kidney disease, or unspecified chronic kidney disease: Secondary | ICD-10-CM | POA: Diagnosis not present

## 2020-10-09 DIAGNOSIS — Z1211 Encounter for screening for malignant neoplasm of colon: Secondary | ICD-10-CM

## 2020-10-09 DIAGNOSIS — Z4659 Encounter for fitting and adjustment of other gastrointestinal appliance and device: Secondary | ICD-10-CM

## 2020-10-09 DIAGNOSIS — K3189 Other diseases of stomach and duodenum: Secondary | ICD-10-CM | POA: Insufficient documentation

## 2020-10-09 DIAGNOSIS — K2970 Gastritis, unspecified, without bleeding: Secondary | ICD-10-CM | POA: Insufficient documentation

## 2020-10-09 DIAGNOSIS — Z9049 Acquired absence of other specified parts of digestive tract: Secondary | ICD-10-CM | POA: Insufficient documentation

## 2020-10-09 DIAGNOSIS — Z9889 Other specified postprocedural states: Secondary | ICD-10-CM

## 2020-10-09 DIAGNOSIS — K8050 Calculus of bile duct without cholangitis or cholecystitis without obstruction: Secondary | ICD-10-CM

## 2020-10-09 DIAGNOSIS — K8020 Calculus of gallbladder without cholecystitis without obstruction: Secondary | ICD-10-CM | POA: Diagnosis not present

## 2020-10-09 DIAGNOSIS — K8060 Calculus of gallbladder and bile duct with cholecystitis, unspecified, without obstruction: Secondary | ICD-10-CM | POA: Insufficient documentation

## 2020-10-09 DIAGNOSIS — B9681 Helicobacter pylori [H. pylori] as the cause of diseases classified elsewhere: Secondary | ICD-10-CM | POA: Diagnosis not present

## 2020-10-09 DIAGNOSIS — D631 Anemia in chronic kidney disease: Secondary | ICD-10-CM | POA: Diagnosis not present

## 2020-10-09 DIAGNOSIS — Z8619 Personal history of other infectious and parasitic diseases: Secondary | ICD-10-CM

## 2020-10-09 HISTORY — PX: BILIARY DILATION: SHX6850

## 2020-10-09 HISTORY — PX: REMOVAL OF STONES: SHX5545

## 2020-10-09 HISTORY — PX: BIOPSY: SHX5522

## 2020-10-09 HISTORY — PX: ENDOSCOPIC RETROGRADE CHOLANGIOPANCREATOGRAPHY (ERCP) WITH PROPOFOL: SHX5810

## 2020-10-09 HISTORY — PX: SPYGLASS CHOLANGIOSCOPY: SHX5441

## 2020-10-09 HISTORY — PX: STENT REMOVAL: SHX6421

## 2020-10-09 SURGERY — ENDOSCOPIC RETROGRADE CHOLANGIOPANCREATOGRAPHY (ERCP) WITH PROPOFOL
Anesthesia: General

## 2020-10-09 MED ORDER — ONDANSETRON HCL 4 MG/2ML IJ SOLN
4.0000 mg | Freq: Once | INTRAMUSCULAR | Status: AC
  Administered 2020-10-09: 4 mg via INTRAVENOUS

## 2020-10-09 MED ORDER — CIPROFLOXACIN HCL 500 MG PO TABS: 500.0000 mg | ORAL_TABLET | Freq: Two times a day (BID) | ORAL | 0 refills | Status: AC

## 2020-10-09 MED ORDER — SODIUM CHLORIDE 0.9 % IV SOLN: 8.0000 mg | Freq: Once | INTRAVENOUS | Status: DC

## 2020-10-09 MED ORDER — INDOMETHACIN 50 MG RE SUPP
RECTAL | Status: DC | PRN
  Administered 2020-10-09: 100 mg via RECTAL

## 2020-10-09 MED ORDER — INDOMETHACIN 50 MG RE SUPP
RECTAL | Status: AC
  Filled 2020-10-09: qty 2

## 2020-10-09 MED ORDER — SUGAMMADEX SODIUM 200 MG/2ML IV SOLN
INTRAVENOUS | Status: DC | PRN
  Administered 2020-10-09: 100 mg via INTRAVENOUS

## 2020-10-09 MED ORDER — SCOPOLAMINE 1 MG/3DAYS TD PT72
MEDICATED_PATCH | TRANSDERMAL | Status: AC
  Filled 2020-10-09: qty 1

## 2020-10-09 MED ORDER — ONDANSETRON 4 MG PO TBDP: 4.0000 mg | ORAL_TABLET | Freq: Four times a day (QID) | ORAL | 0 refills | Status: DC | PRN

## 2020-10-09 MED ORDER — ROCURONIUM BROMIDE 10 MG/ML (PF) SYRINGE
PREFILLED_SYRINGE | INTRAVENOUS | Status: DC | PRN
  Administered 2020-10-09: 40 mg via INTRAVENOUS

## 2020-10-09 MED ORDER — DEXAMETHASONE SODIUM PHOSPHATE 10 MG/ML IJ SOLN
INTRAMUSCULAR | Status: DC | PRN
  Administered 2020-10-09: 4 mg via INTRAVENOUS

## 2020-10-09 MED ORDER — FENTANYL CITRATE (PF) 100 MCG/2ML IJ SOLN
INTRAMUSCULAR | Status: AC
  Filled 2020-10-09: qty 2

## 2020-10-09 MED ORDER — PHENYLEPHRINE 40 MCG/ML (10ML) SYRINGE FOR IV PUSH (FOR BLOOD PRESSURE SUPPORT)
PREFILLED_SYRINGE | INTRAVENOUS | Status: DC | PRN
  Administered 2020-10-09: 80 ug via INTRAVENOUS
  Administered 2020-10-09: 40 ug via INTRAVENOUS
  Administered 2020-10-09: 80 ug via INTRAVENOUS

## 2020-10-09 MED ORDER — CIPROFLOXACIN IN D5W 400 MG/200ML IV SOLN
INTRAVENOUS | Status: AC
  Filled 2020-10-09: qty 200

## 2020-10-09 MED ORDER — PROPOFOL 10 MG/ML IV BOLUS
INTRAVENOUS | Status: AC
  Filled 2020-10-09: qty 20

## 2020-10-09 MED ORDER — SODIUM CHLORIDE 0.9 % IV SOLN
INTRAVENOUS | Status: DC | PRN
  Administered 2020-10-09: 40 mL

## 2020-10-09 MED ORDER — ONDANSETRON HCL 4 MG/2ML IJ SOLN
INTRAMUSCULAR | Status: AC
  Filled 2020-10-09: qty 4

## 2020-10-09 MED ORDER — SCOPOLAMINE 1 MG/3DAYS TD PT72
MEDICATED_PATCH | TRANSDERMAL | Status: DC | PRN
  Administered 2020-10-09: 1 via TRANSDERMAL

## 2020-10-09 MED ORDER — SODIUM CHLORIDE 0.9 % IV SOLN
INTRAVENOUS | Status: DC
  Administered 2020-10-09: 500 mL via INTRAVENOUS

## 2020-10-09 MED ORDER — FENTANYL CITRATE (PF) 100 MCG/2ML IJ SOLN
INTRAMUSCULAR | Status: DC | PRN
  Administered 2020-10-09 (×2): 50 ug via INTRAVENOUS

## 2020-10-09 MED ORDER — LACTATED RINGERS IV SOLN: INTRAVENOUS | Status: DC | PRN

## 2020-10-09 MED ORDER — CIPROFLOXACIN IN D5W 400 MG/200ML IV SOLN
INTRAVENOUS | Status: DC | PRN
Start: 2020-10-09 — End: 2020-10-09
  Administered 2020-10-09: 400 mg via INTRAVENOUS

## 2020-10-09 MED ORDER — LIDOCAINE 2% (20 MG/ML) 5 ML SYRINGE
INTRAMUSCULAR | Status: DC | PRN
  Administered 2020-10-09: 80 mg via INTRAVENOUS

## 2020-10-09 MED ORDER — GLUCAGON HCL RDNA (DIAGNOSTIC) 1 MG IJ SOLR
INTRAMUSCULAR | Status: DC | PRN
  Administered 2020-10-09 (×3): .25 mg via INTRAVENOUS

## 2020-10-09 MED ORDER — PANTOPRAZOLE SODIUM 20 MG PO TBEC: 20.0000 mg | DELAYED_RELEASE_TABLET | Freq: Two times a day (BID) | ORAL | 2 refills | Status: DC

## 2020-10-09 MED ORDER — PROPOFOL 10 MG/ML IV BOLUS
INTRAVENOUS | Status: DC | PRN
  Administered 2020-10-09: 110 mg via INTRAVENOUS

## 2020-10-09 MED ORDER — ONDANSETRON HCL 4 MG/2ML IJ SOLN
INTRAMUSCULAR | Status: DC | PRN
  Administered 2020-10-09: 4 mg via INTRAVENOUS

## 2020-10-09 MED ORDER — CIPROFLOXACIN IN D5W 400 MG/200ML IV SOLN: 400.0000 mg | Freq: Once | INTRAVENOUS | Status: DC

## 2020-10-09 NOTE — Interval H&P Note (Signed)
History and Physical Interval Note:  10/09/2020 9:17 AM  Becky Turner  has presented today for surgery, with the diagnosis of Choledocholithiasis, Hx of commom buct stone, Biliary stent.  The various methods of treatment have been discussed with the patient and family. After consideration of risks, benefits and other options for treatment, the patient has consented to  Procedure(s): ENDOSCOPIC RETROGRADE CHOLANGIOPANCREATOGRAPHY (ERCP) WITH PROPOFOL (N/A) as a surgical intervention.  The patient's history has been reviewed, patient examined, no change in status, stable for surgery.  I have reviewed the patient's chart and labs.  Questions were answered to the patient's satisfaction.    The risks of an ERCP were discussed at length, including but not limited to the risk of perforation, bleeding, abdominal pain, post-ERCP pancreatitis (while usually mild can be severe and even life threatening).    [employer]

## 2020-10-09 NOTE — Discharge Instructions (Signed)
YOU HAD AN ENDOSCOPIC PROCEDURE TODAY: Refer to the procedure report and other information in the discharge instructions given to you for any specific questions about what was found during the examination. If this information does not answer your questions, please call [place] office at [phone] to clarify.   YOU SHOULD EXPECT: Some feelings of bloating in the abdomen. Passage of more gas than usual. Walking can help get rid of the air that was put into your GI tract during the procedure and reduce the bloating. If you had a lower endoscopy (such as a colonoscopy or flexible sigmoidoscopy) you may notice spotting of blood in your stool or on the toilet paper. Some abdominal soreness may be present for a day or two, also.  DIET: Your first meal following the procedure should be a light meal and then it is ok to progress to your normal diet. A half-sandwich or bowl of soup is an example of a good first meal. Heavy or fried foods are harder to digest and may make you feel nauseous or bloated. Drink plenty of fluids but you should avoid alcoholic beverages for 24 hours. If you had a esophageal dilation, please see attached instructions for diet.    ACTIVITY: Your care partner should take you home directly after the procedure. You should plan to take it easy, moving slowly for the rest of the day. You can resume normal activity the day after the procedure however YOU SHOULD NOT DRIVE, use power tools, machinery or perform tasks that involve climbing or major physical exertion for 24 hours (because of the sedation medicines used during the test).   SYMPTOMS TO REPORT IMMEDIATELY: A gastroenterologist can be reached at any hour. Please call [phone]  for any of the following symptoms:   Following upper endoscopy (EGD, EUS, ERCP, esophageal dilation) Vomiting of blood or coffee ground material  New, significant abdominal pain  New, significant chest pain or pain under the shoulder blades  Painful or  persistently difficult swallowing  New shortness of breath  Black, tarry-looking or red, bloody stools  FOLLOW UP:  If any biopsies were taken you will be contacted by phone or by letter within the next 1-3 weeks. Call [phone]  if you have not heard about the biopsies in 3 weeks.  Please also call with any specific questions about appointments or follow up tests.  

## 2020-10-09 NOTE — Op Note (Signed)
[facility] Patient Name: Becky Turner Procedure Date: 10/09/2020 MRN: [id number] Attending MD: Justice Britain , MD Date of Birth: [birth year] CSN: [id number] Age: 75 Admit Type: Outpatient Procedure:                ERCP Indications:              Bile duct stone(s), Stent removal, Epigastric                            abdominal pain, Follow-up of Helicobacter pylori Providers:                Justice Britain, MD, Jeanella Cara, RN,                            Tyna Jaksch Technician Referring MD:             Pricilla Riffle. Fuller Plan, MD, Haydee Salter Medicines:                General Anesthesia, Cipro 400 mg IV, Indomethacin                            100 mg PR, Glucagon 9.24 mg IV Complications:            No immediate complications. Estimated Blood Loss:     Estimated blood loss was minimal. Procedure:                Pre-Anesthesia Assessment:                           - Prior to the procedure, a History and Physical                            was performed, and patient medications and                            allergies were reviewed. The patient's tolerance of                            previous anesthesia was also reviewed. The risks                            and benefits of the procedure and the sedation                            options and risks were discussed with the patient.                            All questions were answered, and informed consent                            was obtained. Prior Anticoagulants: The patient has                            taken no previous anticoagulant or antiplatelet  agents. ASA Grade Assessment: III - A patient with                            severe systemic disease. After reviewing the risks                            and benefits, the patient was deemed in                            satisfactory condition to undergo the procedure.                           After obtaining informed  consent, the scope was                            passed under direct vision. Throughout the                            procedure, the patient's blood pressure, pulse, and                            oxygen saturations were monitored continuously. The                            [id number]-Q180V ([id number] Olympus duodenoscope was                            introduced through the mouth, and used to inject                            contrast into and used for direct visualization of                            the bile duct. The ERCP was technically difficult                            and complex. Successful completion of the procedure                            was aided by performing the maneuvers documented                            (below) in this report. The patient tolerated the                            procedure. Scope In: Scope Out: Findings:      A scout film of the abdomen was obtained. Surgical clips, consistent       with previous cholecystectomy, were seen in the area of the right upper       quadrant of the abdomen. One stent ending in the main bile duct was seen.      The upper GI tract was traversed under direct vision without detailed       examination. Patchy mildly erythematous mucosa without bleeding  was       found in the entire examined stomach - this was biopsied due to prior       history of H. pylori. No gross lesions were noted in the duodenal bulb,       in the first portion of the duodenum and in the second portion of the       duodenum. The major papilla was on the rim of a diverticulum. A biliary       sphincterotomy had been performed. The sphincterotomy appeared open. One       stent which had been placed through the major papilla into the biliary       tree was no longer visible initially and after more careful inspection,       I could see that it had migrated into the duct. The major papilla was       congested. 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 Very carefully, I used a       Raptor grasping device to grab the stent, push it upwards and with time       and patience, the stent was removed from the biliary tree.      A short 0.035 inch Soft Jagwire was passed into the biliary tree. The       Hydratome sphincterotome was passed over the guidewire and the bile duct       was then deeply cannulated. Contrast was injected. I personally       interpreted the bile duct images. Ductal flow of contrast was adequate.       Image quality was adequate. Contrast extended to the hepatic ducts.       Opacification of the entire biliary tree except for the cystic duct and       gallbladder was successful. The main bile duct was severely dilated. The       largest diameter was at least 22 mm. The main bile duct contained       filling defects thought to be sludge/stones/air bubbles. Dilation of the       common bile duct with an 09-17-08 mm balloon (to a maximum balloon size of       10 mm) dilator was successful as a sphincteroplasty. To discover       objects, the biliary tree was swept with a retrieval balloon starting at       the bifurcation with a 12-15 mm extraction balloon and then when the       noted dilation of the duct was found this was transitioned to a 15-18 mm       extraction balloon. Sludge was swept from the duct. A few stones and       stone fragments were removed. No stones remained after multiple       pull-throughs. I attempted an occlusion cholangiogram, but due to the       size of the duct, I could not get an adequate enough visualization that       larger stone fragments were not present. I decided to proceed with bile       duct exploration endoscopically using the SpyGlass direct  visualization        system. The SpyScope was advanced to the bifurcation. Visibility with       the scope was fair. The main bile duct was normal but significantly       dilated. I did not visualize large stone fragments to the near       bifurcation. The Spyscope was removed from the biliary tree.      A pancreatogram was not performed.      The duodenoscope was withdrawn from the patient. Impression:               - Erythematous mucosa in the stomach - biopsied due                            to prior history of H. pylori.                           - No gross lesions in the duodenal bulb, in the                            first portion of the duodenum and in the second                            portion of the duodenum.                           - The major papilla was on the rim of a                            diverticulum.                           - Prior biliary sphincterotomy appeared open.                           - The previously placed stent had migrated into the                            biliary tree. The major papilla appeared congested                            and ulcerated. 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                            After careful maneuvering, I was able to remove the                            stent without further complication or worsening of  the ulceration.                           - The entire main bile duct was severely dilated.                           - Sludge/stone fragments/air bubbles were found in                            the biliary tract upon entering and cholangiogram                            performance.                           - Choledocholithiasis was found. Complete removal                            was accomplished by sphincteroplasty, balloon                             sweeping.                           - I could not visualize the entire biliary tree due                            to the dilated duct, so I proceeded with Spyglass                            in effort of ensuring large stone fragments were                            not still present. These were not visualized up to                            the near bifurcation. Moderate Sedation:      Not Applicable - Patient had care per Anesthesia. Recommendation:           - The patient will be observed post-procedure,                            until all discharge criteria are met.                           - Discharge patient to home.                           - Patient has a contact number available for                            emergencies. The signs and symptoms of potential                            delayed complications were discussed with the  patient. Return to normal activities tomorrow.                            Written discharge instructions were provided to the                            patient.                           - Low fat diet for 1 week.                           - Watch for pancreatitis, bleeding, perforation,                            and cholangitis.                           - Ciprofloxacin 500 mg twice daily for 3-days to                            decrease risk of post-interventional infectious                            complications.                           - Increase PPI back to 20 mg twice daily for                            23-month                           - Await path results.                           - Check liver enzymes (AST, ALT, alkaline                            phosphatase, bilirubin) in 4 weeks.                           - Return to GI clinic in 1 month. We will discuss                            at that time, if the patient/family want to undergo                            colonoscopy for screening purposes.                            - The findings and recommendations were discussed                            with the patient.                           -  The findings and recommendations were discussed                            with the patient's family. Procedure Code(s):        --- Professional ---                           [phone], Endoscopic retrograde                            cholangiopancreatography (ERCP); with removal of                            foreign body(s) or stent(s) from biliary/pancreatic                            duct(s)                           43264, Endoscopic retrograde                            cholangiopancreatography (ERCP); with removal of                            calculi/debris from biliary/pancreatic duct(s)                           43273, Endoscopic cannulation of papilla with                            direct visualization of pancreatic/common bile                            duct(s) (List separately in addition to code(s) for                            primary procedure) Diagnosis Code(s):        --- Professional ---                           K31.89, Other diseases of stomach and duodenum                           T85.520A, Displacement of bile duct prosthesis,                            initial encounter                           K83.8, Other specified diseases of biliary tract                           K80.50, Calculus of bile duct without cholangitis                            or cholecystitis without obstruction  Z46.59, Encounter for fitting and adjustment of                            other gastrointestinal appliance and device                           R10.13, Epigastric pain                           T35.33, Helicobacter pylori [H. pylori] as the                            cause of diseases classified elsewhere                           R93.2, Abnormal findings on diagnostic imaging of                            liver and biliary  tract CPT copyright 2019 [employer]. All rights reserved. The codes documented in this report are preliminary and upon coder review may  be revised to meet current compliance requirements. Justice Britain, MD 10/09/2020 11:29:28 AM Number of Addenda: 0

## 2020-10-09 NOTE — Anesthesia Postprocedure Evaluation (Signed)
Anesthesia Post Note  Patient: Becky Turner  Procedure(s) Performed: ENDOSCOPIC RETROGRADE CHOLANGIOPANCREATOGRAPHY (ERCP) WITH PROPOFOL STENT REMOVAL REMOVAL OF STONES BILIARY DILATION SPYGLASS CHOLANGIOSCOPY BIOPSY     Patient location during evaluation: Endoscopy Anesthesia Type: General Level of consciousness: awake and alert Pain management: pain level controlled Vital Signs Assessment: post-procedure vital signs reviewed and stable Respiratory status: spontaneous breathing, nonlabored ventilation, respiratory function stable and patient connected to nasal cannula oxygen Cardiovascular status: blood pressure returned to baseline and stable Postop Assessment: no apparent nausea or vomiting Anesthetic complications: no   No notable events documented.  Last Vitals:  Vitals:   10/09/20 1220 10/09/20 1230  BP: (!) 167/63 (!) 178/58  Pulse: (!) 56 (!) 54  Resp:    Temp:    SpO2: 100% 99%    Last Pain:  Vitals:   10/09/20 1230  TempSrc:   PainSc: 0-No pain                 Trevor Iha

## 2020-10-09 NOTE — Anesthesia Procedure Notes (Signed)
Procedure Name: Intubation Date/Time: 10/09/2020 9:50 AM Performed by: Montel Clock, CRNA Pre-anesthesia Checklist: Patient identified, Emergency Drugs available, Suction available, Patient being monitored and Timeout performed Patient Re-evaluated:Patient Re-evaluated prior to induction Oxygen Delivery Method: Circle system utilized Preoxygenation: Pre-oxygenation with 100% oxygen Induction Type: IV induction Ventilation: Mask ventilation without difficulty Laryngoscope Size: Mac and 3 Grade View: Grade II Tube type: Oral Tube size: 7.0 mm Number of attempts: 1 Airway Equipment and Method: Stylet Placement Confirmation: ETT inserted through vocal cords under direct vision, positive ETCO2 and breath sounds checked- equal and bilateral Secured at: 21 cm Tube secured with: Tape Dental Injury: Teeth and Oropharynx as per pre-operative assessment

## 2020-10-09 NOTE — Transfer of Care (Signed)
Immediate Anesthesia Transfer of Care Note  Patient: Becky Turner  Procedure(s) Performed: ENDOSCOPIC RETROGRADE CHOLANGIOPANCREATOGRAPHY (ERCP) WITH PROPOFOL STENT REMOVAL REMOVAL OF STONES BILIARY DILATION SPYGLASS CHOLANGIOSCOPY BIOPSY  Patient Location: PACU and Endoscopy Unit  Anesthesia Type:General  Level of Consciousness: awake, alert  and oriented  Airway & Oxygen Therapy: Patient Spontanous Breathing and Patient connected to face mask  Post-op Assessment: Report given to RN and Post -op Vital signs reviewed and stable  Post vital signs: Reviewed and stable  Last Vitals:  Vitals Value Taken Time  BP    Temp    Pulse 58 10/09/20 1119  Resp 12 10/09/20 1119  SpO2 100 % 10/09/20 1119  Vitals shown include unvalidated device data.  Last Pain:  Vitals:   10/09/20 0930  TempSrc: Temporal  PainSc: 0-No pain         Complications: No notable events documented.

## 2020-10-09 NOTE — Progress Notes (Signed)
Preparing patient for discharge, up to chair to dress, blood tinged watery emesis x 1. Monitored in chair x 15 minutes, returned to stretcher x 20 mins. Daughter at bedside assisting with translation. Pt reports feeling better. Assisted pt to wheelchair, vomited rust colored liquid approx . Orders received from Dr Meridee Score, 39ml zofran given IV. Monitored x 30 minutes post IV zofran. Reports feeling better. Dr Meridee Score saw prior to discharge, he spoke with daughter, sent po zofran to pharmacy for prn use at home

## 2020-10-09 NOTE — Anesthesia Preprocedure Evaluation (Addendum)
Anesthesia Evaluation  Patient identified by MRN, date of birth, ID band Patient awake    Reviewed: Allergy & Precautions, NPO status , Patient's Chart, lab work & pertinent test results  Airway Mallampati: II  TM Distance: >3 FB Neck ROM: Full    Dental no notable dental hx. (+) Teeth Intact, Dental Advisory Given   Pulmonary neg pulmonary ROS,    Pulmonary exam normal breath sounds clear to auscultation       Cardiovascular hypertension, Pt. on medications Normal cardiovascular exam Rhythm:Regular Rate:Normal     Neuro/Psych negative neurological ROS  negative psych ROS   GI/Hepatic negative GI ROS, Neg liver ROS, Choledocholithiasis   Endo/Other  negative endocrine ROS  Renal/GU      Musculoskeletal negative musculoskeletal ROS (+)   Abdominal   Peds  Hematology Lab Results      Component                Value               Date                      WBC                      5.8                 09/09/2020                HGB                      11.1 (L)            09/09/2020                HCT                      34.3 (L)            09/09/2020                MCV                      88.8                09/09/2020                PLT                      198.0               09/09/2020              Anesthesia Other Findings All: Amoxicillin, clarithromycin, ASA  Reproductive/Obstetrics negative OB ROS                            Anesthesia Physical Anesthesia Plan  ASA: 3  Anesthesia Plan: General   Post-op Pain Management:    Induction: Intravenous  PONV Risk Score and Plan: 3 and Treatment may vary due to age or medical condition, Ondansetron and Dexamethasone  Airway Management Planned: Oral ETT  Additional Equipment: None  Intra-op Plan:   Post-operative Plan: Extubation in OR  Informed Consent: I have reviewed the patients History and Physical, chart, labs and  discussed the procedure including the risks, benefits and alternatives for the proposed anesthesia with the patient or authorized representative who has indicated his/her  understanding and acceptance.     Dental advisory given and Interpreter used for interveiw  Plan Discussed with:   Anesthesia Plan Comments:        Anesthesia Quick Evaluation

## 2020-10-11 ENCOUNTER — Encounter: Payer: Self-pay | Admitting: Family Medicine

## 2020-10-11 ENCOUNTER — Encounter (HOSPITAL_COMMUNITY): Payer: Self-pay | Admitting: Gastroenterology

## 2020-10-11 DIAGNOSIS — A048 Other specified bacterial intestinal infections: Secondary | ICD-10-CM | POA: Insufficient documentation

## 2020-10-11 LAB — SURGICAL PATHOLOGY

## 2020-10-15 ENCOUNTER — Ambulatory Visit: Payer: Medicare Other | Admitting: Family

## 2020-10-16 ENCOUNTER — Encounter: Payer: Self-pay | Admitting: Gastroenterology

## 2020-10-25 ENCOUNTER — Telehealth: Payer: Self-pay | Admitting: Gastroenterology

## 2020-10-25 NOTE — Telephone Encounter (Signed)
Patients daughter received a letter looking for path results.

## 2020-10-28 NOTE — Telephone Encounter (Signed)
Follow-up Encounters  10/21/2020 Letter (Out) 10/16/2020 Letter (Out)   Path results were included in letter mailed 10/16/20 along with a letter that was mailed 10/21/20 re: failed attempts to reach pt

## 2020-11-04 ENCOUNTER — Telehealth: Payer: Self-pay

## 2020-11-05 NOTE — Telephone Encounter (Signed)
Patient's pantoprazole has been approved 02/10/2020-02/08/2021. Patients daug and pharmacy have been informed.

## 2020-11-12 MED ORDER — PANTOPRAZOLE SODIUM 20 MG PO TBEC: 20.0000 mg | DELAYED_RELEASE_TABLET | Freq: Two times a day (BID) | ORAL | 2 refills | Status: DC

## 2020-11-12 NOTE — Addendum Note (Signed)
Addended byLucky Rathke on: 11/12/2020 02:05 PM   Modules accepted: Orders

## 2020-11-12 NOTE — Telephone Encounter (Signed)
Prescription for Pantoprazole 20mg  -BID has been re-sent to [facility] - . Pharmacy has been advised as of 11/05/20 that PA was done and approved thru 02/08/2021.

## 2020-11-12 NOTE — Telephone Encounter (Signed)
Inbound call from patient. States pharmacy does not have the prescription. [facility] on [address]

## 2020-11-15 ENCOUNTER — Ambulatory Visit (INDEPENDENT_AMBULATORY_CARE_PROVIDER_SITE_OTHER): Payer: Medicare Other | Admitting: Gastroenterology

## 2020-11-15 ENCOUNTER — Encounter: Payer: Self-pay | Admitting: Gastroenterology

## 2020-11-15 ENCOUNTER — Other Ambulatory Visit (INDEPENDENT_AMBULATORY_CARE_PROVIDER_SITE_OTHER): Payer: Medicare Other

## 2020-11-15 VITALS — BP 134/60 | HR 72 | Ht 60.5 in | Wt 101.0 lb

## 2020-11-15 DIAGNOSIS — K8050 Calculus of bile duct without cholangitis or cholecystitis without obstruction: Secondary | ICD-10-CM | POA: Diagnosis not present

## 2020-11-15 DIAGNOSIS — R1013 Epigastric pain: Secondary | ICD-10-CM | POA: Diagnosis not present

## 2020-11-15 DIAGNOSIS — Z8619 Personal history of other infectious and parasitic diseases: Secondary | ICD-10-CM

## 2020-11-15 DIAGNOSIS — Z1211 Encounter for screening for malignant neoplasm of colon: Secondary | ICD-10-CM

## 2020-11-15 LAB — COMPREHENSIVE METABOLIC PANEL
ALT: 24 U/L (ref 0–35)
AST: 20 U/L (ref 0–37)
Albumin: 4.3 g/dL (ref 3.5–5.2)
Alkaline Phosphatase: 42 U/L (ref 39–117)
BUN: 28 mg/dL — ABNORMAL HIGH (ref 6–23)
CO2: 32 mEq/L (ref 19–32)
Calcium: 9.2 mg/dL (ref 8.4–10.5)
Chloride: 100 mEq/L (ref 96–112)
Creatinine, Ser: 1.42 mg/dL — ABNORMAL HIGH (ref 0.40–1.20)
GFR: 36.13 mL/min — ABNORMAL LOW (ref >60.00)
Glucose, Bld: 120 mg/dL — ABNORMAL HIGH (ref 70–99)
Potassium: 3.8 mEq/L (ref 3.5–5.1)
Sodium: 141 mEq/L (ref 135–145)
Total Bilirubin: 0.4 mg/dL (ref 0.2–1.2)
Total Protein: 7.6 g/dL (ref 6.0–8.3)

## 2020-11-15 MED ORDER — PEPTO-BISMOL 524 MG/30ML PO SUSP: 30.0000 mL | Freq: Four times a day (QID) | ORAL | 0 refills | Status: DC

## 2020-11-15 MED ORDER — METRONIDAZOLE 500 MG PO TABS: 500.0000 mg | ORAL_TABLET | Freq: Three times a day (TID) | ORAL | 0 refills | Status: DC

## 2020-11-15 MED ORDER — TETRACYCLINE HCL 500 MG PO CAPS: 500.0000 mg | ORAL_CAPSULE | Freq: Four times a day (QID) | ORAL | 0 refills | Status: DC

## 2020-11-15 NOTE — Progress Notes (Signed)
[place] VISIT   Primary Care Provider Haydee Salter, MD [place] [id number] [phone]  Patient Profile: Becky Turner is a 75 y.o. female with a pmh significant for hypertension, hyperlipidemia, status post cholecystectomy (cholelithiasis requiring ERCP and stenting due to extensive stone burden and now all stones removed), H. pylori gastritis (eradication negative on for stool antigen but repeat gastric biopsies showed recurrence/persistence of infection).  The patient presents to the [facility] for an evaluation and management of problem(s) noted below:  Problem List 1. Choledocholithiasis   2. History of Helicobacter infection   3. Abdominal pain, epigastric   4. Colon cancer screening      History of Present Illness Please see initial consultation note for full details of HPI.    Interval History The patient returns with her daughter for scheduled follow-up.  Most recent ERCP is described below.  The biliary stent had migrated distally and caused a fistula into the duodenum though overt perforation not noted.  Final removal of all choledocholithiasis and spyglass insertion was performed to ensure large stone fragments were not present.  No biliary stents were left in place.  Repeat gastric biopsies were obtained due to existing gastric erythema.  Those biopsies showed persistence of H. pylori infection even though a recent stool antigen had been negative.  Our team was not able to get a hold of the patient or patient's daughter and a letter was sent through [place].  Patient never underwent repeat antibiotic therapy.  The patient does feel better in regards to her abdominal discomfort but is still experiencing at times episodes of midepigastric abdominal pain postprandially however overall is significantly better now that the stent has been removed.  The family and patient would like to move forward with a  colonoscopy but no sooner than January or February of this year.  No other alteration of bowel habits at this time.  GI Review of Systems Positive as above Negative for odynophagia, dysphagia, pyrosis, nausea, vomiting   Review of Systems General: Denies fevers/chills/unintentional weight loss Cardiovascular: Denies chest pain Pulmonary: Denies shortness of breath Gastroenterological: See HPI Genitourinary: Denies darkened urine Hematological: Denies easy bruising/bleeding Dermatological: Denies jaundice Psychological: Mood is stable   Medications Current Outpatient Medications  Medication Sig Dispense Refill   bismuth subsalicylate (PEPTO-BISMOL) 262 MG/15ML suspension Take 30 mLs by mouth 4 (four) times daily. 1700 mL 0   lisinopril-hydrochlorothiazide (ZESTORETIC) 20-25 MG tablet Take 1 tablet by mouth daily.     metroNIDAZOLE (FLAGYL) 500 MG tablet Take 1 tablet (500 mg total) by mouth 3 (three) times daily. 42 tablet 0   pantoprazole (PROTONIX) 20 MG tablet Take 1 tablet (20 mg total) by mouth 2 (two) times daily before a meal. 60 tablet 2   prednisoLONE acetate (PRED FORTE) 1 % ophthalmic suspension Place 1 drop into the right eye daily.     rosuvastatin (CRESTOR) 10 MG tablet Take 10 mg by mouth daily.     tetracycline (SUMYCIN) 500 MG capsule Take 1 capsule (500 mg total) by mouth 4 (four) times daily. 56 capsule 0   amLODipine (NORVASC) 5 MG tablet Take 5 mg by mouth daily. (Patient not taking: Reported on 11/15/2020)     metoprolol succinate (TOPROL-XL) 50 MG 24 hr tablet Take 50 mg by mouth daily. (Patient not taking: Reported on 11/15/2020)     No current facility-administered medications for this visit.    Allergies Allergies  Allergen Reactions   Aspirin Nausea And  Vomiting   Amoxicillin Rash   Clarithromycin Rash    Histories Past Medical History:  Diagnosis Date   Anemia    Hyperlipidemia, familial, high LDL 01/30/2019   Hypertension    Prediabetes     Past Surgical History:  Procedure Laterality Date   BILIARY DILATION  10/09/2020   Procedure: BILIARY DILATION;  Surgeon: Rush Landmark Telford Nab., MD;  Location: [facility];  Service: Gastroenterology;;   BILIARY STENT PLACEMENT  07/20/2020   Procedure: BILIARY STENT PLACEMENT;  Surgeon: Ladene Artist, MD;  Location: [place];  Service: Endoscopy;;   BILIARY STENT PLACEMENT  07/23/2020   Procedure: BILIARY STENT PLACEMENT;  Surgeon: Irving Copas., MD;  Location: [place];  Service: Gastroenterology;;   BIOPSY  07/23/2020   Procedure: BIOPSY;  Surgeon: Irving Copas., MD;  Location: [place];  Service: Gastroenterology;;   BIOPSY  10/09/2020   Procedure: BIOPSY;  Surgeon: Irving Copas., MD;  Location: [facility];  Service: Gastroenterology;;   CATARACT EXTRACTION, BILATERAL  2019   CHOLECYSTECTOMY N/A 07/24/2020   Procedure: LAPAROSCOPIC CHOLECYSTECTOMY;  Surgeon: Georganna Skeans, MD;  Location: [place];  Service: General;  Laterality: N/A;   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   ENDOSCOPIC RETROGRADE CHOLANGIOPANCREATOGRAPHY (ERCP) WITH PROPOFOL N/A 07/23/2020   Procedure: ENDOSCOPIC RETROGRADE CHOLANGIOPANCREATOGRAPHY (ERCP) WITH PROPOFOL;  Surgeon: Irving Copas., MD;  Location: [place];  Service: Gastroenterology;  Laterality: N/A;   ENDOSCOPIC RETROGRADE CHOLANGIOPANCREATOGRAPHY (ERCP) WITH PROPOFOL N/A 10/09/2020   Procedure: ENDOSCOPIC RETROGRADE CHOLANGIOPANCREATOGRAPHY (ERCP) WITH PROPOFOL;  Surgeon: Rush Landmark Telford Nab., MD;  Location: [facility];  Service: Gastroenterology;  Laterality: N/A;   ERCP N/A 07/20/2020   Procedure: ENDOSCOPIC RETROGRADE CHOLANGIOPANCREATOGRAPHY (ERCP);  Surgeon: Ladene Artist, MD;  Location: [facility];  Service: Endoscopy;  Laterality: N/A;   HEMOSTASIS CONTROL  07/20/2020   Procedure: HEMOSTASIS CONTROL;  Surgeon: Ladene Artist, MD;  Location: [place];  Service: Endoscopy;;   REMOVAL  OF STONES  07/20/2020   Procedure: REMOVAL OF STONES;  Surgeon: Ladene Artist, MD;  Location: [employer];  Service: Endoscopy;;   REMOVAL OF STONES  07/23/2020   Procedure: REMOVAL OF STONES;  Surgeon: Irving Copas., MD;  Location: [place];  Service: Gastroenterology;;   REMOVAL OF STONES  10/09/2020   Procedure: REMOVAL OF STONES;  Surgeon: Irving Copas., MD;  Location: [facility];  Service: Gastroenterology;;   Joan Mayans  07/20/2020   Procedure: Joan Mayans;  Surgeon: Ladene Artist, MD;  Location: [facility];  Service: Endoscopy;;   SPYGLASS CHOLANGIOSCOPY N/A 07/23/2020   Procedure: [id number] CHOLANGIOSCOPY;  Surgeon: Irving Copas., MD;  Location: [place];  Service: Gastroenterology;  Laterality: N/A;   SPYGLASS CHOLANGIOSCOPY N/A 10/09/2020   Procedure: SPYGLASS CHOLANGIOSCOPY;  Surgeon: Irving Copas., MD;  Location: [facility];  Service: Gastroenterology;  Laterality: N/A;   SPYGLASS LITHOTRIPSY N/A 07/23/2020   Procedure: [id number] LITHOTRIPSY;  Surgeon: Irving Copas., MD;  Location: [place];  Service: Gastroenterology;  Laterality: N/A;   STENT REMOVAL  07/23/2020   Procedure: STENT REMOVAL;  Surgeon: Irving Copas., MD;  Location: [place];  Service: Gastroenterology;;   [name] REMOVAL  10/09/2020   Procedure: STENT REMOVAL;  Surgeon: Irving Copas., MD;  Location: [facility];  Service: Gastroenterology;;   Social History   Socioeconomic History   Marital status: Married    Spouse name: Not on file   Number of children: 3   Years of education: Not on file   Highest education level: Not on  file  Occupational History   Occupation: retired  Tobacco Use   Smoking status: Never   Smokeless tobacco: Never  Vaping Use   Vaping Use: Never used  Substance and Sexual Activity   Alcohol use: No   Drug use: No   Sexual activity: Not Currently  Other Topics Concern   Not on file   Social History Narrative   Born in [place]. Moved to [place] about 1991.   Social Determinants of Health   Financial Resource Strain: Not on file  Food Insecurity: Not on file  Transportation Needs: Not on file  Physical Activity: Not on file  Stress: Not on file  Social Connections: Not on file  Intimate Partner Violence: Not on file   Family History  Problem Relation Age of Onset   Hypertension Mother    Hypertension Father    Diabetes Sister    Gallstones Other    Hyperlipidemia Daughter    Other Neg Hx    Colon cancer Neg Hx    Esophageal cancer Neg Hx    Inflammatory bowel disease Neg Hx    Liver disease Neg Hx    Pancreatic cancer Neg Hx    Rectal cancer Neg Hx    Stomach cancer Neg Hx    I have reviewed her medical, social, and family history in detail and updated the electronic medical record as necessary.    PHYSICAL EXAMINATION  BP 134/60 (BP Location: Left Arm, Patient Position: Sitting, Cuff Size: Normal)   Pulse 72   Ht 5' 0.5" (1.537 m)   Wt 101 lb (45.8 kg)   BMI 19.40 kg/m  Wt Readings from Last 3 Encounters:  11/15/20 101 lb (45.8 kg)  10/09/20 105 lb (47.6 kg)  09/10/20 102 lb 2 oz (46.3 kg)  GEN: NAD, appears stated age, doesn't appear chronically ill, accompanied by daughter PSYCH: Cooperative, without pressured speech EYE: Conjunctivae pink, sclerae anicteric ENT: MMM CV: Nontachycardic RESP: No audible wheezing GI: NABS, soft, laparoscopic surgical scars present, minimal tenderness to palpation in epigastric region, without rebound or guarding MSK/EXT: No lower extremity edema SKIN: No jaundice NEURO:  Alert & Oriented x 3, no focal deficits   REVIEW OF DATA  I reviewed the following data at the time of this encounter:  GI Procedures and Studies  August 2022 ERCP - Erythematous mucosa in the stomach - biopsied due to prior history of H. pylori. - No gross lesions in the duodenal bulb, in the first portion of the duodenum and in  the second portion of the duodenum. - The major papilla was on the rim of a diverticulum. - Prior biliary sphincterotomy appeared open. - The previously placed stent had migrated into the biliary tree. The major papilla appeared congested and ulcerated. 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 After careful maneuvering, I was able to remove the stent without further complication or worsening of the ulceration. - The entire main bile duct was severely dilated. - Sludge/stone fragments/air bubbles were found in the biliary tract upon entering and cholangiogram performance. - Choledocholithiasis was found. Complete removal was accomplished by sphincteroplasty, balloon sweeping. - I could not visualize the entire biliary tree due to the dilated duct, so I proceeded with Spyglass in effort of ensuring large stone fragments were not still present. These were not visualized up to the near bifurcation.  Pathology FINAL MICROSCOPIC DIAGNOSIS:  A. STOMACH, BIOPSY:  - Gastric  antral and oxyntic mucosa with Helicobacter pylori-associated  gastritis, see comment  COMMENT:  Immunohistochemical stain for Helicobacter pylori shows rare coccoid  forms.   Laboratory Studies  Reviewed those in epic  Imaging Studies  No new studies to review   ASSESSMENT  Becky Turner is a 75 y.o. female with a pmh significant for hypertension, hyperlipidemia, status post cholecystectomy (cholelithiasis requiring ERCP and stenting due to extensive stone burden and now all stones removed), H. pylori gastritis (eradication negative on for stool antigen but repeat gastric biopsies showed recurrence/persistence of infection).  The patient is seen today for evaluation and management of:  1. Choledocholithiasis   2. History of Helicobacter infection   3. Abdominal pain, epigastric   4. Colon cancer screening    The patient is  hemodynamically stable.  Clinically she is continuing to show improvement.  She still has an untreated persistent H. pylori infection.  We will go over the antibiotic treatment with the patient's daughter to ensure that this is completed appropriately.  Due to the issues with her prior antibiotics we will need to my a mindful of monitoring for any signs or symptoms of reactions to any of the medications.  I suspect some of her abdominal discomfort is still an issue of H. pylori as she did have clinical improvement status post the biliary stent being removed.  We will see how the patient does with treatment and see her back in follow-up.  We will plan a colonoscopy for colon cancer screening in early 2023.  Time will tell with how she does but hopefully she will have continued improvement.  All patient questions were answered to the best of my ability, and the patient agrees to the aforementioned plan of action with follow-up as indicated.   PLAN  Proceed with refractory H. pylori treatment -Protonix 20 mg twice daily x14 days -Bismuth 524 mg 4 times daily x14 days -Tetracycline 500 mg 4 times daily x14 days -Flagyl 500 mg 3 times daily x14 days May then stop Protonix Proceed with giving H. pylori stool antigen at that point Once H. pylori stool antigen has been given restart Protonix 20 mg daily Colonoscopy for screening in January/February 2023 CMP today to evaluate LFTs post stent removal and choledocholithiasis removal   Orders Placed This Encounter  Procedures   Helicobacter pylori special antigen   Comp Met (CMET)     New Prescriptions   BISMUTH SUBSALICYLATE (PEPTO-BISMOL) 262 MG/15ML SUSPENSION    Take 30 mLs by mouth 4 (four) times daily.   METRONIDAZOLE (FLAGYL) 500 MG TABLET    Take 1 tablet (500 mg total) by mouth 3 (three) times daily.   TETRACYCLINE (SUMYCIN) 500 MG CAPSULE    Take 1 capsule (500 mg total) by mouth 4 (four) times daily.   Modified Medications   No  medications on file    Planned Follow Up No follow-ups on file.   Total Time in Face-to-Face and in Coordination of Care for patient including independent/personal interpretation/review of prior testing, medical history, examination, medication adjustment, communicating results with the patient directly, and documentation within the EHR is 25 minutes.   Justice Britain, MD [facility] Office # [phone]

## 2020-11-15 NOTE — Patient Instructions (Addendum)
Take Pantoprazole  20 mg twice daily for 14 days Take Bismuth 262mg  4 times daily for 14 days Take Tetracycline 500 mg 4 times daily for 14 days Take Flagyl 500 mg 3 times daily for 14 days  We have sent the following medications to your pharmacy for you to pick up at your convenience: Bismuth, Tetracycline, Flagyl  After finishing treatment for H.pylori infection . Stop Pantoprazole for 2 weeks, then submit stool study for H.plyori.   Your provider has requested that you go to the basement level for lab work before leaving today. Press "B" on the elevator. The lab is located at the first door on the left as you exit the elevator.  Due to recent changes in healthcare laws, you may see the results of your imaging and laboratory studies on MyChart before your provider has had a chance to review them.  We understand that in some cases there may be results that are confusing or concerning to you. Not all laboratory results come back in the same time frame and the provider may be waiting for multiple results in order to interpret others.  Please give 48 hours in order for your provider to thoroughly review all the results before contacting the office for clarification of your results.   Thank you for choosing me and [facility].  Dr. [place]

## 2020-11-17 DIAGNOSIS — R1013 Epigastric pain: Secondary | ICD-10-CM | POA: Insufficient documentation

## 2020-11-17 NOTE — Progress Notes (Deleted)
No show

## 2020-12-10 ENCOUNTER — Ambulatory Visit: Payer: Medicare Other | Admitting: Family Medicine

## 2020-12-19 ENCOUNTER — Other Ambulatory Visit: Payer: Medicare Other

## 2020-12-19 DIAGNOSIS — R1013 Epigastric pain: Secondary | ICD-10-CM | POA: Diagnosis not present

## 2020-12-19 DIAGNOSIS — Z8619 Personal history of other infectious and parasitic diseases: Secondary | ICD-10-CM | POA: Diagnosis not present

## 2020-12-19 DIAGNOSIS — Z1211 Encounter for screening for malignant neoplasm of colon: Secondary | ICD-10-CM

## 2020-12-19 DIAGNOSIS — K8050 Calculus of bile duct without cholangitis or cholecystitis without obstruction: Secondary | ICD-10-CM | POA: Diagnosis not present

## 2020-12-20 LAB — HELICOBACTER PYLORI  SPECIAL ANTIGEN
MICRO NUMBER:: 12621088
SPECIMEN QUALITY: ADEQUATE

## 2020-12-21 DIAGNOSIS — Z23 Encounter for immunization: Secondary | ICD-10-CM | POA: Diagnosis not present

## 2021-01-04 DIAGNOSIS — Z23 Encounter for immunization: Secondary | ICD-10-CM | POA: Diagnosis not present

## 2021-01-06 DIAGNOSIS — H43313 Vitreous membranes and strands, bilateral: Secondary | ICD-10-CM | POA: Diagnosis not present

## 2021-01-06 DIAGNOSIS — Z961 Presence of intraocular lens: Secondary | ICD-10-CM | POA: Diagnosis not present

## 2021-01-06 DIAGNOSIS — H35362 Drusen (degenerative) of macula, left eye: Secondary | ICD-10-CM | POA: Diagnosis not present

## 2021-01-06 DIAGNOSIS — Z947 Corneal transplant status: Secondary | ICD-10-CM | POA: Diagnosis not present

## 2021-01-17 ENCOUNTER — Ambulatory Visit (INDEPENDENT_AMBULATORY_CARE_PROVIDER_SITE_OTHER): Payer: Medicare Other | Admitting: Family Medicine

## 2021-01-17 ENCOUNTER — Other Ambulatory Visit: Payer: Self-pay

## 2021-01-17 ENCOUNTER — Encounter: Payer: Self-pay | Admitting: Family Medicine

## 2021-01-17 VITALS — BP 124/70 | HR 74 | Temp 96.5°F | Ht 60.5 in | Wt 106.4 lb

## 2021-01-17 DIAGNOSIS — Z13820 Encounter for screening for osteoporosis: Secondary | ICD-10-CM

## 2021-01-17 DIAGNOSIS — I10 Essential (primary) hypertension: Secondary | ICD-10-CM | POA: Diagnosis not present

## 2021-01-17 DIAGNOSIS — D649 Anemia, unspecified: Secondary | ICD-10-CM

## 2021-01-17 DIAGNOSIS — R7303 Prediabetes: Secondary | ICD-10-CM | POA: Diagnosis not present

## 2021-01-17 DIAGNOSIS — Z780 Asymptomatic menopausal state: Secondary | ICD-10-CM

## 2021-01-17 DIAGNOSIS — E7849 Other hyperlipidemia: Secondary | ICD-10-CM | POA: Diagnosis not present

## 2021-01-17 DIAGNOSIS — K8050 Calculus of bile duct without cholangitis or cholecystitis without obstruction: Secondary | ICD-10-CM

## 2021-01-17 LAB — CBC
HCT: 30.1 % — ABNORMAL LOW (ref 36.0–46.0)
Hemoglobin: 9.7 g/dL — ABNORMAL LOW (ref 12.0–15.0)
MCHC: 32.3 g/dL (ref 30.0–36.0)
MCV: 89.7 fl (ref 78.0–100.0)
Platelets: 208 10*3/uL (ref 150.0–400.0)
RBC: 3.35 Mil/uL — ABNORMAL LOW (ref 3.87–5.11)
RDW: 14.7 % (ref 11.5–15.5)
WBC: 6.6 10*3/uL (ref 4.0–10.5)

## 2021-01-17 LAB — HEMOGLOBIN A1C: Hgb A1c MFr Bld: 6.1 % (ref 4.6–6.5)

## 2021-01-17 MED ORDER — ROSUVASTATIN CALCIUM 20 MG PO TABS: 20.0000 mg | ORAL_TABLET | Freq: Every day | ORAL | 3 refills | Status: DC

## 2021-01-17 NOTE — Progress Notes (Signed)
[facility] [address] [place] [address] Dept: [phone] Dept Fax: [phone]  Chronic Care Office Visit  Subjective:    Patient ID: Becky Turner, female    DOB: [birth year], 75 y.o..   MRN: [id number]  Chief Complaint  Patient presents with   Follow-up    F/u HTN/meds.  No concerns.     History of Present Illness:  Patient is in today for reassessment of chronic medical issues. Interpretation was provided by her daughter, Becky Turner. It is [name]'s preference to have her daughter fulfill this role.  [name] had an extended history related to gallstones, with repeated ERCP and stone removal. She had a cholecystectomy in June 2022. She had a 2nd ERCP with stent removal and stone removal on 8/31. Att this point,s he notes she is no longer having pain, is eating well, and having a normal bowel movement each day. She did have some mild post-operative anemia. Dr. Rush Landmark plans colonoscopy in early 2023 for colon cancer screening.   [name] has a history of hypertension. She is managed on Zestoretic (lisinopril/HCTZ) and Toprol XL.    [name] has a history of hyperlipidemia. She is currently on Crestor 10 mg daily. I had tried to communicate the need to increase her dose based on her labs after her last visit, but apparently she has not yet made that change.   [name] was noted to have an elevated blood sugar on multiple occasions during her hospitalizations.  Her last A1c was normal, but she remains concerned.  Past Medical History: Patient Active Problem List   Diagnosis Date Noted   Abdominal pain, epigastric 11/17/2020   Bacterial infection due to H. 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   Elevated LFTs    Choledocholithiasis 07/19/2020   Chronic kidney disease,  stage 3a ([place]) 07/19/2020   Hyperlipidemia, familial, high LDL 01/30/2019   Xanthelasma of eyelid 09/20/2015   Essential hypertension 09/10/2015   Dizziness and giddiness 09/10/2015   Insomnia 09/10/2015   Prediabetes 09/10/2015   Past Surgical History:  Procedure Laterality Date   BILIARY DILATION  10/09/2020   Procedure: BILIARY DILATION;  Surgeon: Irving Copas., MD;  Location: [facility];  Service: Gastroenterology;;   BILIARY STENT PLACEMENT  07/20/2020   Procedure: BILIARY STENT PLACEMENT;  Surgeon: Ladene Artist, MD;  Location: [place];  Service: Endoscopy;;   BILIARY STENT PLACEMENT  07/23/2020   Procedure: BILIARY STENT PLACEMENT;  Surgeon: Irving Copas., MD;  Location: [place];  Service: Gastroenterology;;   BIOPSY  07/23/2020   Procedure: BIOPSY;  Surgeon: Irving Copas., MD;  Location: [place];  Service: Gastroenterology;;   BIOPSY  10/09/2020   Procedure: BIOPSY;  Surgeon: Irving Copas., MD;  Location: [facility];  Service: Gastroenterology;;   CATARACT EXTRACTION, BILATERAL  2019   CHOLECYSTECTOMY N/A 07/24/2020   Procedure: LAPAROSCOPIC CHOLECYSTECTOMY;  Surgeon: Georganna Skeans, MD;  Location: [place];  Service: General;  Laterality: N/A;   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   ENDOSCOPIC RETROGRADE CHOLANGIOPANCREATOGRAPHY (ERCP) WITH PROPOFOL N/A 07/23/2020   Procedure: ENDOSCOPIC RETROGRADE CHOLANGIOPANCREATOGRAPHY (ERCP) WITH PROPOFOL;  Surgeon: Irving Copas., MD;  Location: [place];  Service: Gastroenterology;  Laterality: N/A;   ENDOSCOPIC RETROGRADE CHOLANGIOPANCREATOGRAPHY (ERCP) WITH PROPOFOL N/A 10/09/2020   Procedure: ENDOSCOPIC RETROGRADE CHOLANGIOPANCREATOGRAPHY (ERCP) WITH PROPOFOL;  Surgeon:  Mansouraty, Telford Nab., MD;  Location: [facility];  Service: Gastroenterology;  Laterality: N/A;   ERCP N/A 07/20/2020   Procedure: ENDOSCOPIC RETROGRADE CHOLANGIOPANCREATOGRAPHY (ERCP);  Surgeon: Ladene Artist, MD;  Location: [facility];  Service: Endoscopy;  Laterality: N/A;   HEMOSTASIS CONTROL  07/20/2020   Procedure: HEMOSTASIS CONTROL;  Surgeon: Ladene Artist, MD;  Location: [place];  Service: Endoscopy;;   REMOVAL OF STONES  07/20/2020   Procedure: REMOVAL OF STONES;  Surgeon: Ladene Artist, MD;  Location: [place];  Service: Endoscopy;;   REMOVAL OF STONES  07/23/2020   Procedure: REMOVAL OF STONES;  Surgeon: Irving Copas., MD;  Location: [place];  Service: Gastroenterology;;   REMOVAL OF STONES  10/09/2020   Procedure: REMOVAL OF STONES;  Surgeon: Irving Copas., MD;  Location: [facility];  Service: Gastroenterology;;   Joan Mayans  07/20/2020   Procedure: Joan Mayans;  Surgeon: Ladene Artist, MD;  Location: [facility];  Service: Endoscopy;;   SPYGLASS CHOLANGIOSCOPY N/A 07/23/2020   Procedure: [id number] CHOLANGIOSCOPY;  Surgeon: Irving Copas., MD;  Location: [place];  Service: Gastroenterology;  Laterality: N/A;   SPYGLASS CHOLANGIOSCOPY N/A 10/09/2020   Procedure: SPYGLASS CHOLANGIOSCOPY;  Surgeon: Irving Copas., MD;  Location: [facility];  Service: Gastroenterology;  Laterality: N/A;   SPYGLASS LITHOTRIPSY N/A 07/23/2020   Procedure: [id number] LITHOTRIPSY;  Surgeon: Irving Copas., MD;  Location: [place];  Service: Gastroenterology;  Laterality: N/A;   STENT REMOVAL  07/23/2020   Procedure: STENT REMOVAL;  Surgeon: Rush Landmark Telford Nab., MD;  Location: [place];  Service: Gastroenterology;;   [name] REMOVAL  10/09/2020   Procedure: STENT REMOVAL;  Surgeon: Irving Copas., MD;  [facility]: [facility];  Service: Gastroenterology;;   Family History  Problem Relation Age of Onset   Hypertension Mother    Hypertension Father    Diabetes Sister    Gallstones Other    Hyperlipidemia Daughter    Other Neg Hx    Colon cancer Neg Hx    Esophageal cancer Neg Hx    Inflammatory  bowel disease Neg Hx    Liver disease Neg Hx    Pancreatic cancer Neg Hx    Rectal cancer Neg Hx    Stomach cancer Neg Hx    Outpatient Medications Prior to Visit  Medication Sig Dispense Refill   lisinopril-hydrochlorothiazide (ZESTORETIC) 20-25 MG tablet Take 1 tablet by mouth daily.     pantoprazole (PROTONIX) 20 MG tablet Take 1 tablet (20 mg total) by mouth 2 (two) times daily before a meal. 60 tablet 2   prednisoLONE acetate (PRED FORTE) 1 % ophthalmic suspension Place 1 drop into the right eye daily.     rosuvastatin (CRESTOR) 10 MG tablet Take 10 mg by mouth daily.     amLODipine (NORVASC) 5 MG tablet Take 5 mg by mouth daily. (Patient not taking: Reported on 11/15/2020)     bismuth subsalicylate (PEPTO-BISMOL) 262 MG/15ML suspension Take 30 mLs by mouth 4 (four) times daily. (Patient not taking: Reported on 01/17/2021) 1700 mL 0   metoprolol succinate (TOPROL-XL) 50 MG 24 hr tablet Take 50 mg by mouth daily. (Patient not taking: Reported on 11/15/2020)     metroNIDAZOLE (FLAGYL) 500 MG tablet Take 1 tablet (500 mg total) by mouth 3 (three) times daily. 42 tablet 0   tetracycline (SUMYCIN) 500 MG capsule Take 1 capsule (500 mg total) by mouth 4 (four) times daily. 56 capsule 0   No facility-administered medications prior to visit.  Allergies  Allergen Reactions   Aspirin Nausea And Vomiting   Amoxicillin Rash   Clarithromycin Rash    Objective:   Today's Vitals   01/17/21 1321  BP: 124/70  Pulse: 74  Temp: (!) 96.5 F (35.8 C)  TempSrc: Temporal  SpO2: 99%  Weight: 106 lb 6.4 oz (48.3 kg)  Height: 5' 0.5" (1.537 m)   Body mass index is 20.44 kg/m.   General: Well developed, well nourished. No acute distress. Psych: Alert and oriented. Normal mood and affect.  Health Maintenance Due  Topic Date Due   TETANUS/TDAP  Never done   Zoster Vaccines- Shingrix (1 of 2) Never done   COLONOSCOPY (Pts 45-9yrs Insurance coverage will need to be confirmed)  Never done    DEXA SCAN  Never done   Lab Results Last CBC Lab Results  Component Value Date   WBC 5.8 09/09/2020   HGB 11.1 (L) 09/09/2020   HCT 34.3 (L) 09/09/2020   MCV 88.8 09/09/2020   MCH 28.5 07/25/2020   RDW 14.3 09/09/2020   PLT 198.0 09/09/2020   Last metabolic panel Lab Results  Component Value Date   GLUCOSE 120 (H) 11/15/2020   NA 141 11/15/2020   K 3.8 11/15/2020   CL 100 11/15/2020   CO2 32 11/15/2020   BUN 28 (H) 11/15/2020   CREATININE 1.42 (H) 11/15/2020   GFRNONAA 39 (L) 07/25/2020   CALCIUM 9.2 11/15/2020   PROT 7.6 11/15/2020   ALBUMIN 4.3 11/15/2020   LABGLOB 2.9 12/19/2018   AGRATIO 1.5 12/19/2018   BILITOT 0.4 11/15/2020   ALKPHOS 42 11/15/2020   AST 20 11/15/2020   ALT 24 11/15/2020   ANIONGAP 7 07/25/2020   Last lipids Lab Results  Component Value Date   CHOL 225 (H) 09/09/2020   HDL 70.30 09/09/2020   LDLCALC 135 (H) 09/09/2020   TRIG 99.0 09/09/2020   CHOLHDL 3 09/09/2020     Lab Results  Component Value Date   HGBA1C 5.8 09/09/2020     Assessment & Plan:   1. Essential hypertension Blood pressure is at goal. Continue Zestoretic and metoprolol  2. Choledocholithiasis Resolved and patient doing much better. I will recheck CBC to make sure her anemia is resolved.  - CBC  3. Hyperlipidemia, familial, high LDL I reviewed the previous lipid results. We will go ahead and increase her Crestor to 20 mg daily. We will repeat her lipids in 3 months.  - rosuvastatin (CRESTOR) 20 MG tablet; Take 1 tablet (20 mg total) by mouth daily.  Dispense: 90 tablet; Refill: 3  4. Prediabetes We will repeat her A1c.  - Hemoglobin A1c  5. Screening for osteoporosis 6. Asymptomatic menopausal state  Had previously ordered a DXA scan. I will renew the order to see if we can get this completed.  - DG Bone Density; Future   Loyola Mast, MD

## 2021-01-17 NOTE — Addendum Note (Signed)
Addended by: Loyola Mast on: 01/17/2021 06:15 PM   Modules accepted: Orders

## 2021-01-19 ENCOUNTER — Other Ambulatory Visit: Payer: Self-pay | Admitting: Cardiology

## 2021-01-19 DIAGNOSIS — I10 Essential (primary) hypertension: Secondary | ICD-10-CM

## 2021-01-20 NOTE — Progress Notes (Signed)
Called and spoke to patient's daughter [name] [facility]), scheduled and confirmed lab appt for 01/22/21 @8am , no fasting needed

## 2021-01-22 ENCOUNTER — Other Ambulatory Visit (INDEPENDENT_AMBULATORY_CARE_PROVIDER_SITE_OTHER): Payer: Medicare Other

## 2021-01-22 ENCOUNTER — Ambulatory Visit: Payer: Medicare Other | Admitting: Cardiology

## 2021-01-22 ENCOUNTER — Other Ambulatory Visit: Payer: Self-pay

## 2021-01-22 DIAGNOSIS — D649 Anemia, unspecified: Secondary | ICD-10-CM | POA: Diagnosis not present

## 2021-01-23 ENCOUNTER — Encounter: Payer: Self-pay | Admitting: Family Medicine

## 2021-01-23 LAB — IRON,TIBC AND FERRITIN PANEL
%SAT: 26 % (calc) (ref 16–45)
Ferritin: 348 ng/mL — ABNORMAL HIGH (ref 16–288)
Iron: 88 ug/dL (ref 45–160)
TIBC: 339 mcg/dL (calc) (ref 250–450)

## 2021-01-23 NOTE — Addendum Note (Signed)
Addended by: Loyola Mast on: 01/23/2021 11:17 AM   Modules accepted: Orders

## 2021-01-29 ENCOUNTER — Encounter: Payer: Self-pay | Admitting: Family Medicine

## 2021-02-21 ENCOUNTER — Other Ambulatory Visit: Payer: Self-pay

## 2021-02-21 ENCOUNTER — Encounter: Payer: Self-pay | Admitting: Family Medicine

## 2021-02-21 ENCOUNTER — Ambulatory Visit (INDEPENDENT_AMBULATORY_CARE_PROVIDER_SITE_OTHER)
Admission: RE | Admit: 2021-02-21 | Discharge: 2021-02-21 | Disposition: A | Discharge status: Home / Self Care | Payer: Medicare Other | Source: Ambulatory Visit | Attending: Family Medicine | Admitting: Family Medicine

## 2021-02-21 DIAGNOSIS — Z13820 Encounter for screening for osteoporosis: Secondary | ICD-10-CM

## 2021-02-21 DIAGNOSIS — Z780 Asymptomatic menopausal state: Secondary | ICD-10-CM

## 2021-02-21 DIAGNOSIS — M8580 Other specified disorders of bone density and structure, unspecified site: Secondary | ICD-10-CM | POA: Insufficient documentation

## 2021-02-21 DIAGNOSIS — M810 Age-related osteoporosis without current pathological fracture: Secondary | ICD-10-CM | POA: Insufficient documentation

## 2021-02-24 ENCOUNTER — Other Ambulatory Visit: Payer: Self-pay | Admitting: Cardiology

## 2021-02-25 ENCOUNTER — Other Ambulatory Visit: Payer: Self-pay

## 2021-02-26 ENCOUNTER — Ambulatory Visit (INDEPENDENT_AMBULATORY_CARE_PROVIDER_SITE_OTHER): Payer: Medicare Other | Admitting: Family Medicine

## 2021-02-26 VITALS — BP 124/70 | HR 65 | Temp 98.0°F | Ht 60.5 in | Wt 109.6 lb

## 2021-02-26 DIAGNOSIS — D649 Anemia, unspecified: Secondary | ICD-10-CM

## 2021-02-26 DIAGNOSIS — M85859 Other specified disorders of bone density and structure, unspecified thigh: Secondary | ICD-10-CM | POA: Diagnosis not present

## 2021-02-26 LAB — CBC
HCT: 32.7 % — ABNORMAL LOW (ref 36.0–46.0)
Hemoglobin: 10.6 g/dL — ABNORMAL LOW (ref 12.0–15.0)
MCHC: 32.4 g/dL (ref 30.0–36.0)
MCV: 89.5 fl (ref 78.0–100.0)
Platelets: 185 10*3/uL (ref 150.0–400.0)
RBC: 3.66 Mil/uL — ABNORMAL LOW (ref 3.87–5.11)
RDW: 12.7 % (ref 11.5–15.5)
WBC: 5.6 10*3/uL (ref 4.0–10.5)

## 2021-02-26 NOTE — Patient Instructions (Signed)
Take Os-Cal Plus D3 Supplement (500mg  calcium, 200 IU Vitamin D3) two caplets a day.

## 2021-02-26 NOTE — Progress Notes (Signed)
[facility] [place] [facility] [address] [place] [address] Dept: [phone] Dept Fax: [phone]  Office Visit  Subjective:    Patient ID: Becky Turner, female    DOB: 07-17-1945, 76 y.o..   MRN: [id number]  Chief Complaint  Patient presents with   Follow-up    F/u labs.      History of Present Illness:  Patient is in today for follow-up of her recent lab studies.  At her last visit, we had checked a CBC to follow-up on some mild post-operative anemia after her cholecystectomy and 2 ERCPs last summer. She had not noted any specific blood loss.  Additionally, she had been referred for a bone density test. She has some episodic low back pain. He daughter had noted some kyphoisis with time.  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   Elevated LFTs    Choledocholithiasis 07/19/2020   Chronic kidney disease, stage 3a ([place]) 07/19/2020   Hyperlipidemia, familial, high LDL 01/30/2019   Xanthelasma of eyelid 09/20/2015   Essential hypertension 09/10/2015   Dizziness and giddiness 09/10/2015   Insomnia 09/10/2015   Prediabetes 09/10/2015   Past Surgical History:  Procedure Laterality Date   BILIARY DILATION  10/09/2020   Procedure: BILIARY DILATION;  Surgeon: Irving Copas., MD;  Location: [facility];  Service: Gastroenterology;;   BILIARY STENT PLACEMENT  07/20/2020   Procedure: BILIARY STENT PLACEMENT;  Surgeon: Ladene Artist, MD;  Location: [place];  Service: Endoscopy;;   BILIARY STENT PLACEMENT  07/23/2020   Procedure: BILIARY STENT PLACEMENT;  Surgeon: Irving Copas., MD;  Location: [place];  Service:  Gastroenterology;;   BIOPSY  07/23/2020   Procedure: BIOPSY;  Surgeon: Irving Copas., MD;  Location: [place];  Service: Gastroenterology;;   BIOPSY  10/09/2020   Procedure: BIOPSY;  Surgeon: Irving Copas., MD;  [facility]: [facility];  Service: Gastroenterology;;   CATARACT EXTRACTION, BILATERAL  2019   CHOLECYSTECTOMY N/A 07/24/2020   Procedure: LAPAROSCOPIC CHOLECYSTECTOMY;  Surgeon: Georganna Skeans, MD;  Location: [place];  Service: General;  Laterality: N/A;   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   ENDOSCOPIC RETROGRADE CHOLANGIOPANCREATOGRAPHY (ERCP) WITH PROPOFOL N/A 07/23/2020   Procedure: ENDOSCOPIC RETROGRADE CHOLANGIOPANCREATOGRAPHY (ERCP) WITH PROPOFOL;  Surgeon: Irving Copas., MD;  Location: [place];  Service: Gastroenterology;  Laterality: N/A;   ENDOSCOPIC RETROGRADE CHOLANGIOPANCREATOGRAPHY (ERCP) WITH PROPOFOL N/A 10/09/2020   Procedure: ENDOSCOPIC RETROGRADE CHOLANGIOPANCREATOGRAPHY (ERCP) WITH PROPOFOL;  Surgeon: Rush Landmark Telford Nab., MD;  Location: [facility];  Service: Gastroenterology;  Laterality: N/A;   ERCP N/A 07/20/2020   Procedure: ENDOSCOPIC RETROGRADE CHOLANGIOPANCREATOGRAPHY (ERCP);  Surgeon: Ladene Artist, MD;  Location: [facility];  Service: Endoscopy;  Laterality: N/A;   HEMOSTASIS CONTROL  07/20/2020   Procedure: HEMOSTASIS CONTROL;  Surgeon: Ladene Artist, MD;  Location: [place];  Service: Endoscopy;;   REMOVAL OF STONES  07/20/2020   Procedure: REMOVAL OF STONES;  Surgeon: Ladene Artist, MD;  Location: [place];  Service: Endoscopy;;   REMOVAL OF STONES  07/23/2020   Procedure: REMOVAL OF STONES;  Surgeon: Irving Copas., MD;  Location: [place];  Service: Gastroenterology;;   REMOVAL OF STONES  10/09/2020   Procedure: REMOVAL OF STONES;  Surgeon: Irving Copas., MD;  Location: [facility];  Service: Gastroenterology;;   Joan Mayans  07/20/2020   Procedure: Joan Mayans;   Surgeon: Ladene Artist, MD;  Location: [facility];  Service: Endoscopy;;   SPYGLASS CHOLANGIOSCOPY N/A 07/23/2020   Procedure: [id number] CHOLANGIOSCOPY;  Surgeon: Irving Copas., MD;  Location: [place];  Service: Gastroenterology;  Laterality: N/A;   SPYGLASS CHOLANGIOSCOPY N/A 10/09/2020   Procedure: SPYGLASS CHOLANGIOSCOPY;  Surgeon: Irving Copas., MD;  Location: [facility];  Service: Gastroenterology;  Laterality: N/A;   SPYGLASS LITHOTRIPSY N/A 07/23/2020   Procedure: [id number] LITHOTRIPSY;  Surgeon: Irving Copas., MD;  Location: [place];  Service: Gastroenterology;  Laterality: N/A;   STENT REMOVAL  07/23/2020   Procedure: STENT REMOVAL;  Surgeon: Rush Landmark Telford Nab., MD;  Location: [place];  Service: Gastroenterology;;   [name] REMOVAL  10/09/2020   Procedure: STENT REMOVAL;  Surgeon: Irving Copas., MD;  Location: [facility];  Service: Gastroenterology;;   Family History  Problem Relation Age of Onset   Hypertension Mother    Hypertension Father    Diabetes Sister    Gallstones Other    Hyperlipidemia Daughter    Other Neg Hx    Colon cancer Neg Hx    Esophageal cancer Neg Hx    Inflammatory bowel disease Neg Hx    Liver disease Neg Hx    Pancreatic cancer Neg Hx    Rectal cancer Neg Hx    Stomach cancer Neg Hx    Outpatient Medications Prior to Visit  Medication Sig Dispense Refill   lisinopril-hydrochlorothiazide (ZESTORETIC) 20-25 MG tablet Take 1 tablet by mouth daily.     pantoprazole (PROTONIX) 20 MG tablet Take 1 tablet (20 mg total) by mouth 2 (two) times daily before a meal. 60 tablet 2   prednisoLONE acetate (PRED FORTE) 1 % ophthalmic suspension Place 1 drop into the right eye daily.     rosuvastatin (CRESTOR) 10 MG tablet TAKE 1 TABLET BY MOUTH EVERY NIGHT 90 tablet 1   rosuvastatin (CRESTOR) 20 MG tablet Take 1 tablet (20 mg total) by mouth daily. 90 tablet 3   No facility-administered medications  prior to visit.   Allergies  Allergen Reactions   Aspirin Nausea And Vomiting   Amoxicillin Rash   Clarithromycin Rash   Objective:   Today's Vitals   02/26/21 1132  BP: 124/70  Pulse: 65  Temp: 98 F (36.7 C)  TempSrc: Temporal  SpO2: 98%  Weight: 109 lb 9.6 oz (49.7 kg)  Height: 5' 0.5" (1.537 m)   Body mass index is 21.05 kg/m.   General: Well developed, well nourished. No acute distress. Psych: Alert and oriented. Normal mood and affect.  Health Maintenance Due  Topic Date Due   TETANUS/TDAP  Never done   Zoster Vaccines- Shingrix (1 of 2) Never done   Lab Results Last CBC Lab Results  Component Value Date   WBC 6.6 01/17/2021   HGB 9.7 (L) 01/17/2021   HCT 30.1 (L) 01/17/2021   MCV 89.7 01/17/2021   MCH 28.5 07/25/2020   RDW 14.7 01/17/2021   PLT 208.0 01/17/2021  Iron/TIBC/Ferritin/ %Sat    Component Value Date/Time   IRON 88 01/22/2021 0757   TIBC 339 01/22/2021 0757   FERRITIN 348 (H) 01/22/2021 0757   IRONPCTSAT 26 01/22/2021 0757   Imaging: Bone Density (02/21/2021)   Results:   Lumbar spine L1-L4 Femoral neck (FN)  T-score   0.4 RFN: -2.1 LFN: -1.8    Assessment: By the  WHO Criteria for diagnosis based on bone density, this patient has Low Bone Density   FRAX 10-year fracture risk calculator: 7.4 % for any major fracture and 2.1 % for hip fracture. Pharmacologic therapy is recommended if 10 year fracture risk is >20% for any major osteoporotic fracture or >3% for hip fracture.      Assessment & Plan:   1. Normocytic anemia The cause of Ms. Humber's anemia is uncertain. I will repeat her CBC today to make sure she isn't having worsening anemia. She is scheduled to see Dr. Rush Landmark on 1/27. He had planned for her to eventually have a colonoscopy. I feel they should move ahead with this.  - CBC  2. Osteopenia of neck of femur, unspecified laterality We reviewed the results of the bone density study with the finding of osteopenia. I  recommend Ms. Dakin start taking a daily calcium supplement with Vit. D. We will repeat the bone density study in 2 years.  Haydee Salter, MD

## 2021-03-07 ENCOUNTER — Ambulatory Visit (INDEPENDENT_AMBULATORY_CARE_PROVIDER_SITE_OTHER): Payer: Medicare Other | Admitting: Gastroenterology

## 2021-03-07 ENCOUNTER — Encounter: Payer: Self-pay | Admitting: Gastroenterology

## 2021-03-07 VITALS — BP 124/58 | HR 64 | Ht 60.5 in | Wt 109.6 lb

## 2021-03-07 DIAGNOSIS — D649 Anemia, unspecified: Secondary | ICD-10-CM | POA: Diagnosis not present

## 2021-03-07 DIAGNOSIS — Z1211 Encounter for screening for malignant neoplasm of colon: Secondary | ICD-10-CM

## 2021-03-07 DIAGNOSIS — Z8619 Personal history of other infectious and parasitic diseases: Secondary | ICD-10-CM

## 2021-03-07 NOTE — Patient Instructions (Addendum)
You have been referred to [facility] Hematology for Anemia. Their office will contact you to schedule an appointment. If you have not heard from their office in 1-2 weeks please contact their office.   Your provider has requested that you go to the basement level for lab work before leaving today. Press "B" on the elevator. The lab is located at the first door on the left as you exit the elevator.   If you are age 76 or older, your body mass index should be between 23-30. Your Body mass index is 21.05 kg/m. If this is out of the aforementioned range listed, please consider follow up with your Primary Care Provider.  If you are age 24 or younger, your body mass index should be between 19-25. Your Body mass index is 21.05 kg/m. If this is out of the aformentioned range listed, please consider follow up with your Primary Care Provider.   ________________________________________________________  The [facility] providers would like to encourage you to use [facility] to communicate with providers for non-urgent requests or questions.  Due to long hold times on the telephone, sending your provider a message by [facility] may be a faster and more efficient way to get a response.  Please allow 48 business hours for a response.  Please remember that this is for non-urgent requests.  _______________________________________________________  Thank you for choosing me and [facility].  Dr. Meridee Score

## 2021-03-10 ENCOUNTER — Encounter: Payer: Self-pay | Admitting: Gastroenterology

## 2021-03-10 NOTE — Progress Notes (Signed)
GASTROENTEROLOGY OUTPATIENT CLINIC VISIT   Primary Care Provider Loyola Mastudd, Stephen M, MD [address] [place] [address] [phone]  Patient Profile: Becky Turner is a 76 y.o. female with a pmh significant for hypertension, hyperlipidemia, status post cholecystectomy (cholelithiasis requiring ERCP and stenting due to extensive stone burden and now all stones removed), H. pylori gastritis (eradication negative on for stool antigen but repeat gastric biopsies showed recurrence/persistence of infection and now eradicated on stool antigen).  The patient presents to the [facility] for an evaluation and management of problem(s) noted below:  Problem List 1. Normocytic anemia   2. Colon cancer screening   3. History of Helicobacter infection      History of Present Illness Please see prior notes for full details of HPI.  Interval History Overall, the patient has been doing well.  She did have repeat H. pylori stool antigen testing which showed that are second round of H. pylori treatment was helpful.  The patient is not experiencing any significant abdominal pain or discomfort.  We had previously discussed at our last visit that we would pursue potential colonoscopy for screening purposes later this year but we will going to discuss that further at this follow-up.  Since that \\clinic  visit, she also had blood work that showed that she has normocytic anemia without iron deficiency.  Patient is accompanied by her daughter who translates for her today.  The patient is not interested in any further endoscopic evaluation as she feels she is at risk of complications.  The patient's daughter and family want her to pursue endoscopies and colonoscopies as needed but the patient is fervent that she does not want to have that done.  GI Review of Systems Positive as above Negative for dysphagia, odynophagia, nausea, vomiting, pain, alteration of bowel habits, melena,  hematochezia  Review of Systems General: Denies fevers/chills/unintentional weight loss Cardiovascular: Denies chest pain Pulmonary: Denies shortness of breath Gastroenterological: See HPI Genitourinary: Denies darkened urine Hematological: Denies easy bruising/bleeding Dermatological: Denies jaundice Psychological: Mood is stable   Medications Current Outpatient Medications  Medication Sig Dispense Refill   Calcium-Magnesium-Vitamin D (CALCIUM 1200+D3 PO) Take 1 tablet by mouth daily.     lisinopril-hydrochlorothiazide (ZESTORETIC) 20-25 MG tablet Take 1 tablet by mouth daily.     prednisoLONE acetate (PRED FORTE) 1 % ophthalmic suspension Place 1 drop into the right eye daily.     rosuvastatin (CRESTOR) 20 MG tablet Take 1 tablet (20 mg total) by mouth daily. 90 tablet 3   No current facility-administered medications for this visit.    Allergies Allergies  Allergen Reactions   Aspirin Nausea And Vomiting   Amoxicillin Rash   Clarithromycin Rash    Histories Past Medical History:  Diagnosis Date   Anemia    Hyperlipidemia, familial, high LDL 01/30/2019   Hypertension    Prediabetes    Past Surgical History:  Procedure Laterality Date   BILIARY DILATION  10/09/2020   Procedure: BILIARY DILATION;  Surgeon: Meridee ScoreMansouraty, Netty StarringGabriel Jr., MD;  [facility];  Service: Gastroenterology;;   BILIARY STENT PLACEMENT  07/20/2020   Procedure: BILIARY STENT PLACEMENT;  Surgeon: Meryl DareStark, Malcolm T, MD;  [facility];  Service: Endoscopy;;   BILIARY STENT PLACEMENT  07/23/2020   Procedure: BILIARY STENT PLACEMENT;  Surgeon: Lemar LoftyMansouraty, Colbi Schiltz Jr., MD;  [facility];  Service: Gastroenterology;;   BIOPSY  07/23/2020   Procedure: BIOPSY;  Surgeon: Lemar LoftyMansouraty, Sevan Mcbroom Jr., MD;  [facility];  Service: Gastroenterology;;   BIOPSY  10/09/2020   Procedure: BIOPSY;  Surgeon: Lemar Lofty., MD;  [facility];  Service:  Gastroenterology;;   CATARACT EXTRACTION, BILATERAL  2019   CHOLECYSTECTOMY N/A 07/24/2020   Procedure: LAPAROSCOPIC CHOLECYSTECTOMY;  Surgeon: Violeta Gelinas, MD;  Location: [facility] OR;  Service: General;  Laterality: N/A;   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   ENDOSCOPIC RETROGRADE CHOLANGIOPANCREATOGRAPHY (ERCP) WITH PROPOFOL N/A 07/23/2020   Procedure: ENDOSCOPIC RETROGRADE CHOLANGIOPANCREATOGRAPHY (ERCP) WITH PROPOFOL;  Surgeon: Lemar Lofty., MD;  Location: [facility] ENDOSCOPY;  Service: Gastroenterology;  Laterality: N/A;   ENDOSCOPIC RETROGRADE CHOLANGIOPANCREATOGRAPHY (ERCP) WITH PROPOFOL N/A 10/09/2020   Procedure: ENDOSCOPIC RETROGRADE CHOLANGIOPANCREATOGRAPHY (ERCP) WITH PROPOFOL;  Surgeon: Meridee Score Netty Starring., MD;  [facility]: [facility];  Service: Gastroenterology;  Laterality: N/A;   ERCP N/A 07/20/2020   Procedure: ENDOSCOPIC RETROGRADE CHOLANGIOPANCREATOGRAPHY (ERCP);  Surgeon: Meryl Dare, MD;  Location: [facility] ENDOSCOPY;  Service: Endoscopy;  Laterality: N/A;   HEMOSTASIS CONTROL  07/20/2020   Procedure: HEMOSTASIS CONTROL;  Surgeon: Meryl Dare, MD;  Location: [facility] ENDOSCOPY;  Service: Endoscopy;;   REMOVAL OF STONES  07/20/2020   Procedure: REMOVAL OF STONES;  Surgeon: Meryl Dare, MD;  Location: [facility] ENDOSCOPY;  Service: Endoscopy;;   REMOVAL OF STONES  07/23/2020   Procedure: REMOVAL OF STONES;  Surgeon: Lemar Lofty., MD;  Location: [facility] ENDOSCOPY;  Service: Gastroenterology;;   REMOVAL OF STONES  10/09/2020   Procedure: REMOVAL OF STONES;  Surgeon: Lemar Lofty., MD;  [facility];  Service: Gastroenterology;;   [name]  07/20/2020   Procedure: [name];  Surgeon: Meryl Dare, MD;  Location: [facility] ENDOSCOPY;  Service: Endoscopy;;   SPYGLASS CHOLANGIOSCOPY N/A 07/23/2020   Procedure: [id number] CHOLANGIOSCOPY;  Surgeon: Lemar Lofty., MD;  Location: [facility] ENDOSCOPY;  Service: Gastroenterology;   Laterality: N/A;   SPYGLASS CHOLANGIOSCOPY N/A 10/09/2020   Procedure: SPYGLASS CHOLANGIOSCOPY;  Surgeon: Lemar Lofty., MD;  [facility];  Service: Gastroenterology;  Laterality: N/A;   SPYGLASS LITHOTRIPSY N/A 07/23/2020   Procedure: [id number] LITHOTRIPSY;  Surgeon: Lemar Lofty., MD;  Location: [facility] ENDOSCOPY;  Service: Gastroenterology;  Laterality: N/A;   STENT REMOVAL  07/23/2020   Procedure: STENT REMOVAL;  Surgeon: Lemar Lofty., MD;  Location: [facility] ENDOSCOPY;  Service: Gastroenterology;;   [name] REMOVAL  10/09/2020   Procedure: STENT REMOVAL;  Surgeon: Lemar Lofty., MD;  [facility];  Service: Gastroenterology;;   Social History   Socioeconomic History   Marital status: Married    Spouse name: Not on file   Number of children: 3   Years of education: Not on file   Highest education level: Not on file  Occupational History   Occupation: retired  Tobacco Use   Smoking status: Never   Smokeless tobacco: Never  Vaping Use   Vaping Use: Never used  Substance and Sexual Activity   Alcohol use: No   Drug use: No   Sexual activity: Not Currently  Other Topics Concern   Not on file  Social History Narrative   Born in [place]. [place] to [place] about 1991.   Social Determinants of Health   Financial Resource Strain: Not on file  Food Insecurity: Not on file  Transportation Needs: Not on file  Physical Activity: Not on file  Stress: Not on file  Social Connections: Not on file  Intimate Partner Violence: Not on file   Family History  Problem Relation Age of Onset   Hypertension Mother    Hypertension Father  Diabetes Sister    Gallstones Other    Hyperlipidemia Daughter    Other Neg Hx    Colon cancer Neg Hx    Esophageal cancer Neg Hx    Inflammatory bowel disease Neg Hx    Liver disease Neg Hx    Pancreatic cancer Neg Hx    Rectal cancer Neg Hx    Stomach cancer Neg Hx    I have reviewed her medical,  social, and family history in detail and updated the electronic medical record as necessary.    PHYSICAL EXAMINATION  BP (!) 124/58    Pulse 64    Ht 5' 0.5" (1.537 m)    Wt 109 lb 9.6 oz (49.7 kg)    BMI 21.05 kg/m  Wt Readings from Last 3 Encounters:  03/07/21 109 lb 9.6 oz (49.7 kg)  02/26/21 109 lb 9.6 oz (49.7 kg)  01/17/21 106 lb 6.4 oz (48.3 kg)  GEN: NAD, appears stated age, doesn't appear chronically ill, accompanied by daughter PSYCH: Cooperative, without pressured speech EYE: Conjunctivae pink, sclerae anicteric ENT: MMM CV: Nontachycardic RESP: No audible wheezing GI: NABS, soft, laparoscopic surgical scars present, nontender, without rebound or guarding  MSK/EXT: No lower extremity edema SKIN: No jaundice NEURO:  Alert & Oriented x 3, no focal deficits   REVIEW OF DATA  I reviewed the following data at the time of this encounter:  GI Procedures and Studies  Previously reviewed  Laboratory Studies  Reviewed those in epic  Imaging Studies  No new studies to review   ASSESSMENT  Becky Turner is a 76 y.o. female with a pmh significant for hypertension, hyperlipidemia, status post cholecystectomy (cholelithiasis requiring ERCP and stenting due to extensive stone burden and now all stones removed), H. pylori gastritis (eradication negative on for stool antigen but repeat gastric biopsies showed recurrence/persistence of infection and now eradicated on stool antigen).  The patient is seen today for evaluation and management of:  1. Normocytic anemia   2. Colon cancer screening   3. History of Helicobacter infection    The patient is clinically and hemodynamically stable.  Patient has a chronic normocytic anemia.  She has no evidence of iron deficiency based on her laboratories.  She was previously offered colonoscopy for screening purposes and had agreed to it but she no longer wants to undergo any further endoscopic evaluation.  Her family wants her to have this done but  she is adamant that she does not want that done.  I spoke with the patient at great length about the different modalities of colon cancer screening including FIT based testing and Cologuard based testing.  She does not want to pursue that, because she would not want to pursue a colonoscopy.  She is an average risk individual otherwise and could meet criteria for this.  At any time point if she decides that she would want to have 1 of these modalities performed or colonoscopy I am happy to be available.  In the setting of her normocytic anemia without iron deficiency, I am going to refer her to hematology to help discern if there are any other etiologies for her anemia although she has some mild chronic renal insufficiency, to make sure nothing else is being missed.  I will be available as needed in the future and happy to pursue endoscopic evaluations if it is deemed necessary by our hematology colleagues and if our patient is amenable to it.  All patient questions were answered to the best of my  ability, and the patient agrees to the aforementioned plan of action with follow-up as indicated.   PLAN  After extensive discussion with the patient and daughter, understanding the risks and benefits of colon cancer screening, she declines this (colonoscopy/FIT/Cologuard) Referral to hematology for further discussion of normocytic anemia evaluation I am happy to be available for endoscopic evaluation in future if felt indicated by our hematology colleagues and if patient amenable As needed follow-up otherwise   Orders Placed This Encounter  Procedures   CBC   Ambulatory referral to Hematology / Oncology    New Prescriptions   No medications on file   Modified Medications   No medications on file    Planned Follow Up No follow-ups on file.   Total Time in Face-to-Face and in Coordination of Care for patient including independent/personal interpretation/review of prior testing, medical history,  examination, medication adjustment, communicating results with the patient directly, and documentation within the EHR is 25 minutes.   Corliss ParishGabriel Mansouraty, MD [facility] Office # [phone]

## 2021-03-11 ENCOUNTER — Telehealth: Payer: Self-pay | Admitting: Physician Assistant

## 2021-03-11 NOTE — Telephone Encounter (Signed)
Scheduled appt per 1/27 referral. Spoke to pt's daughter who is aware of appt date and time.

## 2021-04-04 ENCOUNTER — Other Ambulatory Visit: Payer: Self-pay | Admitting: Family Medicine

## 2021-04-04 ENCOUNTER — Inpatient Hospital Stay: Payer: Medicare Other

## 2021-04-04 ENCOUNTER — Other Ambulatory Visit: Payer: Self-pay

## 2021-04-04 ENCOUNTER — Encounter: Payer: Self-pay | Admitting: Physician Assistant

## 2021-04-04 ENCOUNTER — Inpatient Hospital Stay: Payer: Medicare Other | Attending: Physician Assistant | Admitting: Physician Assistant

## 2021-04-04 ENCOUNTER — Encounter: Payer: Self-pay | Admitting: Family Medicine

## 2021-04-04 VITALS — BP 140/47 | HR 65 | Temp 98.1°F | Resp 18 | Wt 112.2 lb

## 2021-04-04 DIAGNOSIS — I10 Essential (primary) hypertension: Secondary | ICD-10-CM

## 2021-04-04 DIAGNOSIS — D649 Anemia, unspecified: Secondary | ICD-10-CM | POA: Insufficient documentation

## 2021-04-04 DIAGNOSIS — E785 Hyperlipidemia, unspecified: Secondary | ICD-10-CM | POA: Diagnosis not present

## 2021-04-04 DIAGNOSIS — N183 Chronic kidney disease, stage 3 unspecified: Secondary | ICD-10-CM | POA: Diagnosis not present

## 2021-04-04 DIAGNOSIS — R6889 Other general symptoms and signs: Secondary | ICD-10-CM

## 2021-04-04 DIAGNOSIS — Z79899 Other long term (current) drug therapy: Secondary | ICD-10-CM | POA: Diagnosis not present

## 2021-04-04 LAB — VITAMIN B12: Vitamin B-12: 307 pg/mL (ref 180–914)

## 2021-04-04 LAB — CBC WITH DIFFERENTIAL (CANCER CENTER ONLY)
Abs Immature Granulocytes: 0 10*3/uL (ref 0.00–0.07)
Basophils Absolute: 0.1 10*3/uL (ref 0.0–0.1)
Basophils Relative: 1 %
Eosinophils Absolute: 0.2 10*3/uL (ref 0.0–0.5)
Eosinophils Relative: 3 %
HCT: 33.8 % — ABNORMAL LOW (ref 36.0–46.0)
Hemoglobin: 10.8 g/dL — ABNORMAL LOW (ref 12.0–15.0)
Immature Granulocytes: 0 %
Lymphocytes Relative: 44 %
Lymphs Abs: 2.4 10*3/uL (ref 0.7–4.0)
MCH: 29 pg (ref 26.0–34.0)
MCHC: 32 g/dL (ref 30.0–36.0)
MCV: 90.6 fL (ref 80.0–100.0)
Monocytes Absolute: 0.4 10*3/uL (ref 0.1–1.0)
Monocytes Relative: 7 %
Neutro Abs: 2.4 10*3/uL (ref 1.7–7.7)
Neutrophils Relative %: 45 %
Platelet Count: 178 10*3/uL (ref 150–400)
RBC: 3.73 MIL/uL — ABNORMAL LOW (ref 3.87–5.11)
RDW: 11.9 % (ref 11.5–15.5)
WBC Count: 5.4 10*3/uL (ref 4.0–10.5)
nRBC: 0 % (ref 0.0–0.2)

## 2021-04-04 LAB — CMP (CANCER CENTER ONLY)
ALT: 16 U/L (ref 0–44)
AST: 21 U/L (ref 15–41)
Albumin: 4.6 g/dL (ref 3.5–5.0)
Alkaline Phosphatase: 36 U/L — ABNORMAL LOW (ref 38–126)
Anion gap: 7 (ref 5–15)
BUN: 44 mg/dL — ABNORMAL HIGH (ref 8–23)
CO2: 30 mmol/L (ref 22–32)
Calcium: 9.9 mg/dL (ref 8.9–10.3)
Chloride: 103 mmol/L (ref 98–111)
Creatinine: 1.33 mg/dL — ABNORMAL HIGH (ref 0.44–1.00)
GFR, Estimated: 41 mL/min — ABNORMAL LOW (ref >60)
Glucose, Bld: 98 mg/dL (ref 70–99)
Potassium: 4 mmol/L (ref 3.5–5.1)
Sodium: 140 mmol/L (ref 135–145)
Total Bilirubin: 0.4 mg/dL (ref 0.3–1.2)
Total Protein: 8.1 g/dL (ref 6.5–8.1)

## 2021-04-04 LAB — RETIC PANEL
Immature Retic Fract: 14.1 % (ref 2.3–15.9)
RBC.: 3.69 MIL/uL — ABNORMAL LOW (ref 3.87–5.11)
Retic Count, Absolute: 49.1 10*3/uL (ref 19.0–186.0)
Retic Ct Pct: 1.3 % (ref 0.4–3.1)
Reticulocyte Hemoglobin: 32.8 pg (ref >27.9)

## 2021-04-04 LAB — FOLATE: Folate: 19.3 ng/mL (ref >5.9)

## 2021-04-04 LAB — SAVE SMEAR(SSMR), FOR PROVIDER SLIDE REVIEW

## 2021-04-07 LAB — MULTIPLE MYELOMA PANEL, SERUM
Albumin SerPl Elph-Mcnc: 3.9 g/dL (ref 2.9–4.4)
Albumin/Glob SerPl: 1.2 (ref 0.7–1.7)
Alpha 1: 0.3 g/dL (ref 0.0–0.4)
Alpha2 Glob SerPl Elph-Mcnc: 0.7 g/dL (ref 0.4–1.0)
B-Globulin SerPl Elph-Mcnc: 1.2 g/dL (ref 0.7–1.3)
Gamma Glob SerPl Elph-Mcnc: 1.4 g/dL (ref 0.4–1.8)
Globulin, Total: 3.4 g/dL (ref 2.2–3.9)
IgA: 313 mg/dL (ref 64–422)
IgG (Immunoglobin G), Serum: 1303 mg/dL (ref 586–1602)
IgM (Immunoglobulin M), Srm: 60 mg/dL (ref 26–217)
Total Protein ELP: 7.3 g/dL (ref 6.0–8.5)

## 2021-04-07 NOTE — Progress Notes (Signed)
Becky Turner [phone]   Fax:([phone] [place] NOTE  Patient Care Team: Haydee Salter, MD as PCP - General (Family Medicine) Mansouraty, Telford Nab., MD as Consulting Physician (Gastroenterology) Adrian Prows, MD as Consulting Physician (Cardiology) Marilynne Halsted, MD as Referring Physician (Ophthalmology)  CHIEF COMPLAINTS/PURPOSE OF CONSULTATION:  Normocytic anemia  HISTORY OF PRESENTING ILLNESS:  Becky Turner 76 y.o. female with medical history significant for hyperlipidemia, hypertension and prediabetes.  Patient is accompanied by her daughter who is assisting with translation.  On exam today, Becky Turner reports that her energy levels are stable.  She has no fatigue and is able to complete all her daily activities independently.  She has a good appetite and denies any recent weight loss.  Patient denies any nausea, vomiting or abdominal pain.  Her bowel habits are regular without any diarrhea or constipation.  Patient denies easy bruising or signs of active bleeding.  She denies any headaches, dizziness or syncopal episodes.  Patient denies fevers, chills, night sweats, shortness of breath, chest pain or cough.  She has no other complaints.  Rest of the 10 point ROS is below.  MEDICAL HISTORY:  Past Medical History:  Diagnosis Date   Anemia    Hyperlipidemia, familial, high LDL 01/30/2019   Hypertension    Prediabetes     SURGICAL HISTORY: Past Surgical History:  Procedure Laterality Date   BILIARY DILATION  10/09/2020   Procedure: BILIARY DILATION;  Surgeon: Rush Landmark Telford Nab., MD;  Location: [facility];  Service: Gastroenterology;;   BILIARY STENT PLACEMENT  07/20/2020   Procedure: BILIARY STENT PLACEMENT;  Surgeon: Ladene Artist, MD;  Location: [place];  Service: Endoscopy;;   BILIARY STENT PLACEMENT  07/23/2020   Procedure: BILIARY STENT PLACEMENT;  Surgeon: Irving Copas., MD;  Location: [place];   Service: Gastroenterology;;   BIOPSY  07/23/2020   Procedure: BIOPSY;  Surgeon: Irving Copas., MD;  [place]  Service: Gastroenterology;;   BIOPSY  10/09/2020   Procedure: BIOPSY;  Surgeon: Irving Copas., MD;  Location: [facility];  Service: Gastroenterology;;   CATARACT EXTRACTION, BILATERAL  2019   CHOLECYSTECTOMY N/A 07/24/2020   Procedure: LAPAROSCOPIC CHOLECYSTECTOMY;  Surgeon: Georganna Skeans, MD;  Location: [place];  Service: General;  Laterality: N/A;   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   ENDOSCOPIC RETROGRADE CHOLANGIOPANCREATOGRAPHY (ERCP) WITH PROPOFOL N/A 07/23/2020   Procedure: ENDOSCOPIC RETROGRADE CHOLANGIOPANCREATOGRAPHY (ERCP) WITH PROPOFOL;  Surgeon: Irving Copas., MD;  Location: [place];  Service: Gastroenterology;  Laterality: N/A;   ENDOSCOPIC RETROGRADE CHOLANGIOPANCREATOGRAPHY (ERCP) WITH PROPOFOL N/A 10/09/2020   Procedure: ENDOSCOPIC RETROGRADE CHOLANGIOPANCREATOGRAPHY (ERCP) WITH PROPOFOL;  Surgeon: Rush Landmark Telford Nab., MD;  [facility];  Service: Gastroenterology;  Laterality: N/A;   ERCP N/A 07/20/2020   Procedure: ENDOSCOPIC RETROGRADE CHOLANGIOPANCREATOGRAPHY (ERCP);  Surgeon: Ladene Artist, MD;  Location: [facility];  Service: Endoscopy;  Laterality: N/A;   HEMOSTASIS CONTROL  07/20/2020   Procedure: HEMOSTASIS CONTROL;  Surgeon: Ladene Artist, MD;  Location: [place];  Service: Endoscopy;;   REMOVAL OF STONES  07/20/2020   Procedure: REMOVAL OF STONES;  Surgeon: Ladene Artist, MD;  Location: [place];  Service: Endoscopy;;   REMOVAL OF STONES  07/23/2020   Procedure: REMOVAL OF STONES;  Surgeon: Irving Copas., MD;  Location: [place];  Service: Gastroenterology;;   REMOVAL OF STONES  10/09/2020   Procedure: REMOVAL OF STONES;  Surgeon: Irving Copas., MD;  [facility];  Service:  Gastroenterology;;   Joan Mayans  07/20/2020    Procedure: SPHINCTEROTOMY;  Surgeon: Ladene Artist, MD;  Location: [facility];  Service: Endoscopy;;   SPYGLASS CHOLANGIOSCOPY N/A 07/23/2020   Procedure: [id number] CHOLANGIOSCOPY;  Surgeon: Irving Copas., MD;  Location: [place];  Service: Gastroenterology;  Laterality: N/A;   SPYGLASS CHOLANGIOSCOPY N/A 10/09/2020   Procedure: SPYGLASS CHOLANGIOSCOPY;  Surgeon: Irving Copas., MD;  [facility];  Service: Gastroenterology;  Laterality: N/A;   SPYGLASS LITHOTRIPSY N/A 07/23/2020   Procedure: [id number] LITHOTRIPSY;  Surgeon: Irving Copas., MD;  Location: [place];  Service: Gastroenterology;  Laterality: N/A;   STENT REMOVAL  07/23/2020   Procedure: STENT REMOVAL;  Surgeon: Irving Copas., MD;  Location: [place];  Service: Gastroenterology;;   [name] REMOVAL  10/09/2020   Procedure: STENT REMOVAL;  Surgeon: Irving Copas., MD;  Location: [facility];  Service: Gastroenterology;;    SOCIAL HISTORY: Social History   Socioeconomic History   Marital status: Married    Spouse name: Not on file   Number of children: 3   Years of education: Not on file   Highest education level: Not on file  Occupational History   Occupation: retired  Tobacco Use   Smoking status: Never   Smokeless tobacco: Never  Vaping Use   Vaping Use: Never used  Substance and Sexual Activity   Alcohol use: No   Drug use: No   Sexual activity: Not Currently  Other Topics Concern   Not on file  Social History Narrative   Born in [place]. Moved to [place] about 1991.   Social Determinants of Health   Financial Resource Strain: Not on file  Food Insecurity: Not on file  Transportation Needs: Not on file  Physical Activity: Not on file  Stress: Not on file  Social Connections: Not on file  Intimate Partner Violence: Not on file    FAMILY HISTORY: Family History  Problem Relation Age of Onset   Hypertension Mother    Hypertension Father     Diabetes Sister    Gallstones Other    Hyperlipidemia Daughter    Other Neg Hx    Colon cancer Neg Hx    Esophageal cancer Neg Hx    Inflammatory bowel disease Neg Hx    Liver disease Neg Hx    Pancreatic cancer Neg Hx    Rectal cancer Neg Hx    Stomach cancer Neg Hx     ALLERGIES:  is allergic to aspirin, amoxicillin, and clarithromycin.  MEDICATIONS:  Current Outpatient Medications  Medication Sig Dispense Refill   Calcium-Magnesium-Vitamin D (CALCIUM 1200+D3 PO) Take 1 tablet by mouth daily.     lisinopril-hydrochlorothiazide (ZESTORETIC) 20-25 MG tablet TAKE 1 TABLET BY MOUTH DAILY 90 tablet 0   prednisoLONE acetate (PRED FORTE) 1 % ophthalmic suspension Place 1 drop into the right eye daily.     rosuvastatin (CRESTOR) 20 MG tablet Take 1 tablet (20 mg total) by mouth daily. 90 tablet 3   No current facility-administered medications for this visit.    REVIEW OF SYSTEMS:   Constitutional: ( - ) fevers, ( - )  chills , ( - ) night sweats Eyes: ( - ) blurriness of vision, ( - ) double vision, ( - ) watery eyes Ears, nose, mouth, throat, and face: ( - ) mucositis, ( - ) sore throat Respiratory: ( - ) cough, ( - ) dyspnea, ( - ) wheezes Cardiovascular: ( - ) palpitation, ( - ) chest discomfort, ( - )  lower extremity swelling Gastrointestinal:  ( - ) nausea, ( - ) heartburn, ( - ) change in bowel habits Skin: ( - ) abnormal skin rashes Lymphatics: ( - ) new lymphadenopathy, ( - ) easy bruising Neurological: ( - ) numbness, ( - ) tingling, ( - ) new weaknesses Behavioral/Psych: ( - ) mood change, ( - ) new changes  All other systems were reviewed with the patient and are negative.  PHYSICAL EXAMINATION: ECOG PERFORMANCE STATUS: 0 - Asymptomatic  Vitals:   04/04/21 1407  BP: (!) 140/47  Pulse: 65  Resp: 18  Temp: 98.1 F (36.7 C)  SpO2: 99%   Filed Weights   04/04/21 1407  Weight: 112 lb 3.2 oz (50.9 kg)    GENERAL: well appearing female in NAD  SKIN: skin  color, texture, turgor are normal, no rashes or significant lesions EYES: conjunctiva are pink and non-injected, sclera clear OROPHARYNX: no exudate, no erythema; lips, buccal mucosa, and tongue normal  NECK: supple, non-tender LYMPH:  no palpable lymphadenopathy in the cervical, axillary or supraclavicular lymph nodes.  LUNGS: clear to auscultation and percussion with normal breathing effort HEART: regular rate & rhythm and no murmurs and no lower extremity edema ABDOMEN: soft, non-tender, non-distended, normal bowel sounds Musculoskeletal: no cyanosis of digits and no clubbing  PSYCH: alert & oriented x 3, fluent speech NEURO: no focal motor/sensory deficits  LABORATORY DATA:  I have reviewed the data as listed CBC Latest Ref Rng & Units 04/04/2021 02/26/2021 01/17/2021  WBC 4.0 - 10.5 K/uL 5.4 5.6 6.6  Hemoglobin 12.0 - 15.0 g/dL 10.8(L) 10.6(L) 9.7(L)  Hematocrit 36.0 - 46.0 % 33.8(L) 32.7(L) 30.1(L)  Platelets 150 - 400 K/uL 178 185.0 208.0    CMP Latest Ref Rng & Units 04/04/2021 11/15/2020 09/09/2020  Glucose 70 - 99 mg/dL 98 120(H) 99  BUN 8 - 23 mg/dL 44(H) 28(H) 18  Creatinine 0.44 - 1.00 mg/dL 1.33(H) 1.42(H) 1.01  Sodium 135 - 145 mmol/L 140 141 140  Potassium 3.5 - 5.1 mmol/L 4.0 3.8 3.8  Chloride 98 - 111 mmol/L 103 100 105  CO2 22 - 32 mmol/L 30 32 26  Calcium 8.9 - 10.3 mg/dL 9.9 9.2 9.2  Total Protein 6.5 - 8.1 g/dL 8.1 7.6 7.4  Total Bilirubin 0.3 - 1.2 mg/dL 0.4 0.4 0.7  Alkaline Phos 38 - 126 U/L 36(L) 42 39  AST 15 - 41 U/L '21 20 22  ' ALT 0 - 44 U/L '16 24 21    ' RADIOGRAPHIC STUDIES: I have personally reviewed the radiological images as listed and agreed with the findings in the report. No results found.  ASSESSMENT & PLAN Becky Turner is a 76 y.o. female who presents to the clinic for initial evaluation for normocytic anemia.  We reviewed the differentials including iron deficiencies, nutritional deficiencies, hemolysis, chronic kidney disease, paraproteinemias and  bone marrow disorders.  I reviewed most recent labs from 02/26/2021 that showed hemoglobin 10.6 g/dL and MCV 89.5 fl. iron panel from 01/22/2021 shows no evidence of iron deficiency.  Patient will proceed with initial serologic work-up to check CBC, CMP, vitamin B12 level, methylmalonic acid levels, folate level, reticulocyte panel, SPEP with IFE, safe smear.  #Normocytic anemia: --Etiology unknown but anemia started around June 2022.  --Patient has Stage 3 chronic kidney disease which is a contributing risk factor.  --Labs from 01/22/2021 shows no evidence of iron deficiency --Labs today to check  CBC, CMP, vitamin B12 level, methylmalonic acid levels, folate level, reticulocyte panel, SPEP with  IFE, save smear. --RTC based on above workup  Orders Placed This Encounter  Procedures   CBC with Differential ([place] Only)    Standing Status:   Future    Number of Occurrences:   1    Standing Expiration Date:   04/04/2022   CMP ([place] only)    Standing Status:   Future    Number of Occurrences:   1    Standing Expiration Date:   04/04/2022   Vitamin B12    Standing Status:   Future    Number of Occurrences:   1    Standing Expiration Date:   04/04/2022   Methylmalonic acid, serum    Standing Status:   Future    Number of Occurrences:   1    Standing Expiration Date:   04/04/2022   Folate, Serum    Standing Status:   Future    Number of Occurrences:   1    Standing Expiration Date:   04/04/2022   Retic Panel    Standing Status:   Future    Number of Occurrences:   1    Standing Expiration Date:   04/04/2022   Multiple Myeloma Panel (SPEP&IFE w/QIG)    Standing Status:   Future    Number of Occurrences:   1    Standing Expiration Date:   04/04/2022   Save Smear [facility])    Standing Status:   Future    Number of Occurrences:   1    Standing Expiration Date:   04/04/2022    All questions were answered. The patient knows to call the clinic with any problems, questions or  concerns.  I have spent a total of 60 minutes minutes of face-to-face and non-face-to-face time, preparing to see the patient, obtaining and/or reviewing separately obtained history, performing a medically appropriate examination, counseling and educating the patient, ordering tests, documenting clinical information in the electronic health record, and care coordination.   Dede Query, PA-C Department of Hematology/Oncology [place] at [facility] Phone: [phone]  Patient was seen with Dr. Lorenso Courier  I have read the above note and personally examined the patient. I agree with the assessment and plan as noted above.  Briefly this Zale is a 76 year old [place] female who presents for evaluation of normocytic anemia.  At this time the etiology of her normocytic anemia is unclear.  We will do a broad work-up to include nutritional studies (with vitamin B12, folate), SPEP/serum free light chains, and routine CBC CMP.  It is possible this is anemia of chronic disease due to her kidney dysfunction.  The patient voiced understanding of this plan moving forward.   Ledell Peoples, MD Department of Hematology/Oncology [place] [facility] Phone: [phone] Pager: [phone] Email: [email or web address]

## 2021-04-17 LAB — METHYLMALONIC ACID, SERUM: Methylmalonic Acid, Quantitative: 262 nmol/L (ref 0–378)

## 2021-04-18 ENCOUNTER — Ambulatory Visit: Payer: Medicare Other | Admitting: Family Medicine

## 2021-04-18 ENCOUNTER — Telehealth: Payer: Self-pay | Admitting: Physician Assistant

## 2021-04-18 MED ORDER — VITAMIN B-12 1000 MCG PO TABS: 1000.0000 ug | ORAL_TABLET | Freq: Every day | ORAL | 3 refills | Status: DC

## 2021-04-21 ENCOUNTER — Telehealth: Payer: Self-pay | Admitting: Physician Assistant

## 2021-04-21 NOTE — Telephone Encounter (Signed)
Cancelled appts per 3/13 pt request, pt does not want follow up at this time, I told pt to call back if they wish to r/s ?

## 2021-04-21 NOTE — Telephone Encounter (Signed)
I spoke to patient's daughter, Ms. Terri Le, to review the lab results from 04/04/2021. Findings confirm persistent normocytic anemia with Hgb 10.8, MCV 90.6. There is evidence of some renal dysfunction and borderline vitamin B12 deficiency so recommend to start oral vitamin B12 supplementation. Ultimately, without any clear underlying cause, we recommend a bone marrow biopsy to evaluate for bone marrow disorders. Patient's daughter would like to delay a bone marrow biopsy at this time since patient is not having any symptoms of anemia.  ? ?We will see patient back in 3 months to repeat labs and closely monitor levels. I sent a prescription of vitamin B12 supplementation to the pharmacy on file. Patient's daughter is aware to reach out to the clinic if patient develops any new symptoms including fatigue or shortness of breath.  ?

## 2021-05-25 ENCOUNTER — Other Ambulatory Visit: Payer: Self-pay | Admitting: Family Medicine

## 2021-05-25 DIAGNOSIS — I10 Essential (primary) hypertension: Secondary | ICD-10-CM

## 2021-07-22 ENCOUNTER — Other Ambulatory Visit: Payer: Medicare Other

## 2021-07-22 ENCOUNTER — Ambulatory Visit: Payer: Medicare Other | Admitting: Physician Assistant

## 2021-09-07 ENCOUNTER — Other Ambulatory Visit: Payer: Self-pay | Admitting: Cardiology

## 2021-09-22 ENCOUNTER — Other Ambulatory Visit: Payer: Self-pay | Admitting: Family Medicine

## 2021-09-22 ENCOUNTER — Encounter: Payer: Self-pay | Admitting: Family Medicine

## 2021-09-22 DIAGNOSIS — I10 Essential (primary) hypertension: Secondary | ICD-10-CM

## 2021-09-22 MED ORDER — LISINOPRIL-HYDROCHLOROTHIAZIDE 20-25 MG PO TABS: 1.0000 | ORAL_TABLET | Freq: Every day | ORAL | 1 refills | Status: DC

## 2021-11-24 DIAGNOSIS — Z23 Encounter for immunization: Secondary | ICD-10-CM | POA: Diagnosis not present

## 2021-12-17 ENCOUNTER — Other Ambulatory Visit: Payer: Self-pay

## 2021-12-17 DIAGNOSIS — I10 Essential (primary) hypertension: Secondary | ICD-10-CM

## 2021-12-17 DIAGNOSIS — E7849 Other hyperlipidemia: Secondary | ICD-10-CM

## 2021-12-17 MED ORDER — LISINOPRIL-HYDROCHLOROTHIAZIDE 20-25 MG PO TABS: 1.0000 | ORAL_TABLET | Freq: Every day | ORAL | 1 refills | Status: DC

## 2021-12-17 MED ORDER — ROSUVASTATIN CALCIUM 20 MG PO TABS: 20.0000 mg | ORAL_TABLET | Freq: Every day | ORAL | 3 refills | Status: DC

## 2021-12-17 NOTE — Telephone Encounter (Signed)
Refill request for  Rosuvastatin 20 mg LR 01/17/21, #90, 2 rf  Lisinopril 20mg   LR 09/22/21, #90, 1 rf LOV 02/26/21 FOV  01/19/22  Please review and advise.  Thanks. Dm/cma

## 2022-01-19 ENCOUNTER — Ambulatory Visit (INDEPENDENT_AMBULATORY_CARE_PROVIDER_SITE_OTHER): Payer: Medicare Other | Admitting: Family Medicine

## 2022-01-19 ENCOUNTER — Encounter: Payer: Self-pay | Admitting: Family Medicine

## 2022-01-19 VITALS — BP 124/66 | HR 69 | Temp 98.0°F | Ht 60.5 in | Wt 128.0 lb

## 2022-01-19 DIAGNOSIS — E7849 Other hyperlipidemia: Secondary | ICD-10-CM

## 2022-01-19 DIAGNOSIS — N1831 Chronic kidney disease, stage 3a: Secondary | ICD-10-CM | POA: Diagnosis not present

## 2022-01-19 DIAGNOSIS — I10 Essential (primary) hypertension: Secondary | ICD-10-CM

## 2022-01-19 DIAGNOSIS — R7303 Prediabetes: Secondary | ICD-10-CM | POA: Diagnosis not present

## 2022-01-19 DIAGNOSIS — D649 Anemia, unspecified: Secondary | ICD-10-CM | POA: Diagnosis not present

## 2022-01-19 MED ORDER — LISINOPRIL-HYDROCHLOROTHIAZIDE 20-25 MG PO TABS: 1.0000 | ORAL_TABLET | Freq: Every day | ORAL | 3 refills | Status: DC

## 2022-01-19 MED ORDER — ROSUVASTATIN CALCIUM 20 MG PO TABS: 20.0000 mg | ORAL_TABLET | Freq: Every day | ORAL | 3 refills | Status: DC

## 2022-01-19 NOTE — Progress Notes (Signed)
[facility] [address] [place] [address] Dept: [phone] Dept Fax: [phone]  Chronic Care Office Visit  Subjective:    Patient ID: Becky Turner, female    DOB: 1945-10-13, 76 y.o..   MRN: [id number]  Chief Complaint  Patient presents with   Hypertension   Hyperlipidemia   Anemia    History of Present Illness:  Patient is in today for reassessment of chronic medical issues. Interpretation was provided by her daughter, Becky Turner. It is Becky Turner's preference to have her daughter fulfill this role.   Becky Turner has a history of hypertension. She is managed on Zestoretic (lisinopril/HCTZ) 20-25 mg daily.    Becky Turner has a history of hyperlipidemia. She is currently on rosuvastatin 20 mg daily.    Becky Turner has a history of prediabetes. he sister has developed diabetes, so she is worried about this.  Becky Turner has a history of normocytic anemia. She last Winter by hematology. She was found to have some mild renal dysfunction and a borderline low Vit.B12. She was offered a bone marrow biopsy, but declined at that point. She does notes some fatigue at times.  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   Elevated LFTs    Choledocholithiasis 07/19/2020   Chronic kidney disease, stage 3a ([place]) 07/19/2020   Hyperlipidemia, familial, high LDL 01/30/2019   Essential hypertension 09/10/2015   Dizziness and giddiness 09/10/2015   Insomnia 09/10/2015   Prediabetes 09/10/2015   Past Surgical History:  Procedure Laterality Date   BILIARY DILATION  10/09/2020   Procedure: BILIARY DILATION;  Surgeon: Irving Copas., MD;   Location: [facility];  Service: Gastroenterology;;   BILIARY STENT PLACEMENT  07/20/2020   Procedure: BILIARY STENT PLACEMENT;  Surgeon: Ladene Artist, MD;  Location: [place];  Service: Endoscopy;;   BILIARY STENT PLACEMENT  07/23/2020   Procedure: BILIARY STENT PLACEMENT;  Surgeon: Irving Copas., MD;  Location: [place];  Service: Gastroenterology;;   BIOPSY  07/23/2020   Procedure: BIOPSY;  Surgeon: Irving Copas., MD;  Location: [place];  Service: Gastroenterology;;   BIOPSY  10/09/2020   Procedure: BIOPSY;  Surgeon: Irving Copas., MD;  [facility];  Service: Gastroenterology;;   CATARACT EXTRACTION, BILATERAL  2019   CHOLECYSTECTOMY N/A 07/24/2020   Procedure: LAPAROSCOPIC CHOLECYSTECTOMY;  Surgeon: Georganna Skeans, MD;  Location: [place];  Service: General;  Laterality: N/A;   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   ENDOSCOPIC RETROGRADE CHOLANGIOPANCREATOGRAPHY (ERCP) WITH PROPOFOL N/A 07/23/2020   Procedure: ENDOSCOPIC RETROGRADE CHOLANGIOPANCREATOGRAPHY (ERCP) WITH PROPOFOL;  Surgeon: Irving Copas., MD;  Location: [place];  Service: Gastroenterology;  Laterality: N/A;   ENDOSCOPIC RETROGRADE CHOLANGIOPANCREATOGRAPHY (ERCP) WITH PROPOFOL N/A 10/09/2020   Procedure: ENDOSCOPIC RETROGRADE CHOLANGIOPANCREATOGRAPHY (ERCP) WITH PROPOFOL;  Surgeon: Rush Landmark Telford Nab., MD;  [facility];  Service: Gastroenterology;  Laterality: N/A;   ERCP N/A 07/20/2020   Procedure: ENDOSCOPIC RETROGRADE CHOLANGIOPANCREATOGRAPHY (ERCP);  Surgeon: Ladene Artist, MD;  Location[facility] END[facility]COPY;  Service: Endoscopy;  Laterality: N/A;   HEMOSTASIS CONTROL  07/20/2020   Procedure: HEMOSTASIS CONTROL;  Surgeon: Ladene Artist, MD;  Location: [facility] ENDOSCOPY;  Service: Endoscopy;;   REMOVAL OF  STONES  07/20/2020   Procedure: REMOVAL OF STONES;  Surgeon: Ladene Artist, MD;  Location: [place];  Service: Endoscopy;;    REMOVAL OF STONES  07/23/2020   Procedure: REMOVAL OF STONES;  Surgeon: Irving Copas., MD;  Location: [place];  Service: Gastroenterology;;   REMOVAL OF STONES  10/09/2020   Procedure: REMOVAL OF STONES;  Surgeon: Irving Copas., MD;  [facility];  Service: Gastroenterology;;   Becky Turner  07/20/2020   Procedure: Becky Turner;  Surgeon: Ladene Artist, MD;  Location: [facility]COPY;  Service: Endoscopy;;   SPYGLASS CHOLANGIOSCOPY N/A 07/23/2020   Procedure: [id number] CHOLANGIOSCOPY;  Surgeon: Irving Copas., MD;  Location: [place];  Service: Gastroenterology;  Laterality: N/A;   SPYGLASS CHOLANGIOSCOPY N/A 10/09/2020   Procedure: SPYGLASS CHOLANGIOSCOPY;  Surgeon: Irving Copas., MD;  [facility];  Service: Gastroenterology;  Laterality: N/A;   SPYGLASS LITHOTRIPSY N/A 07/23/2020   Procedure: [id number] LITHOTRIPSY;  Surgeon: Irving Copas., MD;  Location: [place];  Service: Gastroenterology;  Laterality: N/A;   STENT REMOVAL  07/23/2020   Procedure: STENT REMOVAL;  Surgeon: Rush Landmark Telford Nab., MD;  Location: [place];  Service: Gastroenterology;;   Becky Turner REMOVAL  10/09/2020   Procedure: STENT REMOVAL;  Surgeon: Irving Copas., MD;  [facility];  Service: Gastroenterology;;   Family History  Problem Relation Age of Onset   Hypertension Mother    Hypertension Father    Diabetes Sister    Gallstones Other    Hyperlipidemia Daughter    Other Neg Hx    Colon cancer Neg Hx    Esophageal cancer Neg Hx    Inflammatory bowel disease Neg Hx    Liver disease Neg Hx    Pancreatic cancer Neg Hx    Rectal cancer Neg Hx    Stomach cancer Neg Hx    Outpatient Medications Prior to Visit  Medication Sig Dispense Refill   prednisoLONE acetate (PRED FORTE) 1 % ophthalmic suspension Place 1 drop into the right eye daily.     vitamin B-12 (CYANOCOBALAMIN) 1000 MCG tablet Take 1 tablet  (1,000 mcg total) by mouth daily. 30 tablet 3   lisinopril-hydrochlorothiazide (ZESTORETIC) 20-25 MG tablet Take 1 tablet by mouth daily. 90 tablet 1   rosuvastatin (CRESTOR) 20 MG tablet Take 1 tablet (20 mg total) by mouth daily. 90 tablet 3   Calcium-Magnesium-Vitamin D (CALCIUM 1200+D3 PO) Take 1 tablet by mouth daily. (Patient not taking: Reported on 01/19/2022)     No facility-administered medications prior to visit.   Allergies  Allergen Reactions   Aspirin Nausea And Vomiting   Amoxicillin Rash   Clarithromycin Rash     Objective:   Today's Vitals   01/19/22 1017  BP: 124/66  Pulse: 69  Temp: 98 F (36.7 C)  TempSrc: Temporal  SpO2: 99%  Weight: 128 lb (58.1 kg)  Height: 5' 0.5" (1.537 m)   Body mass index is 24.59 kg/m.   General: Well developed, well nourished. No acute distress. HEENT: Normocephalic, non-traumatic. External ears normal. EAC and TMs normal bilaterally. PERRL,   EOMI. Conjunctiva clear. There is a blue ring to the cornea OU. There is some bluish discoloration over   the medial cornea OS. There is a small mole on the left upper eyelid. Nose clear without congestion or   rhinorrhea. Mucous membranes moist. Oropharynx clear. Good dentition. Neck: Supple. No lymphadenopathy. No thyromegaly. Lungs: Clear to auscultation bilaterally. No wheezing, rales or rhonchi. CV: RRR without murmurs or rubs.  Pulses 2+ bilaterally. Abdomen: Soft, non-tender. Bowel sounds positive, normal pitch and frequency. No hepatosplenomegaly.   No rebound or guarding. Extremities: Full ROM. No joint swelling or tenderness. Moderate crepitance of the left knee. No edema   noted. Skin: Warm and dry. No rashes. Psych: Alert and oriented. Normal mood and affect.  Health Maintenance Due  Topic Date Due   Medicare Annual Wellness (AWV)  Never done   DTaP/Tdap/Td (1 - Tdap) Never done   Zoster Vaccines- Shingrix (1 of 2) Never done     Assessment & Plan:   1. Essential  hypertension Blood pressure is at goal. Continue Zestoretic.  - lisinopril-hydrochlorothiazide (ZESTORETIC) 20-25 MG tablet; Take 1 tablet by mouth daily.  Dispense: 90 tablet; Refill: 3 - Comprehensive metabolic panel; Future  2. Chronic kidney disease, stage 3a ([place]) We will reassess renal function today. Continue BP management and avoidance of nephrotoxic drugs.  - Comprehensive metabolic panel; Future  3. Hyperlipidemia, familial, high LDL I will reassess lipids today. Continue rosuvastatin.  - rosuvastatin (CRESTOR) 20 MG tablet; Take 1 tablet (20 mg total) by mouth daily.  Dispense: 90 tablet; Refill: 3 - Lipid panel; Future  4. Prediabetes We will reassess glucose and A1c today.  - Hemoglobin A1c; Future - Comprehensive metabolic panel; Future  5. Normocytic anemia I will repeat her CBC today. Ifr she remains anemic, I will plan to have her follow-up with hematology.  - CBC; Future   Return in about 6 months (around 07/21/2022) for Reassessment.   Haydee Salter, MD

## 2022-01-20 ENCOUNTER — Other Ambulatory Visit (INDEPENDENT_AMBULATORY_CARE_PROVIDER_SITE_OTHER): Payer: Medicare Other

## 2022-01-20 ENCOUNTER — Encounter: Payer: Self-pay | Admitting: Physician Assistant

## 2022-01-20 ENCOUNTER — Encounter: Payer: Self-pay | Admitting: Family Medicine

## 2022-01-20 DIAGNOSIS — I10 Essential (primary) hypertension: Secondary | ICD-10-CM

## 2022-01-20 DIAGNOSIS — R7303 Prediabetes: Secondary | ICD-10-CM

## 2022-01-20 DIAGNOSIS — E7849 Other hyperlipidemia: Secondary | ICD-10-CM

## 2022-01-20 DIAGNOSIS — D649 Anemia, unspecified: Secondary | ICD-10-CM

## 2022-01-20 DIAGNOSIS — Z961 Presence of intraocular lens: Secondary | ICD-10-CM | POA: Insufficient documentation

## 2022-01-20 DIAGNOSIS — N1831 Chronic kidney disease, stage 3a: Secondary | ICD-10-CM

## 2022-01-20 LAB — COMPREHENSIVE METABOLIC PANEL
ALT: 11 U/L (ref 0–35)
AST: 18 U/L (ref 0–37)
Albumin: 4.4 g/dL (ref 3.5–5.2)
Alkaline Phosphatase: 37 U/L — ABNORMAL LOW (ref 39–117)
BUN: 32 mg/dL — ABNORMAL HIGH (ref 6–23)
CO2: 30 mEq/L (ref 19–32)
Calcium: 9.5 mg/dL (ref 8.4–10.5)
Chloride: 103 mEq/L (ref 96–112)
Creatinine, Ser: 1.39 mg/dL — ABNORMAL HIGH (ref 0.40–1.20)
GFR: 36.76 mL/min — ABNORMAL LOW (ref >60.00)
Glucose, Bld: 103 mg/dL — ABNORMAL HIGH (ref 70–99)
Potassium: 4.2 mEq/L (ref 3.5–5.1)
Sodium: 141 mEq/L (ref 135–145)
Total Bilirubin: 0.6 mg/dL (ref 0.2–1.2)
Total Protein: 7.4 g/dL (ref 6.0–8.3)

## 2022-01-20 LAB — HEMOGLOBIN A1C: Hgb A1c MFr Bld: 6.6 % — ABNORMAL HIGH (ref 4.6–6.5)

## 2022-01-20 LAB — CBC
HCT: 33.5 % — ABNORMAL LOW (ref 36.0–46.0)
Hemoglobin: 10.8 g/dL — ABNORMAL LOW (ref 12.0–15.0)
MCHC: 32.2 g/dL (ref 30.0–36.0)
MCV: 89.9 fl (ref 78.0–100.0)
Platelets: 194 10*3/uL (ref 150.0–400.0)
RBC: 3.73 Mil/uL — ABNORMAL LOW (ref 3.87–5.11)
RDW: 12.8 % (ref 11.5–15.5)
WBC: 6.2 10*3/uL (ref 4.0–10.5)

## 2022-01-20 LAB — LIPID PANEL
Cholesterol: 229 mg/dL — ABNORMAL HIGH (ref 0–200)
HDL: 86.2 mg/dL (ref >39.00)
LDL Cholesterol: 109 mg/dL — ABNORMAL HIGH (ref 0–99)
NonHDL: 143.22
Total CHOL/HDL Ratio: 3
Triglycerides: 172 mg/dL — ABNORMAL HIGH (ref 0.0–149.0)
VLDL: 34.4 mg/dL (ref 0.0–40.0)

## 2022-01-20 MED ORDER — ROSUVASTATIN CALCIUM 40 MG PO TABS: 40.0000 mg | ORAL_TABLET | Freq: Every day | ORAL | 3 refills | Status: DC

## 2022-01-20 NOTE — Addendum Note (Signed)
Addended by: Loyola Mast on: 01/20/2022 02:04 PM   Modules accepted: Orders

## 2022-01-21 ENCOUNTER — Telehealth: Payer: Self-pay | Admitting: Physician Assistant

## 2022-01-21 DIAGNOSIS — H43313 Vitreous membranes and strands, bilateral: Secondary | ICD-10-CM | POA: Diagnosis not present

## 2022-01-21 DIAGNOSIS — Z947 Corneal transplant status: Secondary | ICD-10-CM | POA: Diagnosis not present

## 2022-01-21 DIAGNOSIS — Z9889 Other specified postprocedural states: Secondary | ICD-10-CM | POA: Diagnosis not present

## 2022-01-21 DIAGNOSIS — H35362 Drusen (degenerative) of macula, left eye: Secondary | ICD-10-CM | POA: Diagnosis not present

## 2022-01-21 DIAGNOSIS — Z961 Presence of intraocular lens: Secondary | ICD-10-CM | POA: Diagnosis not present

## 2022-01-21 NOTE — Telephone Encounter (Signed)
Per 12/13 IB, message left with daughter

## 2022-02-04 ENCOUNTER — Other Ambulatory Visit: Payer: Self-pay | Admitting: Physician Assistant

## 2022-02-04 ENCOUNTER — Other Ambulatory Visit: Payer: Self-pay

## 2022-02-04 ENCOUNTER — Inpatient Hospital Stay: Payer: Medicare Other | Attending: Physician Assistant | Admitting: Physician Assistant

## 2022-02-04 VITALS — BP 141/54 | HR 83 | Temp 97.7°F | Resp 18 | Ht 60.5 in | Wt 123.9 lb

## 2022-02-04 DIAGNOSIS — N183 Chronic kidney disease, stage 3 unspecified: Secondary | ICD-10-CM | POA: Insufficient documentation

## 2022-02-04 DIAGNOSIS — D649 Anemia, unspecified: Secondary | ICD-10-CM

## 2022-02-04 NOTE — Progress Notes (Signed)
[facility] [phone]   Fax:([phone]) [phone]  PROGRESS NOTE  Patient Care Team: Loyola Mastudd, Stephen M, MD as PCP - General (Family Medicine) Mansouraty, Netty StarringGabriel Jr., MD as Consulting Physician (Gastroenterology) Yates DecampGanji, Jay, MD as Consulting Physician (Cardiology) Holli HumblesGiegengack, Matthew, MD as Referring Physician (Ophthalmology) Raymondo Bandhayil, Tameia Rafferty T, PA-C as Physician Assistant (Hematology and Oncology)  CHIEF COMPLAINTS/PURPOSE OF CONSULTATION:  Normocytic anemia  HISTORY OF PRESENTING ILLNESS:  Becky Turner 76 y.o. female returns for  a follow up for anemia. Patient is accompanied by her daughter who is assisting with translation.  On exam today, Ms. Brockway reports her energy levels are stable. She experiences mild fatigue but can complete her ADLs on her own. She denies any changes to her appetite or weight loss. She denies any nausea, vomiting or abdominal pain.  Her bowel habits are unchanged without any diarrhea or constipation.  Patient denies easy bruising or signs of active bleeding.  She denies any headaches, dizziness or syncopal episodes.  Patient denies fevers, chills, night sweats, shortness of breath, chest pain or cough.  She has no other complaints.  Rest of the 10 point ROS is below.  MEDICAL HISTORY:  Past Medical History:  Diagnosis Date   Anemia    Hyperlipidemia, familial, high LDL 01/30/2019   Hypertension    Prediabetes     SURGICAL HISTORY: Past Surgical History:  Procedure Laterality Date   BILIARY DILATION  10/09/2020   Procedure: BILIARY DILATION;  Surgeon: Meridee ScoreMansouraty, Netty StarringGabriel Jr., MD;  [facility];  Service: Gastroenterology;;   BILIARY STENT PLACEMENT  07/20/2020   Procedure: BILIARY STENT PLACEMENT;  Surgeon: Meryl DareStark, Malcolm T, MD;  [facility];  Service: Endoscopy;;   BILIARY STENT PLACEMENT  07/23/2020   Procedure: BILIARY STENT PLACEMENT;  Surgeon: Lemar LoftyMansouraty, Gabriel Jr., MD;  [facility];  Service:  Gastroenterology;;   BIOPSY  07/23/2020   Procedure: BIOPSY;  Surgeon: Lemar LoftyMansouraty, Gabriel Jr., MD;  [facility];  Service: Gastroenterology;;   BIOPSY  10/09/2020   Procedure: BIOPSY;  Surgeon: Lemar LoftyMansouraty, Gabriel Jr., MD;  [facility];  Service: Gastroenterology;;   CATARACT EXTRACTION, BILATERAL  2019   CHOLECYSTECTOMY N/A 07/24/2020   Procedure: LAPAROSCOPIC CHOLECYSTECTOMY;  Surgeon: Violeta Gelinashompson, Burke, MD;  [facility];  Service: General;  Laterality: N/A;   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   ENDOSCOPIC RETROGRADE CHOLANGIOPANCREATOGRAPHY (ERCP) WITH PROPOFOL N/A 07/23/2020   Procedure: ENDOSCOPIC RETROGRADE CHOLANGIOPANCREATOGRAPHY (ERCP) WITH PROPOFOL;  Surgeon: Lemar LoftyMansouraty, Gabriel Jr., MD;  [facility];  Service: Gastroenterology;  Laterality: N/A;   ENDOSCOPIC RETROGRADE CHOLANGIOPANCREATOGRAPHY (ERCP) WITH PROPOFOL N/A 10/09/2020   Procedure: ENDOSCOPIC RETROGRADE CHOLANGIOPANCREATOGRAPHY (ERCP) WITH PROPOFOL;  Surgeon: Meridee ScoreMansouraty, Netty StarringGabriel Jr., MD;  [facility];  Service: Gastroenterology;  Laterality: N/A;   ERCP N/A 07/20/2020   Procedure: ENDOSCOPIC RETROGRADE CHOLANGIOPANCREATOGRAPHY (ERCP);  Surgeon: Meryl DareStark, Malcolm T, MD;  [facility];  Service: Endoscopy;  Laterality: N/A;   HEMOSTASIS CONTROL  07/20/2020   Procedure: HEMOSTASIS CONTROL;  Surgeon: Meryl DareStark, Malcolm T, MD;  [facility];  Service: Endoscopy;;   REMOVAL OF STONES  07/20/2020   Procedure: REMOVAL OF STONES;  Surgeon: Meryl DareStark, Malcolm T, MD;  [facility];  Service: Endoscopy;;   REMOVAL OF STONES  07/23/2020   Procedure: REMOVAL OF STONES;  Surgeon: Lemar LoftyMansouraty, Gabriel Jr., MD;  [facility];  Service: Gastroenterology;;   REMOVAL OF STONES  10/09/2020   Procedure: REMOVAL OF STONES;  Surgeon: Lemar LoftyMansouraty, Gabriel Jr., MD;  [facility];  Service:  Gastroenterology;;   [name]  07/20/2020   Procedure:  SPHINCTEROTOMY;  Surgeon: Meryl Dare, MD;  [facility];  Service: Endoscopy;;   SPYGLASS CHOLANGIOSCOPY N/A 07/23/2020   Procedure: ZOXWRUEA CHOLANGIOSCOPY;  Surgeon: Lemar Lofty., MD;  Location[facility] ENDOSCOPY;  Service: Gastroenterology;  Laterality: N/A;   SPYGLASS CHOLANGIOSCOPY N/A 10/09/2020   Procedure: SPYGLASS CHOLANGIOSCOPY;  Surgeon: Lemar Lofty., MD;  [facility];  Service: Gastroenterology;  Laterality: N/A;   SPYGLASS LITHOTRIPSY N/A 07/23/2020   Procedure: [id number] LITHOTRIPSY;  Surgeon: Lemar Lofty., MD;  Location[facility];  Service: Gastroenterology;  Laterality: N/A;   STENT REMOVAL  07/23/2020   Procedure: STENT REMOVAL;  Surgeon: Lemar Lofty., MD;  [facility];  Service: Gastroenterology;;   [name] REMOVAL  10/09/2020   Procedure: STENT REMOVAL;  Surgeon: Lemar Lofty., MD;  [facility];  Service: Gastroenterology;;    SOCIAL HISTORY: Social History   Socioeconomic History   Marital status: Married    Spouse name: Not on file   Number of children: 3   Years of education: Not on file   Highest education level: Not on file  Occupational History   Occupation: retired  Tobacco Use   Smoking status: Never   Smokeless tobacco: Never  Vaping Use   Vaping Use: Never used  Substance and Sexual Activity   Alcohol use: No   Drug use: No   Sexual activity: Not Currently  Other Topics Concern   Not on file  Social History Narrative   Born in [place]. [place] to [place] about 1991.   Social Determinants of Health   Financial Resource Strain: Not on file  Food Insecurity: Not on file  Transportation Needs: Not on file  Physical Activity: Not on file  Stress: Not on file  Social Connections: Not on file  Intimate Partner Violence: Not on file    FAMILY HISTORY: Family History  Problem Relation Age of Onset   Hypertension Mother    Hypertension Father    Diabetes  Sister    Gallstones Other    Hyperlipidemia Daughter    Other Neg Hx    Colon cancer Neg Hx    Esophageal cancer Neg Hx    Inflammatory bowel disease Neg Hx    Liver disease Neg Hx    Pancreatic cancer Neg Hx    Rectal cancer Neg Hx    Stomach cancer Neg Hx     ALLERGIES:  is allergic to aspirin, amoxicillin, and clarithromycin.  MEDICATIONS:  Current Outpatient Medications  Medication Sig Dispense Refill   lisinopril-hydrochlorothiazide (ZESTORETIC) 20-25 MG tablet Take 1 tablet by mouth daily. 90 tablet 3   prednisoLONE acetate (PRED FORTE) 1 % ophthalmic suspension Place 1 drop into the right eye daily.     rosuvastatin (CRESTOR) 40 MG tablet Take 1 tablet (40 mg total) by mouth daily. 90 tablet 3   vitamin B-12 (CYANOCOBALAMIN) 1000 MCG tablet Take 1 tablet (1,000 mcg total) by mouth daily. 30 tablet 3   Calcium-Magnesium-Vitamin D (CALCIUM 1200+D3 PO) Take 1 tablet by mouth daily. (Patient not taking: Reported on 01/19/2022)     No current facility-administered medications for this visit.    REVIEW OF SYSTEMS:   Constitutional: ( - ) fevers, ( - )  chills , ( - ) night sweats Eyes: ( - ) blurriness of vision, ( - ) double vision, ( - ) watery eyes Ears, nose, mouth, throat, and face: ( - ) mucositis, ( - ) sore  throat Respiratory: ( - ) cough, ( - ) dyspnea, ( - ) wheezes Cardiovascular: ( - ) palpitation, ( - ) chest discomfort, ( - ) lower extremity swelling Gastrointestinal:  ( - ) nausea, ( - ) heartburn, ( - ) change in bowel habits Skin: ( - ) abnormal skin rashes Lymphatics: ( - ) new lymphadenopathy, ( - ) easy bruising Neurological: ( - ) numbness, ( - ) tingling, ( - ) new weaknesses Behavioral/Psych: ( - ) mood change, ( - ) new changes  All other systems were reviewed with the patient and are negative.  PHYSICAL EXAMINATION: ECOG PERFORMANCE STATUS: 0 - Asymptomatic  Vitals:   02/04/22 1514  BP: (!) 141/54  Pulse: 83  Resp: 18  Temp: 97.7 F (36.5 C)   SpO2: 100%   Filed Weights   02/04/22 1514  Weight: 123 lb 14.4 oz (56.2 kg)    GENERAL: well appearing female in NAD  SKIN: skin color, texture, turgor are normal, no rashes or significant lesions EYES: conjunctiva are pink and non-injected, sclera clear LYMPH:  no palpable lymphadenopathy in the cervical or supraclavicular lymph nodes.  LUNGS: clear to auscultation and percussion with normal breathing effort HEART: regular rate & rhythm and no murmurs and no lower extremity edema Musculoskeletal: no cyanosis of digits and no clubbing  PSYCH: alert & oriented x 3, fluent speech NEURO: no focal motor/sensory deficits  LABORATORY DATA:  I have reviewed the data as listed    Latest Ref Rng & Units 01/20/2022    8:59 AM 04/04/2021    3:04 PM 02/26/2021   11:58 AM  CBC  WBC 4.0 - 10.5 K/uL 6.2  5.4  5.6   Hemoglobin 12.0 - 15.0 g/dL 44.3  15.4  00.8   Hematocrit 36.0 - 46.0 % 33.5  33.8  32.7   Platelets 150.0 - 400.0 K/uL 194.0  178  185.0        Latest Ref Rng & Units 01/20/2022    8:59 AM 04/04/2021    3:04 PM 11/15/2020    3:28 PM  CMP  Glucose 70 - 99 mg/dL 676  98  195   BUN 6 - 23 mg/dL 32  44  28   Creatinine 0.40 - 1.20 mg/dL 0.93  2.67  1.24   Sodium 135 - 145 mEq/L 141  140  141   Potassium 3.5 - 5.1 mEq/L 4.2  4.0  3.8   Chloride 96 - 112 mEq/L 103  103  100   CO2 19 - 32 mEq/L 30  30  32   Calcium 8.4 - 10.5 mg/dL 9.5  9.9  9.2   Total Protein 6.0 - 8.3 g/dL 7.4  8.1  7.6   Total Bilirubin 0.2 - 1.2 mg/dL 0.6  0.4  0.4   Alkaline Phos 39 - 117 U/L 37  36  42   AST 0 - 37 U/L 18  21  20    ALT 0 - 35 U/L 11  16  24      RADIOGRAPHIC STUDIES: I have personally reviewed the radiological images as listed and agreed with the findings in the report. No results found.  ASSESSMENT & PLAN Becky Turner is a 76 y.o. female returns for a follow up for normocytic anemia.  #Normocytic anemia: --Etiology unknown but anemia started around June 2022.  --Patient has Stage  3 chronic kidney disease which is a contributing risk factor.  --Labs from 01/22/2021 shows no evidence of iron deficiency. Labs from 04/04/2021  showed borderline normal vitamin b12 levels, normal folate levels and no evidence of paraproteinemia.  --Reviewed labs from 01/20/2022 that showed stable anemia with Hgb 10.8, MCV 89.9.  --We discussed continued monitoring versus pursuing further workup including evaluate for hemolysis, copper deficiency, bone marrow disorders. Patient declined further workup at this time.  --Patient will return in 6 months with repeat labs.   Orders Placed This Encounter  Procedures   CBC with Differential (Cancer Center Only)    Standing Status:   Future    Standing Expiration Date:   02/05/2023   CMP (Cancer Center only)    Standing Status:   Future    Standing Expiration Date:   02/05/2023   Vitamin B12    Standing Status:   Future    Standing Expiration Date:   02/04/2023   Copper, serum    Standing Status:   Future    Standing Expiration Date:   02/04/2023   Haptoglobin    Standing Status:   Future    Standing Expiration Date:   02/04/2023   Lactate dehydrogenase (LDH)    Standing Status:   Future    Standing Expiration Date:   02/04/2023    All questions were answered. The patient knows to call the clinic with any problems, questions or concerns.  I have spent a total of 25 minutes minutes of face-to-face and non-face-to-face time, preparing to see the patient,  performing a medically appropriate examination, counseling and educating the patient,documenting clinical information in the electronic health record, and care coordination.   Georga Kaufmann, PA-C Department of Hematology/Oncology [facility] Phone: [phone]

## 2022-03-03 ENCOUNTER — Telehealth: Payer: Self-pay | Admitting: Family Medicine

## 2022-03-03 NOTE — Telephone Encounter (Signed)
Left message for patient to call back and schedule Medicare Annual Wellness Visit (AWV) either virtually or in office. Left  my Herbie Drape number [phone]   *due 02/10/2011 awvi PER PALMETTO please schedule with Nurse Health Adviser   45 min for awv-i  in office appointments 30 min for awv-s & awv-i phone/virtual appointments

## 2022-03-13 ENCOUNTER — Ambulatory Visit (INDEPENDENT_AMBULATORY_CARE_PROVIDER_SITE_OTHER): Payer: Medicare Other

## 2022-03-13 VITALS — Ht 61.0 in | Wt 126.0 lb

## 2022-03-13 DIAGNOSIS — Z0000 Encounter for general adult medical examination without abnormal findings: Secondary | ICD-10-CM

## 2022-03-13 NOTE — Progress Notes (Signed)
I connected with Christyna Paskett today by telephone and verified that I am speaking with the correct person using two identifiers. Location patient: home Location provider: work Persons participating in the virtual visit: Mitzi Breuer, Terri (daughter), Glenna Durand LPN.   I discussed the limitations, risks, security and privacy concerns of performing an evaluation and management service by telephone and the availability of in person appointments. I also discussed with the patient that there may be a patient responsible charge related to this service. The patient expressed understanding and verbally consented to this telephonic visit.    Interactive audio and video telecommunications were attempted between this provider and patient, however failed, due to patient having technical difficulties OR patient did not have access to video capability.  We continued and completed visit with audio only.     Vital signs may be patient reported or missing.  Subjective:   Pegah Segel is a 77 y.o. female who presents for an Initial Medicare Annual Wellness Visit.  Review of Systems     Cardiac Risk Factors include: advanced age (>104men, >63 women);dyslipidemia;hypertension     Objective:    Today's Vitals   03/13/22 1507  Weight: 126 lb (57.2 kg)  Height: 5\' 1"  (1.549 m)   Body mass index is 23.81 kg/m.     03/13/2022    3:13 PM 04/04/2021    2:16 PM 10/09/2020    9:10 AM 07/19/2020    4:24 PM 09/08/2015   11:47 AM  Advanced Directives  Does Patient Have a Medical Advance Directive? No No No No No  Would patient like information on creating a medical advance directive?   Yes (MAU/Ambulatory/Procedural Areas - Information given) No - Patient declined No - patient declined information    Current Medications (verified) Outpatient Encounter Medications as of 03/13/2022  Medication Sig   lisinopril-hydrochlorothiazide (ZESTORETIC) 20-25 MG tablet Take 1 tablet by mouth daily.   prednisoLONE acetate (PRED  FORTE) 1 % ophthalmic suspension Place 1 drop into the right eye daily.   rosuvastatin (CRESTOR) 40 MG tablet Take 1 tablet (40 mg total) by mouth daily.   vitamin B-12 (CYANOCOBALAMIN) 1000 MCG tablet Take 1 tablet (1,000 mcg total) by mouth daily.   No facility-administered encounter medications on file as of 03/13/2022.    Allergies (verified) Aspirin, Amoxicillin, and Clarithromycin   History: Past Medical History:  Diagnosis Date   Anemia    Hyperlipidemia, familial, high LDL 01/30/2019   Hypertension    Prediabetes    Past Surgical History:  Procedure Laterality Date   BILIARY DILATION  10/09/2020   Procedure: BILIARY DILATION;  Surgeon: Rush Landmark Telford Nab., MD;  Location: [facility];  Service: Gastroenterology;;   BILIARY STENT PLACEMENT  07/20/2020   Procedure: BILIARY STENT PLACEMENT;  Surgeon: Ladene Artist, MD;  Location: [place];  Service: Endoscopy;;   BILIARY STENT PLACEMENT  07/23/2020   Procedure: BILIARY STENT PLACEMENT;  Surgeon: Irving Copas., MD;  Location: [place];  Service: Gastroenterology;;   BIOPSY  07/23/2020   Procedure: BIOPSY;  Surgeon: Irving Copas., MD;  Location: [place];  Service: Gastroenterology;;   BIOPSY  10/09/2020   Procedure: BIOPSY;  Surgeon: Irving Copas., MD;  Location: [facility];  Service: Gastroenterology;;   CATARACT EXTRACTION, BILATERAL  2019   CHOLECYSTECTOMY N/A 07/24/2020   Procedure: LAPAROSCOPIC CHOLECYSTECTOMY;  Surgeon: Georganna Skeans, MD;  Location: [place];  Service: General;  Laterality: N/A;   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   ENDOSCOPIC RETROGRADE CHOLANGIOPANCREATOGRAPHY (  ERCP) WITH PROPOFOL N/A 07/23/2020   Procedure: ENDOSCOPIC RETROGRADE CHOLANGIOPANCREATOGRAPHY (ERCP) WITH PROPOFOL;  Surgeon: Meridee Score Netty Starring., MD;  Location: [facility] ENDOSCOPY;  Service: Gastroenterology;  Laterality: N/A;   ENDOSCOPIC RETROGRADE CHOLANGIOPANCREATOGRAPHY (ERCP) WITH  PROPOFOL N/A 10/09/2020   Procedure: ENDOSCOPIC RETROGRADE CHOLANGIOPANCREATOGRAPHY (ERCP) WITH PROPOFOL;  Surgeon: Meridee Score Netty Starring., MD;  Location: [facility] ENDOSCOPY;  Service: Gastroenterology;  Laterality: N/A;   ERCP N/A 07/20/2020   Procedure: ENDOSCOPIC RETROGRADE CHOLANGIOPANCREATOGRAPHY (ERCP);  Surgeon: Meryl Dare, MD;  Location: [facility] ENDOSCOPY;  Service: Endoscopy;  Laterality: N/A;   HEMOSTASIS CONTROL  07/20/2020   Procedure: HEMOSTASIS CONTROL;  Surgeon: Meryl Dare, MD;  Location: [facility] ENDOSCOPY;  Service: Endoscopy;;   REMOVAL OF STONES  07/20/2020   Procedure: REMOVAL OF STONES;  Surgeon: Meryl Dare, MD;  Location: [facility] ENDOSCOPY;  Service: Endoscopy;;   REMOVAL OF STONES  07/23/2020   Procedure: REMOVAL OF STONES;  Surgeon: Lemar Lofty., MD;  Location: [facility] ENDOSCOPY;  Service: Gastroenterology;;   REMOVAL OF STONES  10/09/2020   Procedure: REMOVAL OF STONES;  Surgeon: Lemar Lofty., MD;  Location: [facility];  Service: Gastroenterology;;   [name]  07/20/2020   Procedure: [name];  Surgeon: Meryl Dare, MD;  Location: [facility] END[facility]COPY;  Service: Endoscopy;;   SPYGLASS CHOLANGIOSCOPY N/A 07/23/2020   Procedure: NWGNFAOZ CHOLANGIOSCOPY;  Surgeon: Lemar Lofty., MD;  Location: [facility] ENDOSCOPY;  Service: Gastroenterology;  Laterality: N/A;   SPYGLASS CHOLANGIOSCOPY N/A 10/09/2020   Procedure: SPYGLASS CHOLANGIOSCOPY;  Surgeon: Lemar Lofty., MD;  Location: [facility]OS[facility];  Service: Gastroenterology;  Laterality: N/A;   SPYGLASS LITHOTRIPSY N/A 07/23/2020   Procedure: HYQMVHQI LITHOTRIPSY;  Surgeon: Lemar Lofty., MD;  Location: [facility] ENDOSCOPY;  Service: Gastroenterology;  Laterality: N/A;   STENT REMOVAL  07/23/2020   Procedure: STENT REMOVAL;  Surgeon: Lemar Lofty., MD;  Location: [facility] ENDOSCOPY;  Service: Gastroenterology;;   [name] REMOVAL  10/09/2020   Procedure: STENT REMOVAL;  Surgeon:  Lemar Lofty., MD;  Location: [facility];  Service: Gastroenterology;;   Family History  Problem Relation Age of Onset   Hypertension Mother    Hypertension Father    Diabetes Sister    Gallstones Other    Hyperlipidemia Daughter    Other Neg Hx    Colon cancer Neg Hx    Esophageal cancer Neg Hx    Inflammatory bowel disease Neg Hx    Liver disease Neg Hx    Pancreatic cancer Neg Hx    Rectal cancer Neg Hx    Stomach cancer Neg Hx    Social History   Socioeconomic History   Marital status: Married    Spouse name: Not on file   Number of children: 3   Years of education: Not on file   Highest education level: Not on file  Occupational History   Occupation: retired  Tobacco Use   Smoking status: Never   Smokeless tobacco: Never  Vaping Use   Vaping Use: Never used  Substance and Sexual Activity   Alcohol use: No   Drug use: No   Sexual activity: Not Currently  Other Topics Concern   Not on file  Social History Narrative   Born in [place]. [place] to [place] about [place].   Social Determinants of Health   Financial Resource Strain: Low Risk  (03/13/2022)   Overall Financial Resource Strain (CARDIA)    Difficulty of Paying Living Expenses: Not hard at all  Food Insecurity: No Food Insecurity (03/13/2022)   Hunger Vital  Sign    Worried About Charity fundraiser in the Last Year: Never true    [place] in the Last Year: Never true  Transportation Needs: No Transportation Needs (03/13/2022)   PRAPARE - Hydrologist (Medical): No    Lack of Transportation (Non-Medical): No  Physical Activity: Insufficiently Active (03/13/2022)   Exercise Vital Sign    Days of Exercise per Week: 7 days    Minutes of Exercise per Session: 10 min  Stress: No Stress Concern Present (03/13/2022)   [place]    Feeling of Stress : Not at all  Social Connections: Not on file    Tobacco  Counseling Counseling given: Not Answered   Clinical Intake:  Pre-visit preparation completed: Yes  Pain : No/denies pain     Nutritional Status: BMI of 19-24  Normal Nutritional Risks: None Diabetes: No  How often do you need to have someone help you when you read instructions, pamphlets, or other written materials from your doctor or pharmacy?: 4 - Often  Diabetic? no  Interpreter Needed?: No  Comments: daughter interpreted Information entered by :: NAllen LPN   Activities of Daily Living    03/13/2022    3:14 PM  In your present state of health, do you have any difficulty performing the following activities:  Hearing? 0  Vision? 0  Difficulty concentrating or making decisions? 0  Walking or climbing stairs? 0  Dressing or bathing? 0  Doing errands, shopping? 0  Preparing Food and eating ? N  Using the Toilet? N  In the past six months, have you accidently leaked urine? N  Do you have problems with loss of bowel control? N  Managing your Medications? N  Managing your Finances? N  Housekeeping or managing your Housekeeping? N    Patient Care Team: Haydee Salter, MD as PCP - General (Family Medicine) Mansouraty, Telford Nab., MD as Consulting Physician (Gastroenterology) Adrian Prows, MD as Consulting Physician (Cardiology) Marilynne Halsted, MD as Referring Physician (Ophthalmology) Cordelia Poche as Physician Assistant (Hematology and Oncology)  Indicate any recent Medical Services you may have received from other than [facility] providers in the past year (date may be approximate).     Assessment:   This is a routine wellness examination for Isabelle.  Hearing/Vision screen Vision Screening - Comments:: Regular eye exams, [place]  Dietary issues and exercise activities discussed: Current Exercise Habits: Home exercise routine, Type of exercise: walking, Time (Minutes): 10, Frequency (Times/Week): 7, Weekly Exercise (Minutes/Week): 70    Goals Addressed             This Visit's Progress    Patient Stated       03/13/2022, no goals       Depression Screen    03/13/2022    3:14 PM 01/19/2022   10:26 AM 09/09/2020    9:59 AM 10/03/2016   11:43 AM 10/05/2015   10:16 AM 09/20/2015   11:28 AM 09/14/2015    8:13 AM  PHQ 2/9 Scores  PHQ - 2 Score 0 0 0 0 0 0 0    Fall Risk    03/13/2022    3:14 PM 01/19/2022   10:26 AM 09/09/2020    9:59 AM 10/03/2016   11:43 AM 10/05/2015   10:16 AM  [place] in the past year? 0 0 0 No No  Number falls in past  yr: 0 0 0    Injury with Fall? 0 0 0    Risk for fall due to : Medication side effect No Fall Risks No Fall Risks    Follow up Falls prevention discussed;Education provided;Falls evaluation completed Falls evaluation completed Falls evaluation completed      FALL RISK PREVENTION PERTAINING TO THE HOME:  Any stairs in or around the home? No  If so, are there any without handrails? N/a Home free of loose throw rugs in walkways, pet beds, electrical cords, etc? Yes  Adequate lighting in your home to reduce risk of falls? Yes   ASSISTIVE DEVICES UTILIZED TO PREVENT FALLS:  Life alert? No  Use of a cane, walker or w/c? No  Grab bars in the bathroom? Yes  Shower chair or bench in shower? Yes  Elevated toilet seat or a handicapped toilet? No   TIMED UP AND GO:  Was the test performed? No .      Cognitive Function:  6 CIT not administered due to language barrier. Patient appeared cognitive per conversation via her daughter.        Immunizations Immunization History  Administered Date(s) Administered   Influenza, High Dose Seasonal PF 12/25/2018   Influenza-Unspecified 12/29/2020, 12/29/2021   PFIZER(Purple Top)SARS-COV-2 Vaccination 04/21/2019, 05/15/2019, 11/15/2019   PNEUMOCOCCAL CONJUGATE-20 09/09/2020   Pfizer Covid-19 Vaccine Bivalent Booster 80yrs & up 01/04/2021    TDAP status: Due, Education has been provided regarding the importance of this  vaccine. Advised may receive this vaccine at local pharmacy or Health Dept. Aware to provide a copy of the vaccination record if obtained from local pharmacy or Health Dept. Verbalized acceptance and understanding.  Flu Vaccine status: Up to date  Pneumococcal vaccine status: Up to date  Covid-19 vaccine status: Completed vaccines  Qualifies for Shingles Vaccine? Yes   Zostavax completed No   Shingrix Completed?: No.    Education has been provided regarding the importance of this vaccine. Patient has been advised to call insurance company to determine out of pocket expense if they have not yet received this vaccine. Advised may also receive vaccine at local pharmacy or Health Dept. Verbalized acceptance and understanding.  Screening Tests Health Maintenance  Topic Date Due   Medicare Annual Wellness (AWV)  Never done   DTaP/Tdap/Td (1 - Tdap) Never done   Zoster Vaccines- Shingrix (1 of 2) Never done   COVID-19 Vaccine (5 - 2023-24 season) 10/10/2021   Pneumonia Vaccine 71+ Years old  Completed   INFLUENZA VACCINE  Completed   DEXA SCAN  Completed   Hepatitis C Screening  Completed   HPV VACCINES  Aged Out    Health Maintenance  Health Maintenance Due  Topic Date Due   Medicare Annual Wellness (AWV)  Never done   DTaP/Tdap/Td (1 - Tdap) Never done   Zoster Vaccines- Shingrix (1 of 2) Never done   COVID-19 Vaccine (5 - 2023-24 season) 10/10/2021    Colorectal cancer screening: No longer required.   Mammogram status: No longer required due to age.  Bone Density status: Completed 02/21/2021.   Lung Cancer Screening: (Low Dose CT Chest recommended if Age 30-80 years, 30 pack-year currently smoking OR have quit w/in 15years.) does not qualify.   Lung Cancer Screening Referral: no  Additional Screening:  Hepatitis C Screening: does qualify; Completed 09/14/2015  Vision Screening: Recommended annual ophthalmology exams for early detection of glaucoma and other disorders of the  eye. Is the patient up to date with their annual eye exam?  Yes  Who is the provider or what is the name of the office in which the patient attends annual eye exams? [facility]  If pt is not established with a provider, would they like to be referred to a provider to establish care? No .   Dental Screening: Recommended annual dental exams for proper oral hygiene  Community Resource Referral / Chronic Care Management: CRR required this visit?  No   CCM required this visit?  No      Plan:     I have personally reviewed and noted the following in the patient's chart:   Medical and social history Use of alcohol, tobacco or illicit drugs  Current medications and supplements including opioid prescriptions. Patient is not currently taking opioid prescriptions. Functional ability and status Nutritional status Physical activity Advanced directives List of other physicians Hospitalizations, surgeries, and ER visits in previous 12 months Vitals Screenings to include cognitive, depression, and falls Referrals and appointments  In addition, I have reviewed and discussed with patient certain preventive protocols, quality metrics, and best practice recommendations. A written personalized care plan for preventive services as well as general preventive health recommendations were provided to patient.     Kellie Simmering, LPN   0/10/8117   Nurse Notes: none  Due to this being a virtual visit, the after visit summary with patients personalized plan was offered to patient via mail or my-chart. Patient would like to access on my-chart

## 2022-03-13 NOTE — Patient Instructions (Signed)
Becky Turner , Thank you for taking time to come for your Medicare Wellness Visit. I appreciate your ongoing commitment to your health goals. Please review the following plan we discussed and let me know if I can assist you in the future.   These are the goals we discussed:  Goals      Patient Stated     03/13/2022, no goals        This is a list of the screening recommended for you and due dates:  Health Maintenance  Topic Date Due   DTaP/Tdap/Td vaccine (1 - Tdap) Never done   Zoster (Shingles) Vaccine (1 of 2) Never done   COVID-19 Vaccine (5 - 2023-24 season) 10/10/2021   Medicare Annual Wellness Visit  03/14/2023   Pneumonia Vaccine  Completed   Flu Shot  Completed   DEXA scan (bone density measurement)  Completed   Hepatitis C Screening: USPSTF Recommendation to screen - Ages 2-79 yo.  Completed   HPV Vaccine  Aged Out    Advanced directives: Advance directive discussed with you today.   Conditions/risks identified: none  Next appointment: Follow up in one year for your annual wellness visit    Preventive Care 65 Years and Older, Female Preventive care refers to lifestyle choices and visits with your health care provider that can promote health and wellness. What does preventive care include? A yearly physical exam. This is also called an annual well check. Dental exams once or twice a year. Routine eye exams. Ask your health care provider how often you should have your eyes checked. Personal lifestyle choices, including: Daily care of your teeth and gums. Regular physical activity. Eating a healthy diet. Avoiding tobacco and drug use. Limiting alcohol use. Practicing safe sex. Taking low-dose aspirin every day. Taking vitamin and mineral supplements as recommended by your health care provider. What happens during an annual well check? The services and screenings done by your health care provider during your annual well check will depend on your age, overall health,  lifestyle risk factors, and family history of disease. Counseling  Your health care provider may ask you questions about your: Alcohol use. Tobacco use. Drug use. Emotional well-being. Home and relationship well-being. Sexual activity. Eating habits. History of falls. Memory and ability to understand (cognition). Work and work Statistician. Reproductive health. Screening  You may have the following tests or measurements: Height, weight, and BMI. Blood pressure. Lipid and cholesterol levels. These may be checked every 5 years, or more frequently if you are over 69 years old. Skin check. Lung cancer screening. You may have this screening every year starting at age 9 if you have a 30-pack-year history of smoking and currently smoke or have quit within the past 15 years. Fecal occult blood test (FOBT) of the stool. You may have this test every year starting at age 41. Flexible sigmoidoscopy or colonoscopy. You may have a sigmoidoscopy every 5 years or a colonoscopy every 10 years starting at age 65. Hepatitis C blood test. Hepatitis B blood test. Sexually transmitted disease (STD) testing. Diabetes screening. This is done by checking your blood sugar (glucose) after you have not eaten for a while (fasting). You may have this done every 1-3 years. Bone density scan. This is done to screen for osteoporosis. You may have this done starting at age 39. Mammogram. This may be done every 1-2 years. Talk to your health care provider about how often you should have regular mammograms. Talk with your health care provider about  your test results, treatment options, and if necessary, the need for more tests. Vaccines  Your health care provider may recommend certain vaccines, such as: Influenza vaccine. This is recommended every year. Tetanus, diphtheria, and acellular pertussis (Tdap, Td) vaccine. You may need a Td booster every 10 years. Zoster vaccine. You may need this after age  31. Pneumococcal 13-valent conjugate (PCV13) vaccine. One dose is recommended after age 45. Pneumococcal polysaccharide (PPSV23) vaccine. One dose is recommended after age 35. Talk to your health care provider about which screenings and vaccines you need and how often you need them. This information is not intended to replace advice given to you by your health care provider. Make sure you discuss any questions you have with your health care provider. Document Released: 02/22/2015 Document Revised: 10/16/2015 Document Reviewed: 11/27/2014 Elsevier Interactive Patient Education  2017 [place] Prevention in the Home Falls can cause injuries. They can happen to people of all ages. There are many things you can do to make your home safe and to help prevent falls. What can I do on the outside of my home? Regularly fix the edges of walkways and driveways and fix any cracks. Remove anything that might make you trip as you walk through a door, such as a raised step or threshold. Trim any bushes or trees on the path to your home. Use bright outdoor lighting. Clear any walking paths of anything that might make someone trip, such as rocks or tools. Regularly check to see if handrails are loose or broken. Make sure that both sides of any steps have handrails. Any raised decks and porches should have guardrails on the edges. Have any leaves, snow, or ice cleared regularly. Use sand or salt on walking paths during winter. Clean up any spills in your garage right away. This includes oil or grease spills. What can I do in the bathroom? Use night lights. Install grab bars by the toilet and in the tub and shower. Do not use towel bars as grab bars. Use non-skid mats or decals in the tub or shower. If you need to sit down in the shower, use a plastic, non-slip stool. Keep the floor dry. Clean up any water that spills on the floor as soon as it happens. Remove soap buildup in the tub or shower  regularly. Attach bath mats securely with double-sided non-slip rug tape. Do not have throw rugs and other things on the floor that can make you trip. What can I do in the bedroom? Use night lights. Make sure that you have a light by your bed that is easy to reach. Do not use any sheets or blankets that are too big for your bed. They should not hang down onto the floor. Have a firm chair that has side arms. You can use this for support while you get dressed. Do not have throw rugs and other things on the floor that can make you trip. What can I do in the kitchen? Clean up any spills right away. Avoid walking on wet floors. Keep items that you use a lot in easy-to-reach places. If you need to reach something above you, use a strong step stool that has a grab bar. Keep electrical cords out of the way. Do not use floor polish or wax that makes floors slippery. If you must use wax, use non-skid floor wax. Do not have throw rugs and other things on the floor that can make you trip. What can I do with  my stairs? Do not leave any items on the stairs. Make sure that there are handrails on both sides of the stairs and use them. Fix handrails that are broken or loose. Make sure that handrails are as long as the stairways. Check any carpeting to make sure that it is firmly attached to the stairs. Fix any carpet that is loose or worn. Avoid having throw rugs at the top or bottom of the stairs. If you do have throw rugs, attach them to the floor with carpet tape. Make sure that you have a light switch at the top of the stairs and the bottom of the stairs. If you do not have them, ask someone to add them for you. What else can I do to help prevent falls? Wear shoes that: Do not have high heels. Have rubber bottoms. Are comfortable and fit you well. Are closed at the toe. Do not wear sandals. If you use a stepladder: Make sure that it is fully opened. Do not climb a closed stepladder. Make sure that  both sides of the stepladder are locked into place. Ask someone to hold it for you, if possible. Clearly mark and make sure that you can see: Any grab bars or handrails. First and last steps. Where the edge of each step is. Use tools that help you move around (mobility aids) if they are needed. These include: Canes. Walkers. Scooters. Crutches. Turn on the lights when you go into a dark area. Replace any light bulbs as soon as they burn out. Set up your furniture so you have a clear path. Avoid moving your furniture around. If any of your floors are uneven, fix them. If there are any pets around you, be aware of where they are. Review your medicines with your doctor. Some medicines can make you feel dizzy. This can increase your chance of falling. Ask your doctor what other things that you can do to help prevent falls. This information is not intended to replace advice given to you by your health care provider. Make sure you discuss any questions you have with your health care provider. Document Released: 11/22/2008 Document Revised: 07/04/2015 Document Reviewed: 03/02/2014 Elsevier Interactive Patient Education  2017 [employer].

## 2022-04-21 ENCOUNTER — Encounter: Payer: Self-pay | Admitting: Family Medicine

## 2022-07-21 ENCOUNTER — Encounter: Payer: Self-pay | Admitting: Family Medicine

## 2022-07-21 ENCOUNTER — Ambulatory Visit (INDEPENDENT_AMBULATORY_CARE_PROVIDER_SITE_OTHER): Payer: Medicare Other | Admitting: Family Medicine

## 2022-07-21 VITALS — BP 114/60 | HR 70 | Temp 98.0°F | Ht 61.0 in | Wt 119.2 lb

## 2022-07-21 DIAGNOSIS — E7849 Other hyperlipidemia: Secondary | ICD-10-CM

## 2022-07-21 DIAGNOSIS — I10 Essential (primary) hypertension: Secondary | ICD-10-CM

## 2022-07-21 DIAGNOSIS — D649 Anemia, unspecified: Secondary | ICD-10-CM

## 2022-07-21 DIAGNOSIS — R7989 Other specified abnormal findings of blood chemistry: Secondary | ICD-10-CM | POA: Diagnosis not present

## 2022-07-21 DIAGNOSIS — E1122 Type 2 diabetes mellitus with diabetic chronic kidney disease: Secondary | ICD-10-CM | POA: Diagnosis not present

## 2022-07-21 DIAGNOSIS — N189 Chronic kidney disease, unspecified: Secondary | ICD-10-CM | POA: Diagnosis not present

## 2022-07-21 DIAGNOSIS — N1832 Chronic kidney disease, stage 3b: Secondary | ICD-10-CM | POA: Diagnosis not present

## 2022-07-21 DIAGNOSIS — R42 Dizziness and giddiness: Secondary | ICD-10-CM

## 2022-07-21 DIAGNOSIS — E559 Vitamin D deficiency, unspecified: Secondary | ICD-10-CM

## 2022-07-21 LAB — CBC
HCT: 32.8 % — ABNORMAL LOW (ref 36.0–46.0)
Hemoglobin: 10.6 g/dL — ABNORMAL LOW (ref 12.0–15.0)
MCHC: 32.2 g/dL (ref 30.0–36.0)
MCV: 87.6 fl (ref 78.0–100.0)
Platelets: 223 10*3/uL (ref 150.0–400.0)
RBC: 3.74 Mil/uL — ABNORMAL LOW (ref 3.87–5.11)
RDW: 13.9 % (ref 11.5–15.5)
WBC: 6.7 10*3/uL (ref 4.0–10.5)

## 2022-07-21 LAB — COMPREHENSIVE METABOLIC PANEL
ALT: 7 U/L (ref 0–35)
AST: 15 U/L (ref 0–37)
Albumin: 4.5 g/dL (ref 3.5–5.2)
Alkaline Phosphatase: 46 U/L (ref 39–117)
BUN: 38 mg/dL — ABNORMAL HIGH (ref 6–23)
CO2: 27 mEq/L (ref 19–32)
Calcium: 9.3 mg/dL (ref 8.4–10.5)
Chloride: 99 mEq/L (ref 96–112)
Creatinine, Ser: 1.68 mg/dL — ABNORMAL HIGH (ref 0.40–1.20)
GFR: 29.18 mL/min — ABNORMAL LOW (ref >60.00)
Glucose, Bld: 113 mg/dL — ABNORMAL HIGH (ref 70–99)
Potassium: 3.7 mEq/L (ref 3.5–5.1)
Sodium: 138 mEq/L (ref 135–145)
Total Bilirubin: 0.6 mg/dL (ref 0.2–1.2)
Total Protein: 7.9 g/dL (ref 6.0–8.3)

## 2022-07-21 LAB — VITAMIN B12: Vitamin B-12: 287 pg/mL (ref 211–911)

## 2022-07-21 LAB — EXTRA LAV TOP TUBE

## 2022-07-21 LAB — HEMOGLOBIN A1C: Hgb A1c MFr Bld: 6.8 % — ABNORMAL HIGH (ref 4.6–6.5)

## 2022-07-21 LAB — VITAMIN D 25 HYDROXY (VIT D DEFICIENCY, FRACTURES): VITD: 13.28 ng/mL — ABNORMAL LOW (ref 30.00–100.00)

## 2022-07-21 LAB — PHOSPHORUS: Phosphorus: 3.6 mg/dL (ref 2.3–4.6)

## 2022-07-21 MED ORDER — LISINOPRIL-HYDROCHLOROTHIAZIDE 20-12.5 MG PO TABS
1.0000 | ORAL_TABLET | Freq: Every day | ORAL | 3 refills | Status: DC
Start: 2022-07-21 — End: 2022-10-21

## 2022-07-21 MED ORDER — VITAMIN D (ERGOCALCIFEROL) 1.25 MG (50000 UNIT) PO CAPS
50000.0000 [IU] | ORAL_CAPSULE | ORAL | 0 refills | Status: DC
Start: 2022-07-21 — End: 2023-01-20

## 2022-07-21 NOTE — Assessment & Plan Note (Signed)
We will reassess her anemia today. As she has had a borderline low B12 in the past, I will recheck this.

## 2022-07-21 NOTE — Assessment & Plan Note (Signed)
Blood pressure is in good control, but Becky Turner is having some orthostasis. I will decrease her dose to 20-12.5 mg daily to see if this will reduce her dizziness.

## 2022-07-21 NOTE — Assessment & Plan Note (Signed)
I will repeat her renal functions today, as well as screen for potential complications associated with CKD.

## 2022-07-21 NOTE — Assessment & Plan Note (Signed)
Last A1c was 6.7%. I will repeat her glucose and A1c today.

## 2022-07-21 NOTE — Addendum Note (Signed)
Addended by: Loyola Mast on: 07/21/2022 03:01 PM   Modules accepted: Orders

## 2022-07-21 NOTE — Assessment & Plan Note (Signed)
Has had some issues with cholestasis. We will reassess LFTs today.

## 2022-07-21 NOTE — Assessment & Plan Note (Signed)
I will repeat her lipids today to see how the higher dosage of rosuvastatin has helped.

## 2022-07-21 NOTE — Assessment & Plan Note (Signed)
Dizziness sounds like orthostasis. I will reduce her Zestoretic dose.

## 2022-07-21 NOTE — Progress Notes (Signed)
[name] [address] [place] [address] Dept: [phone] Dept Fax: [phone]  Chronic Care Office Visit  Subjective:    Patient ID: Becky Turner, female    DOB: 12/08/1945, 77 y.o..   MRN: [id number]  Chief Complaint  Patient presents with   Medical Management of Chronic Issues    6 month f/u.  Fasting today.  C/o having some dizziness when standing up off/on.     History of Present Illness:  Patient is in today for reassessment of chronic medical issues. Interpretation was provided by her daughter, Becky Turner. It is Becky Turner's preference to have her daughter fulfill this role.   Becky Turner has a history of hypertension. She is managed on Zestoretic (lisinopril/HCTZ) 20-25 mg daily. Recently, she has noted issues with lightheadedness with standing. This occurs primarily around noon. She typically takes her BP medicine in the morning.    Becky Turner has a history of hyperlipidemia. She is currently on rosuvastatin 40 mg daily. We increased her dose at her last visit.   Becky Turner has a history of prediabetes. Her last A1c was at 6.7%.   Becky Turner has a history of normocytic anemia. She has been seen by hematology. She was found to have some renal dysfunction and a borderline low Vit.B12. She has stage 3b CKD.  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   Elevated LFTs    Choledocholithiasis 07/19/2020   Chronic kidney disease, stage 3b (HCC) 07/19/2020   Corneal edema of right eye 09/22/2019   Hyperlipidemia, familial, high LDL 01/30/2019   Essential hypertension 09/10/2015   Dizziness and  giddiness 09/10/2015   Insomnia 09/10/2015   Prediabetes 09/10/2015   Disequilibrium 09/10/2015   Past Surgical History:  Procedure Laterality Date   BILIARY DILATION  10/09/2020   Procedure: BILIARY DILATION;  Surgeon: Lemar Lofty., MD;  Location: [facility] ENDOSCOPY;  Service: Gastroenterology;;   BILIARY STENT PLACEMENT  07/20/2020   Procedure: BILIARY STENT PLACEMENT;  Surgeon: Meryl Dare, MD;  Location: [facility] ENDOSCOPY;  Service: Endoscopy;;   BILIARY STENT PLACEMENT  07/23/2020   Procedure: BILIARY STENT PLACEMENT;  Surgeon: Lemar Lofty., MD;  Location: [facility] ENDOSCOPY;  Service: Gastroenterology;;   BIOPSY  07/23/2020   Procedure: BIOPSY;  Surgeon: Lemar Lofty., MD;  Location: [facility] ENDOSCOPY;  Service: Gastroenterology;;   BIOPSY  10/09/2020   Procedure: BIOPSY;  Surgeon: Lemar Lofty., MD;  Location: [facility] ENDOSCOPY;  Service: Gastroenterology;;   CATARACT EXTRACTION, BILATERAL  2019   CHOLECYSTECTOMY N/A 07/24/2020   Procedure: LAPAROSCOPIC CHOLECYSTECTOMY;  Surgeon: Violeta Gelinas, MD;  Location: [facility] OR;  Service: General;  Laterality: N/A;   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   ENDOSCOPIC RETROGRADE CHOLANGIOPANCREATOGRAPHY (ERCP) WITH PROPOFOL N/A 07/23/2020   Procedure: ENDOSCOPIC RETROGRADE CHOLANGIOPANCREATOGRAPHY (ERCP) WITH PROPOFOL;  Surgeon: Lemar Lofty., MD;  Location: [facility] ENDOSCOPY;  Service: Gastroenterology;  Laterality: N/A;   ENDOSCOPIC RETROGRADE CHOLANGIOPANCREATOGRAPHY (ERCP) WITH PROPOFOL N/A 10/09/2020   Procedure: ENDOSCOPIC RETROGRADE CHOLANGIOPANCREATOGRAPHY (ERCP) WITH PROPOFOL;  Surgeon: Meridee Score Netty Starring., MD;  Location: [facility] ENDOSCOPY;  Service: Gastroenterology;  Laterality: N/A;   ERCP N/A 07/20/2020   Procedure: ENDOSCOPIC  RETROGRADE CHOLANGIOPANCREATOGRAPHY (ERCP);  Surgeon: Meryl Dare, MD;  Location: [facility] ENDOSCOPY;  Service: Endoscopy;  Laterality: N/A;   HEMOSTASIS CONTROL  07/20/2020    Procedure: HEMOSTASIS CONTROL;  Surgeon: Meryl Dare, MD;  Location: [facility] ENDOSCOPY;  Service: Endoscopy;;   REMOVAL OF STONES  07/20/2020   Procedure: REMOVAL OF STONES;  Surgeon: Meryl Dare, MD;  Location: [facility] ENDOSCOPY;  Service: Endoscopy;;   REMOVAL OF STONES  07/23/2020   Procedure: REMOVAL OF STONES;  Surgeon: Lemar Lofty., MD;  Location: [facility] ENDOSCOPY;  Service: Gastroenterology;;   REMOVAL OF STONES  10/09/2020   Procedure: REMOVAL OF STONES;  Surgeon: Lemar Lofty., MD;  Location: [facility] ENDOSCOPY;  Service: Gastroenterology;;   [name]  07/20/2020   Procedure: [name];  Surgeon: Meryl Dare, MD;  Location: [facility] ENDOSCOPY;  Service: Endoscopy;;   SPYGLASS CHOLANGIOSCOPY N/A 07/23/2020   Procedure: [id number] CHOLANGIOSCOPY;  Surgeon: Lemar Lofty., MD;  Location: [facility] ENDOSCOPY;  Service: Gastroenterology;  Laterality: N/A;   SPYGLASS CHOLANGIOSCOPY N/A 10/09/2020   Procedure: SPYGLASS CHOLANGIOSCOPY;  Surgeon: Lemar Lofty., MD;  Location: [facility] ENDOSCOPY;  Service: Gastroenterology;  Laterality: N/A;   SPYGLASS LITHOTRIPSY N/A 07/23/2020   Procedure: [id number] LITHOTRIPSY;  Surgeon: Lemar Lofty., MD;  Location: [facility] ENDOSCOPY;  Service: Gastroenterology;  Laterality: N/A;   STENT REMOVAL  07/23/2020   Procedure: STENT REMOVAL;  Surgeon: Meridee Score Netty Starring., MD;  Location: [facility] ENDOSCOPY;  Service: Gastroenterology;;   [name] REMOVAL  10/09/2020   Procedure: STENT REMOVAL;  Surgeon: Lemar Lofty., MD;  Location: [facility] ENDOSCOPY;  Service: Gastroenterology;;   Family History  Problem Relation Age of Onset   Hypertension Mother    Hypertension Father    Diabetes Sister    Gallstones Other    Hyperlipidemia Daughter    Other Neg Hx    Colon cancer Neg Hx    Esophageal cancer Neg Hx    Inflammatory bowel disease Neg Hx    Liver disease Neg Hx    Pancreatic cancer Neg Hx    Rectal cancer Neg Hx    Stomach  cancer Neg Hx    Outpatient Medications Prior to Visit  Medication Sig Dispense Refill   prednisoLONE acetate (PRED FORTE) 1 % ophthalmic suspension Place 1 drop into the right eye daily.     rosuvastatin (CRESTOR) 40 MG tablet Take 1 tablet (40 mg total) by mouth daily. 90 tablet 3   vitamin B-12 (CYANOCOBALAMIN) 1000 MCG tablet Take 1 tablet (1,000 mcg total) by mouth daily. 30 tablet 3   lisinopril-hydrochlorothiazide (ZESTORETIC) 20-25 MG tablet Take 1 tablet by mouth daily. 90 tablet 3   No facility-administered medications prior to visit.   Allergies  Allergen Reactions   Aspirin Nausea And Vomiting   Amoxicillin Rash   Clarithromycin Rash   Objective:   Today's Vitals   07/21/22 0902  BP: 114/60  Pulse: 70  Temp: 98 F (36.7 C)  TempSrc: Temporal  SpO2: 97%  Weight: 119 lb 3.2 oz (54.1 kg)  Height: 5\' 1"  (1.549 m)   Body mass index is 22.52 kg/m.   General: Well developed, well nourished. No acute distress. Psych: Alert and oriented. Normal mood and affect.  Health Maintenance Due  Topic Date Due   DTaP/Tdap/Td (1 - Tdap) Never done   Zoster Vaccines- Shingrix (1 of 2) Never done     Assessment & Plan:   Problem List Items Addressed This Visit       Cardiovascular and Mediastinum  Essential hypertension - Primary    Blood pressure is in good control, but Ms. Bartell is having some orthostasis. I will decrease her dose to 20-12.5 mg daily to see if this will reduce her dizziness.      Relevant Medications   lisinopril-hydrochlorothiazide (ZESTORETIC) 20-12.5 MG tablet   Other Relevant Orders   Comprehensive metabolic panel     Genitourinary   Chronic kidney disease, stage 3b (HCC)    I will repeat her renal functions today, as well as screen for potential complications associated with CKD.      Relevant Orders   Comprehensive metabolic panel   Phosphorus   Parathyroid hormone, intact (no Ca)   VITAMIN D 25 Hydroxy (Vit-D Deficiency, Fractures)      Other   Dizziness and giddiness    Dizziness sounds like orthostasis. I will reduce her Zestoretic dose.      Prediabetes    Last A1c was 6.7%. I will repeat her glucose and A1c today.      Relevant Orders   Comprehensive metabolic panel   Hemoglobin A1c   Hyperlipidemia, familial, high LDL    I will repeat her lipids today to see how the higher dosage of rosuvastatin has helped.      Relevant Medications   lisinopril-hydrochlorothiazide (ZESTORETIC) 20-12.5 MG tablet   Elevated LFTs    Has had some issues with cholestasis. We will reassess LFTs today.      Relevant Orders   Comprehensive metabolic panel   Normocytic anemia    We will reassess her anemia today. As she has had a borderline low B12 in the past, I will recheck this.      Relevant Orders   CBC   Vitamin B12    Return in about 3 months (around 10/21/2022) for Reassessment.   Loyola Mast, MD

## 2022-07-22 ENCOUNTER — Encounter: Payer: Self-pay | Admitting: Family Medicine

## 2022-07-22 DIAGNOSIS — N2581 Secondary hyperparathyroidism of renal origin: Secondary | ICD-10-CM | POA: Insufficient documentation

## 2022-07-22 LAB — PARATHYROID HORMONE, INTACT (NO CA): PTH: 88 pg/mL — ABNORMAL HIGH (ref 16–77)

## 2022-08-07 ENCOUNTER — Ambulatory Visit: Payer: Medicare Other | Admitting: Hematology and Oncology

## 2022-08-07 ENCOUNTER — Other Ambulatory Visit: Payer: Medicare Other

## 2022-09-24 ENCOUNTER — Encounter (INDEPENDENT_AMBULATORY_CARE_PROVIDER_SITE_OTHER): Payer: Self-pay

## 2022-09-30 ENCOUNTER — Encounter: Payer: Self-pay | Admitting: Skilled Nursing Facility1

## 2022-09-30 ENCOUNTER — Encounter: Payer: Medicare Other | Attending: Family Medicine | Admitting: Skilled Nursing Facility1

## 2022-09-30 VITALS — Ht 61.0 in | Wt 117.0 lb

## 2022-09-30 DIAGNOSIS — N183 Chronic kidney disease, stage 3 unspecified: Secondary | ICD-10-CM | POA: Diagnosis not present

## 2022-09-30 DIAGNOSIS — E1122 Type 2 diabetes mellitus with diabetic chronic kidney disease: Secondary | ICD-10-CM | POA: Diagnosis not present

## 2022-09-30 NOTE — Progress Notes (Signed)
Diabetes Self-Management Education  Visit Type: First/Initial   09/30/2022  Ms. Becky Turner, identified by name and date of birth, is a 77 y.o. female with a diagnosis of Diabetes: Type 2.   ASSESSMENT  Height 5\' 1"  (1.549 m), weight 117 lb (53.1 kg). Body mass index is 22.11 kg/m.  Pt arrives with her daughter as her interpretor which she prefers.   Pt states she limits her rice to 1 cup per day for the last year. Pt states she only eats about 1 tsp of fish sauce.  Pts diet is well balanced and appropriate for CKD and DM the only tweak to her lifestyle she could make would be increasing her activity form 15 intentional minutes a day to 30 intentional minutes per day.    Diabetes Self-Management Education - 09/30/22 1359       Visit Information   Visit Type First/Initial      Initial Visit   Diabetes Type Type 2    Are you currently following a meal plan? No    Are you taking your medications as prescribed? Not on Medications      Health Coping   How would you rate your overall health? Good      Psychosocial Assessment   Patient Belief/Attitude about Diabetes Motivated to manage diabetes    What is the hardest part about your diabetes right now, causing you the most concern, or is the most worrisome to you about your diabetes?   Making healty food and beverage choices    Self-care barriers None    Self-management support Family    Other persons present Family Member    Patient Concerns Nutrition/Meal planning      Pre-Education Assessment   Patient understands the diabetes disease and treatment process. Needs Instruction    Patient understands incorporating nutritional management into lifestyle. Needs Instruction    Patient undertands incorporating physical activity into lifestyle. Needs Instruction    Patient understands using medications safely. Needs Instruction    Patient understands monitoring blood glucose, interpreting and using results Needs Instruction     Patient understands prevention, detection, and treatment of acute complications. Needs Instruction    Patient understands prevention, detection, and treatment of chronic complications. Needs Instruction    Patient understands how to develop strategies to address psychosocial issues. Needs Instruction    Patient understands how to develop strategies to promote health/change behavior. Needs Instruction      Complications   Last HgB A1C per patient/outside source 6.8 %    Have you had a dilated eye exam in the past 12 months? Yes    Have you had a dental exam in the past 12 months? No    Are you checking your feet? No      Dietary Intake   Breakfast vegetable soup with tofu    Snack (morning) crackers or fruit or avacado    Lunch 1/2 cup rice + chicken and fish    Snack (afternoon) crackers or fruit or avacado    Dinner 1/2 cup rice + chicken and fish    Beverage(s) water      Activity / Exercise   Activity / Exercise Type Light (walking / raking leaves)    How many days per week do you exercise? 15      Patient Education   Previous Diabetes Education No    Disease Pathophysiology Factors that contribute to the development of diabetes    Healthy Eating Food label reading, portion sizes and measuring  food.;Plate Method;Role of diet in the treatment of diabetes and the relationship between the three main macronutrients and blood glucose level    Being Active Role of exercise on diabetes management, blood pressure control and cardiac health.    Acute complications Discussed and identified patients' prevention, symptoms, and treatment of hyperglycemia.;Taught prevention, symptoms, and  treatment of hypoglycemia - the 15 rule.    Chronic complications Dental care;Retinopathy and reason for yearly dilated eye exams;Nephropathy, what it is, prevention of, the use of ACE, ARB's and early detection of through urine microalbumia.;Lipid levels, blood glucose control and heart disease;Assessed and  discussed foot care and prevention of foot problems      Individualized Goals (developed by patient)   Nutrition General guidelines for healthy choices and portions discussed;Follow meal plan discussed    Physical Activity Exercise 5-7 days per week;15 minutes per day;30 minutes per day    Reducing Risk do foot checks daily;treat hypoglycemia with 15 grams of carbs if blood glucose less than 70mg /dL      Post-Education Assessment   Patient understands the diabetes disease and treatment process. Demonstrates understanding / competency    Patient understands incorporating nutritional management into lifestyle. Demonstrates understanding / competency    Patient undertands incorporating physical activity into lifestyle. Demonstrates understanding / competency    Patient understands using medications safely. Demonstrates understanding / competency    Patient understands monitoring blood glucose, interpreting and using results Demonstrates understanding / competency    Patient understands prevention, detection, and treatment of acute complications. Demonstrates understanding / competency    Patient understands prevention, detection, and treatment of chronic complications. Demonstrates understanding / competency    Patient understands how to develop strategies to address psychosocial issues. Demonstrates understanding / competency    Patient understands how to develop strategies to promote health/change behavior. Demonstrates understanding / competency      Outcomes   Expected Outcomes Demonstrated interest in learning. Expect positive outcomes    Future DMSE PRN    Program Status Completed             Individualized Plan for Diabetes Self-Management Training:   Learning Objective:  Patient will have a greater understanding of diabetes self-management. Patient education plan is to attend individual and/or group sessions per assessed needs and concerns.   Expected Outcomes:  Demonstrated  interest in learning. Expect positive outcomes  Education material provided: ADA - How to Thrive: A Guide for Your Journey with Diabetes, Meal plan card, My Plate, Snack sheet, and Carbohydrate counting sheet; offered materials in [place]   If problems or questions, patient to contact team via:  Phone and Email  Future DSME appointment: PRN

## 2022-10-06 NOTE — Progress Notes (Signed)
No action done

## 2022-10-16 NOTE — Progress Notes (Signed)
Error

## 2022-10-21 ENCOUNTER — Ambulatory Visit (INDEPENDENT_AMBULATORY_CARE_PROVIDER_SITE_OTHER): Payer: Medicare Other | Admitting: Family Medicine

## 2022-10-21 VITALS — BP 132/70 | HR 56 | Temp 98.1°F | Ht 61.0 in | Wt 117.4 lb

## 2022-10-21 DIAGNOSIS — N2581 Secondary hyperparathyroidism of renal origin: Secondary | ICD-10-CM | POA: Diagnosis not present

## 2022-10-21 DIAGNOSIS — N189 Chronic kidney disease, unspecified: Secondary | ICD-10-CM

## 2022-10-21 DIAGNOSIS — E559 Vitamin D deficiency, unspecified: Secondary | ICD-10-CM

## 2022-10-21 DIAGNOSIS — E7849 Other hyperlipidemia: Secondary | ICD-10-CM

## 2022-10-21 DIAGNOSIS — I10 Essential (primary) hypertension: Secondary | ICD-10-CM

## 2022-10-21 DIAGNOSIS — Z7984 Long term (current) use of oral hypoglycemic drugs: Secondary | ICD-10-CM | POA: Diagnosis not present

## 2022-10-21 DIAGNOSIS — N1832 Chronic kidney disease, stage 3b: Secondary | ICD-10-CM | POA: Diagnosis not present

## 2022-10-21 DIAGNOSIS — E1122 Type 2 diabetes mellitus with diabetic chronic kidney disease: Secondary | ICD-10-CM | POA: Diagnosis not present

## 2022-10-21 LAB — BASIC METABOLIC PANEL
BUN: 30 mg/dL — ABNORMAL HIGH (ref 6–23)
CO2: 29 meq/L (ref 19–32)
Calcium: 9.8 mg/dL (ref 8.4–10.5)
Chloride: 101 meq/L (ref 96–112)
Creatinine, Ser: 1.65 mg/dL — ABNORMAL HIGH (ref 0.40–1.20)
GFR: 29.77 mL/min — ABNORMAL LOW (ref >60.00)
Glucose, Bld: 110 mg/dL — ABNORMAL HIGH (ref 70–99)
Potassium: 3.9 meq/L (ref 3.5–5.1)
Sodium: 141 meq/L (ref 135–145)

## 2022-10-21 LAB — URINALYSIS, ROUTINE W REFLEX MICROSCOPIC
Bilirubin Urine: NEGATIVE
Ketones, ur: NEGATIVE
Nitrite: NEGATIVE
Specific Gravity, Urine: <=1.005 — AB (ref 1.000–1.030)
Total Protein, Urine: NEGATIVE
Urine Glucose: NEGATIVE
Urobilinogen, UA: 0.2 (ref 0.0–1.0)
pH: 6 (ref 5.0–8.0)

## 2022-10-21 LAB — HEMOGLOBIN A1C: Hgb A1c MFr Bld: 6.8 % — ABNORMAL HIGH (ref 4.6–6.5)

## 2022-10-21 LAB — VITAMIN D 25 HYDROXY (VIT D DEFICIENCY, FRACTURES): VITD: 63.52 ng/mL (ref 30.00–100.00)

## 2022-10-21 LAB — MICROALBUMIN / CREATININE URINE RATIO
Creatinine,U: 41.1 mg/dL
Microalb Creat Ratio: 12.7 mg/g (ref 0.0–30.0)
Microalb, Ur: 5.2 mg/dL — ABNORMAL HIGH (ref 0.0–1.9)

## 2022-10-21 MED ORDER — METFORMIN HCL 500 MG PO TABS
500.0000 mg | ORAL_TABLET | Freq: Every day | ORAL | 3 refills | Status: DC
Start: 2022-10-21 — End: 2023-01-20

## 2022-10-21 MED ORDER — LOSARTAN POTASSIUM-HCTZ 50-12.5 MG PO TABS
1.0000 | ORAL_TABLET | Freq: Every day | ORAL | 3 refills | Status: DC
Start: 2022-10-21 — End: 2023-01-20

## 2022-10-21 NOTE — Assessment & Plan Note (Signed)
I will recheck her Vitamin D level today.

## 2022-10-21 NOTE — Progress Notes (Signed)
[name] [address] [place] [address] Dept: [phone] Dept Fax: [phone]  Chronic Care Office Visit  Subjective:    Patient ID: Becky Turner, female    DOB: 05/08/1945, 77 y.o..   MRN: [id number]  Chief Complaint  Patient presents with   Hypertension    3 month f/u HTN.  C/o still having dizziness when standing.   History of Present Illness:  Patient is in today for reassessment of chronic medical issues. Interpretation was provided by her daughter, [name]. It is Ms. Nantz's preference to have her daughter fulfill this role. A medical interpreter arrived 20 min. into the appointment, but Ms. Dosanjh declined to have them join late.   Ms. Bian has a history of hypertension. She is managed on Zestoretic (lisinopril/HCTZ) 20-12.5 mg daily. I had decreased her HCTZ dose at her last visit, due to complaints of lightheadedness with standing. She continues to note this as an issue.    Ms. Miska has a history of hyperlipidemia. She is currently on rosuvastatin 40 mg daily.    Ms. Jha has recently been diagnosed with Type 2 diabetes, having had 2 A1c levels > 6.5%.   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 She has evidence of secondary hyperparathyroidism and normocytic anemia. I have referred her to nephrology, but she has not heard back about this appointment.  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   Elevated LFTs    Choledocholithiasis 07/19/2020   Chronic kidney  disease, stage 3b (HCC) 07/19/2020   Corneal edema of right eye 09/22/2019   Hyperlipidemia, familial, high LDL 01/30/2019   Essential hypertension 09/10/2015   Dizziness and giddiness 09/10/2015   Insomnia 09/10/2015   Type 2 diabetes mellitus with renal complication (HCC) 09/10/2015   Disequilibrium 09/10/2015   Past Surgical History:  Procedure Laterality Date   BILIARY DILATION  10/09/2020   Procedure: BILIARY DILATION;  Surgeon: Lemar Lofty., MD;  Location: [facility] ENDOSCOPY;  Service: Gastroenterology;;   BILIARY STENT PLACEMENT  07/20/2020   Procedure: BILIARY STENT PLACEMENT;  Surgeon: Meryl Dare, MD;  Location: [facility] ENDOSCOPY;  Service: Endoscopy;;   BILIARY STENT PLACEMENT  07/23/2020   Procedure: BILIARY STENT PLACEMENT;  Surgeon: Lemar Lofty., MD;  Location: [facility] ENDOSCOPY;  Service: Gastroenterology;;   BIOPSY  07/23/2020   Procedure: BIOPSY;  Surgeon: Lemar Lofty., MD;  Location: [facility] ENDOSCOPY;  Service: Gastroenterology;;   BIOPSY  10/09/2020   Procedure: BIOPSY;  Surgeon: Lemar Lofty., MD;  Location: [facility] ENDOSCOPY;  Service: Gastroenterology;;   CATARACT EXTRACTION, BILATERAL  2019   CHOLECYSTECTOMY N/A 07/24/2020   Procedure: LAPAROSCOPIC CHOLECYSTECTOMY;  Surgeon: Violeta Gelinas, MD;  Location: [facility] OR;  Service: General;  Laterality: N/A;   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   ENDOSCOPIC RETROGRADE CHOLANGIOPANCREATOGRAPHY (ERCP) WITH PROPOFOL N/A 07/23/2020   Procedure: ENDOSCOPIC RETROGRADE CHOLANGIOPANCREATOGRAPHY (ERCP) WITH PROPOFOL;  Surgeon: Lemar Lofty., MD;  Location: [facility] ENDOSCOPY;  Service: Gastroenterology;  Laterality: N/A;   ENDOSCOPIC RETROGRADE CHOLANGIOPANCREATOGRAPHY (ERCP) WITH PROPOFOL N/A 10/09/2020  Procedure: ENDOSCOPIC RETROGRADE CHOLANGIOPANCREATOGRAPHY (ERCP) WITH PROPOFOL;  Surgeon: Meridee Score Netty Starring., MD;  Location: [facility] ENDOSCOPY;  Service: Gastroenterology;  Laterality: N/A;    ERCP N/A 07/20/2020   Procedure: ENDOSCOPIC RETROGRADE CHOLANGIOPANCREATOGRAPHY (ERCP);  Surgeon: Meryl Dare, MD;  Location: [facility] ENDOSCOPY;  Service: Endoscopy;  Laterality: N/A;   HEMOSTASIS CONTROL  07/20/2020   Procedure: HEMOSTASIS CONTROL;  Surgeon: Meryl Dare, MD;  Location: [facility] ENDOSCOPY;  Service: Endoscopy;;   REMOVAL OF STONES  07/20/2020   Procedure: REMOVAL OF STONES;  Surgeon: Meryl Dare, MD;  Location: [facility] ENDOSCOPY;  Service: Endoscopy;;   REMOVAL OF STONES  07/23/2020   Procedure: REMOVAL OF STONES;  Surgeon: Lemar Lofty., MD;  Location: [facility] ENDOSCOPY;  Service: Gastroenterology;;   REMOVAL OF STONES  10/09/2020   Procedure: REMOVAL OF STONES;  Surgeon: Lemar Lofty., MD;  Location: [facility] ENDOSCOPY;  Service: Gastroenterology;;   [name]  07/20/2020   Procedure: [name];  Surgeon: Meryl Dare, MD;  Location: [facility] ENDOSCOPY;  Service: Endoscopy;;   SPYGLASS CHOLANGIOSCOPY N/A 07/23/2020   Procedure: ZOXWRUEA CHOLANGIOSCOPY;  Surgeon: Lemar Lofty., MD;  Location: [facility] ENDOSCOPY;  Service: Gastroenterology;  Laterality: N/A;   SPYGLASS CHOLANGIOSCOPY N/A 10/09/2020   Procedure: SPYGLASS CHOLANGIOSCOPY;  Surgeon: Lemar Lofty., MD;  Location: [facility] ENDOSCOPY;  Service: Gastroenterology;  Laterality: N/A;   SPYGLASS LITHOTRIPSY N/A 07/23/2020   Procedure: [id number] LITHOTRIPSY;  Surgeon: Lemar Lofty., MD;  Location: [facility] ENDOSCOPY;  Service: Gastroenterology;  Laterality: N/A;   STENT REMOVAL  07/23/2020   Procedure: STENT REMOVAL;  Surgeon: Meridee Score Netty Starring., MD;  Location: [facility] ENDOSCOPY;  Service: Gastroenterology;;   [name] REMOVAL  10/09/2020   Procedure: STENT REMOVAL;  Surgeon: Lemar Lofty., MD;  Location: [facility] ENDOSCOPY;  Service: Gastroenterology;;   Family History  Problem Relation Age of Onset   Hypertension Mother    Hypertension Father    Diabetes Sister    Gallstones Other     Hyperlipidemia Daughter    Other Neg Hx    Colon cancer Neg Hx    Esophageal cancer Neg Hx    Inflammatory bowel disease Neg Hx    Liver disease Neg Hx    Pancreatic cancer Neg Hx    Rectal cancer Neg Hx    Stomach cancer Neg Hx    Outpatient Medications Prior to Visit  Medication Sig Dispense Refill   prednisoLONE acetate (PRED FORTE) 1 % ophthalmic suspension Place 1 drop into the right eye daily.     rosuvastatin (CRESTOR) 40 MG tablet Take 1 tablet (40 mg total) by mouth daily. 90 tablet 3   vitamin B-12 (CYANOCOBALAMIN) 1000 MCG tablet Take 1 tablet (1,000 mcg total) by mouth daily. 30 tablet 3   Vitamin D, Ergocalciferol, (DRISDOL) 1.25 MG (50000 UNIT) CAPS capsule Take 1 capsule (50,000 Units total) by mouth every 7 (seven) days. 12 capsule 0   lisinopril-hydrochlorothiazide (ZESTORETIC) 20-12.5 MG tablet Take 1 tablet by mouth daily. 90 tablet 3   No facility-administered medications prior to visit.   Allergies  Allergen Reactions   Aspirin Nausea And Vomiting   Amoxicillin Rash   Clarithromycin Rash   Objective:   Today's Vitals   10/21/22 0942  BP: 132/70  Pulse: (!) 56  Temp: 98.1 F (36.7 C)  TempSrc: Temporal  SpO2: 97%  Weight: 117 lb 6.4 oz (53.3 kg)  Height: 5\' 1"  (1.549 m)   Body mass index is 22.18 kg/m.   Orthostatics      BP  P Lying:  154/78  60 Sitting:  142/70  57 Standing: 134/68  66  General: Well developed, well nourished. No acute distress. Feet- Skin intact. No sign of maceration between toes. Nails are normal. Dorsalis pedis and posterior   tibial artery pulses are normal. 5.07 monofilament testing normal. Psych: Alert and oriented. Normal mood and affect.  Health Maintenance Due  Topic Date Due   OPHTHALMOLOGY EXAM  Never done   Diabetic kidney evaluation - Urine ACR  Never done   DTaP/Tdap/Td (1 - Tdap) Never done   Zoster Vaccines- Shingrix (1 of 2) Never done   Lab Results Last hemoglobin A1c Lab Results  Component Value  Date   HGBA1C 6.8 (H) 07/21/2022   Component Ref Range & Units 3 mo ago  PTH 16 - 77 pg/mL 88 High         Latest Ref Rng & Units 07/21/2022    9:48 AM 01/20/2022    8:59 AM 04/04/2021    3:04 PM  BMP  Glucose 70 - 99 mg/dL 301  601  98   BUN 6 - 23 mg/dL 38  32  44   Creatinine 0.40 - 1.20 mg/dL 0.93  2.35  5.73   Sodium 135 - 145 mEq/L 138  141  140   Potassium 3.5 - 5.1 mEq/L 3.7  4.2  4.0   Chloride 96 - 112 mEq/L 99  103  103   CO2 19 - 32 mEq/L 27  30  30    Calcium 8.4 - 10.5 mg/dL 9.3  9.5  9.9    Component Ref Range & Units 3 mo ago  Phosphorus 2.3 - 4.6 mg/dL 3.6   Component Ref Range & Units 3 mo ago  VITD 30.00 - 100.00 ng/mL 13.28 Low    Assessment & Plan:   Problem List Items Addressed This Visit       Cardiovascular and Mediastinum   Essential hypertension - Primary    Blood pressure is in adequate control, but Ms. Alldredge is having some orthostatic symptoms. Her pressures and pulse int he office show a tendency towards orthostasis, but does not exhibit actual hypotension. Her daughter asks about her being on an ARB rather than the ACE-I. We will try to make that change and see how she does.      Relevant Medications   losartan-hydrochlorothiazide (HYZAAR) 50-12.5 MG tablet     Endocrine   Secondary hyperparathyroidism of renal origin [facility])    Referred to nephrology. Daughter will calla gain about scheduling the appointment.      Type 2 diabetes mellitus with renal complication (HCC)    Lab results have confirmed mild Type 2 diabetes. Ms. Pouliot did go for diabetes teaching. I will start her on metformin 500 mg daily. I reviewed recommended screenings, including an annual diabetes retinopathy exam with her eye doctor.      Relevant Medications   metFORMIN (GLUCOPHAGE) 500 MG tablet   losartan-hydrochlorothiazide (HYZAAR) 50-12.5 MG tablet   Other Relevant Orders   Microalbumin / creatinine urine ratio   Basic metabolic panel   Hemoglobin A1c    Urinalysis, Routine w reflex microscopic     Genitourinary   Chronic kidney disease, stage 3b (HCC)    Referred to nephrology. Appointment has not been set up.      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      Relevant Orders   VITAMIN D 25  Hydroxy (Vit-D Deficiency, Fractures)     Other   Hyperlipidemia, familial, high LDL    Continue rosuvastatin 40 mg daily.      Relevant Medications   losartan-hydrochlorothiazide (HYZAAR) 50-12.5 MG tablet   Other Visit Diagnoses     Long term current use of oral hypoglycemic drug           Return in about 3 months (around 01/20/2023) for Reassessment.   Loyola Mast, MD

## 2022-10-21 NOTE — Assessment & Plan Note (Signed)
Blood pressure is in adequate control, but Becky Turner is having some orthostatic symptoms. Her pressures and pulse int he office show a tendency towards orthostasis, but does not exhibit actual hypotension. Her daughter asks about her being on an ARB rather than the ACE-I. We will try to make that change and see how she does.

## 2022-10-21 NOTE — Assessment & Plan Note (Signed)
Referred to nephrology. Daughter will calla gain about scheduling the appointment.

## 2022-10-21 NOTE — Assessment & Plan Note (Signed)
Referred to nephrology. Appointment has not been set up.

## 2022-10-21 NOTE — Assessment & Plan Note (Signed)
Continue rosuvastatin 40mg daily  

## 2022-10-21 NOTE — Assessment & Plan Note (Signed)
Lab results have confirmed mild Type 2 diabetes. Becky Turner did go for diabetes teaching. I will start her on metformin 500 mg daily. I reviewed recommended screenings, including an annual diabetes retinopathy exam with her eye doctor.

## 2022-11-13 ENCOUNTER — Other Ambulatory Visit: Payer: Self-pay | Admitting: Nephrology

## 2022-11-13 DIAGNOSIS — N1832 Chronic kidney disease, stage 3b: Secondary | ICD-10-CM

## 2022-11-13 DIAGNOSIS — I129 Hypertensive chronic kidney disease with stage 1 through stage 4 chronic kidney disease, or unspecified chronic kidney disease: Secondary | ICD-10-CM | POA: Diagnosis not present

## 2022-11-18 ENCOUNTER — Inpatient Hospital Stay
Admission: RE | Admit: 2022-11-18 | Discharge: 2022-11-18 | Discharge status: Home / Self Care | Payer: Medicare Other | Source: Ambulatory Visit | Attending: Nephrology | Admitting: Nephrology

## 2022-11-18 DIAGNOSIS — N1832 Chronic kidney disease, stage 3b: Secondary | ICD-10-CM

## 2022-11-18 DIAGNOSIS — N183 Chronic kidney disease, stage 3 unspecified: Secondary | ICD-10-CM | POA: Diagnosis not present

## 2022-11-29 DIAGNOSIS — Z23 Encounter for immunization: Secondary | ICD-10-CM | POA: Diagnosis not present

## 2023-01-20 ENCOUNTER — Ambulatory Visit: Payer: Medicare Other | Admitting: Family Medicine

## 2023-01-20 ENCOUNTER — Encounter: Payer: Self-pay | Admitting: Family Medicine

## 2023-01-20 VITALS — BP 126/68 | HR 61 | Temp 97.9°F | Ht 61.0 in | Wt 116.2 lb

## 2023-01-20 DIAGNOSIS — E7849 Other hyperlipidemia: Secondary | ICD-10-CM

## 2023-01-20 DIAGNOSIS — E1122 Type 2 diabetes mellitus with diabetic chronic kidney disease: Secondary | ICD-10-CM | POA: Diagnosis not present

## 2023-01-20 DIAGNOSIS — N184 Chronic kidney disease, stage 4 (severe): Secondary | ICD-10-CM | POA: Diagnosis not present

## 2023-01-20 DIAGNOSIS — I10 Essential (primary) hypertension: Secondary | ICD-10-CM

## 2023-01-20 DIAGNOSIS — Z7984 Long term (current) use of oral hypoglycemic drugs: Secondary | ICD-10-CM

## 2023-01-20 LAB — HEMOGLOBIN A1C: Hgb A1c MFr Bld: 6.2 % (ref 4.6–6.5)

## 2023-01-20 LAB — GLUCOSE, RANDOM: Glucose, Bld: 112 mg/dL — ABNORMAL HIGH (ref 70–99)

## 2023-01-20 MED ORDER — ROSUVASTATIN CALCIUM 40 MG PO TABS
40.0000 mg | ORAL_TABLET | Freq: Every day | ORAL | 3 refills | Status: DC
Start: 2023-01-20 — End: 2023-10-29

## 2023-01-20 MED ORDER — LOSARTAN POTASSIUM 50 MG PO TABS
50.0000 mg | ORAL_TABLET | Freq: Every day | ORAL | 3 refills | Status: DC
Start: 2023-01-20 — End: 2023-02-15

## 2023-01-20 MED ORDER — EMPAGLIFLOZIN 10 MG PO TABS
10.0000 mg | ORAL_TABLET | Freq: Every day | ORAL | 3 refills | Status: DC
Start: 2023-01-20 — End: 2023-04-13

## 2023-01-20 NOTE — Progress Notes (Signed)
[name] [address] [place] [address] Dept: [phone] Dept Fax: [phone]  Chronic Care Office Visit  Subjective:    Patient ID: Carlisle Timothy, female    DOB: 05/18/1945, 77 y.o..   MRN: [id number]  Chief Complaint  Patient presents with   Hypertension   History of Present Illness:  Patient is in today for reassessment of chronic medical issues. Interpretation was provided by her daughter, [name]. It is Ms. Vise's preference to have her daughter fulfill this role.   Ms. Pedley has a history of hypertension. She is managed on losartan-HCTZ (Hyzaar) 50-12.5 mg daily. We had switched to this at her last appointment to try and reduce orthostatic symptoms. She continues to have episodic lightheadedness.   Ms. Messano has a history of hyperlipidemia. She is currently on rosuvastatin 40 mg daily.    Ms. Wiltgen has Type 2 diabetes. She has been on metformin 500 mg daily.   Ms. Barger has a stage 4 CKD. She has evidence of secondary hyperparathyroidism and normocytic anemia. She has seen Dr. Marisue Humble (nephrology) since her last visit.  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   Elevated LFTs    Choledocholithiasis 07/19/2020   Chronic kidney disease, stage 4 (severe) (HCC) 07/19/2020   Corneal edema of right eye 09/22/2019   Hyperlipidemia, familial, high LDL 01/30/2019   Essential hypertension 09/10/2015   Dizziness and giddiness 09/10/2015   Insomnia 09/10/2015    Type 2 diabetes mellitus with renal complication (HCC) 09/10/2015   Disequilibrium 09/10/2015   Past Surgical History:  Procedure Laterality Date   BILIARY DILATION  10/09/2020   Procedure: BILIARY DILATION;  Surgeon: Lemar Lofty., MD;  Location: [facility] ENDOSCOPY;  Service: Gastroenterology;;   BILIARY STENT PLACEMENT  07/20/2020   Procedure: BILIARY STENT PLACEMENT;  Surgeon: Meryl Dare, MD;  Location: [facility] ENDOSCOPY;  Service: Endoscopy;;   BILIARY STENT PLACEMENT  07/23/2020   Procedure: BILIARY STENT PLACEMENT;  Surgeon: Lemar Lofty., MD;  Location: [facility] ENDOSCOPY;  Service: Gastroenterology;;   BIOPSY  07/23/2020   Procedure: BIOPSY;  Surgeon: Lemar Lofty., MD;  Location: [facility] ENDOSCOPY;  Service: Gastroenterology;;   BIOPSY  10/09/2020   Procedure: BIOPSY;  Surgeon: Lemar Lofty., MD;  Location: [facility] ENDOSCOPY;  Service: Gastroenterology;;   CATARACT EXTRACTION, BILATERAL  2019   CHOLECYSTECTOMY N/A 07/24/2020   Procedure: LAPAROSCOPIC CHOLECYSTECTOMY;  Surgeon: Violeta Gelinas, MD;  Location: [facility] OR;  Service: General;  Laterality: N/A;   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   ENDOSCOPIC RETROGRADE CHOLANGIOPANCREATOGRAPHY (ERCP) WITH PROPOFOL N/A 07/23/2020   Procedure: ENDOSCOPIC RETROGRADE CHOLANGIOPANCREATOGRAPHY (ERCP) WITH PROPOFOL;  Surgeon: Lemar Lofty., MD;  Location: [facility] ENDOSCOPY;  Service: Gastroenterology;  Laterality: N/A;   ENDOSCOPIC RETROGRADE CHOLANGIOPANCREATOGRAPHY (ERCP) WITH PROPOFOL N/A 10/09/2020   Procedure: ENDOSCOPIC RETROGRADE CHOLANGIOPANCREATOGRAPHY (ERCP) WITH PROPOFOL;  Surgeon: Meridee Score Netty Starring., MD;  Location: [facility] ENDOSCOPY;  Service: Gastroenterology;  Laterality: N/A;   ERCP N/A 07/20/2020   Procedure: ENDOSCOPIC RETROGRADE CHOLANGIOPANCREATOGRAPHY (ERCP);  Surgeon: Meryl Dare, MD;  Location:  [facility] ENDOSCOPY;  Service: Endoscopy;  Laterality: N/A;   HEMOSTASIS CONTROL  07/20/2020    Procedure: HEMOSTASIS CONTROL;  Surgeon: Meryl Dare, MD;  Location: [facility] ENDOSCOPY;  Service: Endoscopy;;   REMOVAL OF STONES  07/20/2020   Procedure: REMOVAL OF STONES;  Surgeon: Meryl Dare, MD;  Location: [facility] ENDOSCOPY;  Service: Endoscopy;;   REMOVAL OF STONES  07/23/2020   Procedure: REMOVAL OF STONES;  Surgeon: Lemar Lofty., MD;  Location: [facility] ENDOSCOPY;  Service: Gastroenterology;;   REMOVAL OF STONES  10/09/2020   Procedure: REMOVAL OF STONES;  Surgeon: Lemar Lofty., MD;  Location: [facility] ENDOSCOPY;  Service: Gastroenterology;;   [name]  07/20/2020   Procedure: [name];  Surgeon: Meryl Dare, MD;  Location: [facility] ENDOSCOPY;  Service: Endoscopy;;   SPYGLASS CHOLANGIOSCOPY N/A 07/23/2020   Procedure: PPIRJJOA CHOLANGIOSCOPY;  Surgeon: Lemar Lofty., MD;  Location: [facility] ENDOSCOPY;  Service: Gastroenterology;  Laterality: N/A;   SPYGLASS CHOLANGIOSCOPY N/A 10/09/2020   Procedure: SPYGLASS CHOLANGIOSCOPY;  Surgeon: Lemar Lofty., MD;  Location: [facility] ENDOSCOPY;  Service: Gastroenterology;  Laterality: N/A;   SPYGLASS LITHOTRIPSY N/A 07/23/2020   Procedure: CZYSAYTK LITHOTRIPSY;  Surgeon: Lemar Lofty., MD;  Location: [facility] ENDOSCOPY;  Service: Gastroenterology;  Laterality: N/A;   STENT REMOVAL  07/23/2020   Procedure: STENT REMOVAL;  Surgeon: Meridee Score Netty Starring., MD;  Location: [facility] ENDOSCOPY;  Service: Gastroenterology;;   [name] REMOVAL  10/09/2020   Procedure: STENT REMOVAL;  Surgeon: Lemar Lofty., MD;  Location: [facility] ENDOSCOPY;  Service: Gastroenterology;;   Family History  Problem Relation Age of Onset   Hypertension Mother    Hypertension Father    Diabetes Sister    Gallstones Other    Hyperlipidemia Daughter    Other Neg Hx    Colon cancer Neg Hx    Esophageal cancer Neg Hx    Inflammatory bowel disease Neg Hx    Liver disease Neg Hx    Pancreatic cancer Neg Hx    Rectal cancer Neg Hx    Stomach  cancer Neg Hx    Outpatient Medications Prior to Visit  Medication Sig Dispense Refill   Cholecalciferol (VITAMIN D3) 50 MCG (2000 UT) CAPS Take 2,000 Units by mouth daily.     prednisoLONE acetate (PRED FORTE) 1 % ophthalmic suspension Place 1 drop into the right eye daily.     vitamin B-12 (CYANOCOBALAMIN) 1000 MCG tablet Take 1 tablet (1,000 mcg total) by mouth daily. 30 tablet 3   losartan-hydrochlorothiazide (HYZAAR) 50-12.5 MG tablet Take 1 tablet by mouth daily. 90 tablet 3   metFORMIN (GLUCOPHAGE) 500 MG tablet Take 1 tablet (500 mg total) by mouth daily with breakfast. 90 tablet 3   rosuvastatin (CRESTOR) 40 MG tablet Take 1 tablet (40 mg total) by mouth daily. 90 tablet 3   Vitamin D, Ergocalciferol, (DRISDOL) 1.25 MG (50000 UNIT) CAPS capsule Take 1 capsule (50,000 Units total) by mouth every 7 (seven) days. 12 capsule 0   No facility-administered medications prior to visit.   Allergies  Allergen Reactions   Aspirin Nausea And Vomiting   Amoxicillin Rash   Clarithromycin Rash   Objective:   Today's Vitals   01/20/23 1012  BP: 126/68  Pulse: 61  Temp: 97.9 F (36.6 C)  TempSrc: Temporal  SpO2: 99%  Weight: 116 lb 3.2 oz (52.7 kg)  Height: 5\' 1"  (1.549 m)   Body mass index is 21.96 kg/m.   General: Well developed, well nourished. No acute distress. Psych: Alert and oriented.  Normal mood and affect.  Health Maintenance Due  Topic Date Due   OPHTHALMOLOGY EXAM  Never done   DTaP/Tdap/Td (1 - Tdap) Never done   Zoster Vaccines- Shingrix (1 of 2) Never done   Medicare Annual Wellness (AWV)  03/14/2023     Assessment & Plan:   Problem List Items Addressed This Visit       Cardiovascular and Mediastinum   Essential hypertension - Primary    Blood pressure is in adequate control, but Ms. Mesina is having ongoing orthostatic symptoms. I recommend we switch her from Hyzaar to losartan monotherapy.      Relevant Medications   rosuvastatin (CRESTOR) 40 MG tablet    losartan (COZAAR) 50 MG tablet     Endocrine   Type 2 diabetes mellitus with renal complication (HCC)    In light of her decreased GFR, I will switch her to an SGLT2 agent. I will check her A1c today.      Relevant Medications   rosuvastatin (CRESTOR) 40 MG tablet   losartan (COZAAR) 50 MG tablet   empagliflozin (JARDIANCE) 10 MG TABS tablet   Other Relevant Orders   Glucose, random   Hemoglobin A1c     Genitourinary   Chronic kidney disease, stage 4 (severe) (HCC)    Continue focus on blood pressure and glucose control, adequate hydration, and avoidance of nephrotoxic medications.        Other   Hyperlipidemia, familial, high LDL    Lipids at goal. Continue rosuvastatin 40 mg daily.      Relevant Medications   rosuvastatin (CRESTOR) 40 MG tablet   losartan (COZAAR) 50 MG tablet    Return in about 3 months (around 04/20/2023) for Reassessment.   Loyola Mast, MD

## 2023-01-20 NOTE — Assessment & Plan Note (Signed)
In light of her decreased GFR, I will switch her to an SGLT2 agent. I will check her A1c today.

## 2023-01-20 NOTE — Assessment & Plan Note (Signed)
Continue focus on blood pressure and glucose control, adequate hydration, and avoidance of nephrotoxic medications.

## 2023-01-20 NOTE — Assessment & Plan Note (Signed)
Blood pressure is in adequate control, but Becky Turner is having ongoing orthostatic symptoms. I recommend we switch her from Hyzaar to losartan monotherapy.

## 2023-01-20 NOTE — Assessment & Plan Note (Signed)
Lipids at goal. Continue rosuvastatin 40 mg daily.  

## 2023-01-27 DIAGNOSIS — H43313 Vitreous membranes and strands, bilateral: Secondary | ICD-10-CM | POA: Diagnosis not present

## 2023-01-27 DIAGNOSIS — Z947 Corneal transplant status: Secondary | ICD-10-CM | POA: Diagnosis not present

## 2023-01-27 DIAGNOSIS — Z961 Presence of intraocular lens: Secondary | ICD-10-CM | POA: Diagnosis not present

## 2023-01-27 DIAGNOSIS — H35369 Drusen (degenerative) of macula, unspecified eye: Secondary | ICD-10-CM | POA: Diagnosis not present

## 2023-01-27 DIAGNOSIS — Z9889 Other specified postprocedural states: Secondary | ICD-10-CM | POA: Diagnosis not present

## 2023-01-27 LAB — HM DIABETES EYE EXAM

## 2023-02-02 DIAGNOSIS — N1832 Chronic kidney disease, stage 3b: Secondary | ICD-10-CM | POA: Diagnosis not present

## 2023-02-12 ENCOUNTER — Other Ambulatory Visit: Payer: Self-pay | Admitting: Nephrology

## 2023-02-12 DIAGNOSIS — N1832 Chronic kidney disease, stage 3b: Secondary | ICD-10-CM | POA: Diagnosis not present

## 2023-02-12 DIAGNOSIS — I129 Hypertensive chronic kidney disease with stage 1 through stage 4 chronic kidney disease, or unspecified chronic kidney disease: Secondary | ICD-10-CM | POA: Diagnosis not present

## 2023-02-12 DIAGNOSIS — E1122 Type 2 diabetes mellitus with diabetic chronic kidney disease: Secondary | ICD-10-CM | POA: Diagnosis not present

## 2023-02-12 DIAGNOSIS — N289 Disorder of kidney and ureter, unspecified: Secondary | ICD-10-CM | POA: Diagnosis not present

## 2023-02-12 DIAGNOSIS — E119 Type 2 diabetes mellitus without complications: Secondary | ICD-10-CM | POA: Diagnosis not present

## 2023-02-15 ENCOUNTER — Encounter: Payer: Self-pay | Admitting: Family Medicine

## 2023-02-15 DIAGNOSIS — I10 Essential (primary) hypertension: Secondary | ICD-10-CM

## 2023-02-15 MED ORDER — LOSARTAN POTASSIUM 100 MG PO TABS
100.0000 mg | ORAL_TABLET | Freq: Every day | ORAL | 3 refills | Status: DC
Start: 2023-02-15 — End: 2023-12-06

## 2023-02-16 ENCOUNTER — Ambulatory Visit: Payer: Medicare Other | Admitting: Family Medicine

## 2023-02-20 ENCOUNTER — Other Ambulatory Visit: Payer: Medicare Other

## 2023-02-27 ENCOUNTER — Ambulatory Visit
Admission: RE | Admit: 2023-02-27 | Discharge: 2023-02-27 | Disposition: A | Discharge status: Home / Self Care | Payer: Medicare Other | Source: Ambulatory Visit | Attending: Nephrology | Admitting: Nephrology

## 2023-02-27 DIAGNOSIS — N289 Disorder of kidney and ureter, unspecified: Secondary | ICD-10-CM

## 2023-02-27 DIAGNOSIS — N281 Cyst of kidney, acquired: Secondary | ICD-10-CM | POA: Diagnosis not present

## 2023-02-27 DIAGNOSIS — I700 Atherosclerosis of aorta: Secondary | ICD-10-CM | POA: Diagnosis not present

## 2023-02-27 MED ORDER — GADOPICLENOL 0.5 MMOL/ML IV SOLN
5.0000 mL | Freq: Once | INTRAVENOUS | Status: AC | PRN
  Administered 2023-02-27: 5 mL via INTRAVENOUS

## 2023-03-02 ENCOUNTER — Ambulatory Visit (INDEPENDENT_AMBULATORY_CARE_PROVIDER_SITE_OTHER): Payer: Medicare Other | Admitting: Family Medicine

## 2023-03-02 ENCOUNTER — Encounter: Payer: Self-pay | Admitting: Family Medicine

## 2023-03-02 VITALS — BP 142/70 | HR 64 | Temp 98.0°F | Ht 61.0 in | Wt 117.6 lb

## 2023-03-02 DIAGNOSIS — I10 Essential (primary) hypertension: Secondary | ICD-10-CM | POA: Diagnosis not present

## 2023-03-02 MED ORDER — AMLODIPINE BESYLATE 5 MG PO TABS
5.0000 mg | ORAL_TABLET | Freq: Every day | ORAL | 3 refills | Status: DC
Start: 2023-03-02 — End: 2023-12-31

## 2023-03-02 NOTE — Progress Notes (Signed)
[name] [address] [place] [address] Dept: [phone] Dept Fax: [phone]  Chronic Care Office Visit  Subjective:    Patient ID: Becky Turner, female    DOB: [birth year], 78 y.o..   MRN: [id number]  Chief Complaint  Patient presents with   Follow-up    Fu elevated BP   History of Present Illness:  Patient is in today for reassessment of chronic medical issues.  Interpretation was provided by her daughter, Camelia Turner. It is Ms. Becky Turner's preference to have her daughter fulfill this role.   [name] has a history of hypertension. She was managed on losartan-HCTZ (Hyzaar) 50-12.5 mg daily, but had complained of orthostatic symptoms. Despite a switch to losartan, she has continued to have episodic lightheadedness. Since reducing her meds, her BP has been running higher and is above the goals nephrology has recommended. Her 7-day average at home has been 152/60.  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   Elevated LFTs    Choledocholithiasis 07/19/2020   Chronic kidney disease, stage 4 (severe) (HCC) 07/19/2020   Corneal edema of right eye 09/22/2019   Hyperlipidemia, familial, high LDL 01/30/2019   Essential hypertension 09/10/2015   Dizziness and giddiness 09/10/2015   Insomnia 09/10/2015   Type 2 diabetes mellitus with renal complication (HCC) 09/10/2015   Disequilibrium 09/10/2015   Past Surgical History:  Procedure Laterality Date   BILIARY DILATION   10/09/2020   Procedure: BILIARY DILATION;  Surgeon: Lemar Lofty., MD;  Location: [facility] ENDOSCOPY;  Service: Gastroenterology;;   BILIARY STENT PLACEMENT  07/20/2020   Procedure: BILIARY STENT PLACEMENT;  Surgeon: Meryl Dare, MD;  Location: [facility] ENDOSCOPY;  Service: Endoscopy;;   BILIARY STENT PLACEMENT  07/23/2020   Procedure: BILIARY STENT PLACEMENT;  Surgeon: Lemar Lofty., MD;  Location: [facility] ENDOSCOPY;  Service: Gastroenterology;;   BIOPSY  07/23/2020   Procedure: BIOPSY;  Surgeon: Lemar Lofty., MD;  Location: [facility] ENDOSCOPY;  Service: Gastroenterology;;   BIOPSY  10/09/2020   Procedure: BIOPSY;  Surgeon: Lemar Lofty., MD;  Location: [facility] ENDOSCOPY;  Service: Gastroenterology;;   CATARACT EXTRACTION, BILATERAL  2019   CHOLECYSTECTOMY N/A 07/24/2020   Procedure: LAPAROSCOPIC CHOLECYSTECTOMY;  Surgeon: Violeta Gelinas, MD;  Location: [facility] OR;  Service: General;  Laterality: N/A;   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   ENDOSCOPIC RETROGRADE CHOLANGIOPANCREATOGRAPHY (ERCP) WITH PROPOFOL N/A 07/23/2020   Procedure: ENDOSCOPIC RETROGRADE CHOLANGIOPANCREATOGRAPHY (ERCP) WITH PROPOFOL;  Surgeon: Lemar Lofty., MD;  Location: [facility] ENDOSCOPY;  Service: Gastroenterology;  Laterality: N/A;   ENDOSCOPIC RETROGRADE CHOLANGIOPANCREATOGRAPHY (ERCP) WITH PROPOFOL N/A 10/09/2020   Procedure: ENDOSCOPIC RETROGRADE CHOLANGIOPANCREATOGRAPHY (ERCP) WITH PROPOFOL;  Surgeon: Meridee Score Netty Starring., MD;  Location: [facility] ENDOSCOPY;  Service: Gastroenterology;  Laterality: N/A;   ERCP N/A 07/20/2020   Procedure: ENDOSCOPIC RETROGRADE CHOLANGIOPANCREATOGRAPHY (ERCP);  Surgeon: Meryl Dare, MD;  Location: [facility] ENDOSCOPY;  Service: Endoscopy;  Laterality: N/A;   HEMOSTASIS CONTROL  07/20/2020   Procedure: HEMOSTASIS CONTROL;  Surgeon: Meryl Dare, MD;  Location: [facility] ENDOSCOPY;  Service: Endoscopy;;  REMOVAL OF STONES  07/20/2020   Procedure: REMOVAL OF STONES;   Surgeon: Meryl Dare, MD;  Location: [facility] ENDOSCOPY;  Service: Endoscopy;;   REMOVAL OF STONES  07/23/2020   Procedure: REMOVAL OF STONES;  Surgeon: Lemar Lofty., MD;  Location: [facility] ENDOSCOPY;  Service: Gastroenterology;;   REMOVAL OF STONES  10/09/2020   Procedure: REMOVAL OF STONES;  Surgeon: Lemar Lofty., MD;  Location: [facility] ENDOSCOPY;  Service: Gastroenterology;;   [name]  07/20/2020   Procedure: [name];  Surgeon: Meryl Dare, MD;  Location: [facility] ENDOSCOPY;  Service: Endoscopy;;   SPYGLASS CHOLANGIOSCOPY N/A 07/23/2020   Procedure: HQIONGEX CHOLANGIOSCOPY;  Surgeon: Lemar Lofty., MD;  Location: [facility] ENDOSCOPY;  Service: Gastroenterology;  Laterality: N/A;   SPYGLASS CHOLANGIOSCOPY N/A 10/09/2020   Procedure: SPYGLASS CHOLANGIOSCOPY;  Surgeon: Lemar Lofty., MD;  Location: [facility] ENDOSCOPY;  Service: Gastroenterology;  Laterality: N/A;   SPYGLASS LITHOTRIPSY N/A 07/23/2020   Procedure: BMWUXLKG LITHOTRIPSY;  Surgeon: Lemar Lofty., MD;  Location: [facility] ENDOSCOPY;  Service: Gastroenterology;  Laterality: N/A;   STENT REMOVAL  07/23/2020   Procedure: STENT REMOVAL;  Surgeon: Meridee Score Netty Starring., MD;  Location: [facility] ENDOSCOPY;  Service: Gastroenterology;;   [name] REMOVAL  10/09/2020   Procedure: STENT REMOVAL;  Surgeon: Lemar Lofty., MD;  Location: [facility] ENDOSCOPY;  Service: Gastroenterology;;   Family History  Problem Relation Age of Onset   Hypertension Mother    Hypertension Father    Diabetes Sister    Gallstones Other    Hyperlipidemia Daughter    Other Neg Hx    Colon cancer Neg Hx    Esophageal cancer Neg Hx    Inflammatory bowel disease Neg Hx    Liver disease Neg Hx    Pancreatic cancer Neg Hx    Rectal cancer Neg Hx    Stomach cancer Neg Hx    Outpatient Medications Prior to Visit  Medication Sig Dispense Refill   Cholecalciferol (VITAMIN D3) 50 MCG (2000 UT) CAPS Take 2,000 Units by mouth daily.      empagliflozin (JARDIANCE) 10 MG TABS tablet Take 1 tablet (10 mg total) by mouth daily before breakfast. 90 tablet 3   losartan (COZAAR) 100 MG tablet Take 1 tablet (100 mg total) by mouth daily. 90 tablet 3   prednisoLONE acetate (PRED FORTE) 1 % ophthalmic suspension Place 1 drop into the right eye daily.     rosuvastatin (CRESTOR) 40 MG tablet Take 1 tablet (40 mg total) by mouth daily. 90 tablet 3   vitamin B-12 (CYANOCOBALAMIN) 1000 MCG tablet Take 1 tablet (1,000 mcg total) by mouth daily. 30 tablet 3   No facility-administered medications prior to visit.   Allergies  Allergen Reactions   Aspirin Nausea And Vomiting   Amoxicillin Rash   Clarithromycin Rash   Objective:   Today's Vitals   03/02/23 1308  BP: (!) 142/70  Pulse: 64  Temp: 98 F (36.7 C)  TempSrc: Temporal  SpO2: 100%  Weight: 117 lb 9.6 oz (53.3 kg)  Height: 5\' 1"  (1.549 m)   Body mass index is 22.22 kg/m.   Orthostatics      BP   P Lying:  142/62  56 Sitting:  140/60  61 Standing: 140/60  60  General: Well developed, well nourished. No acute distress. Psych: Alert and oriented. Normal mood and affect.  Health Maintenance Due  Topic Date Due   OPHTHALMOLOGY EXAM  Never done   DTaP/Tdap/Td (1 - Tdap) Never done   Zoster Vaccines-  Shingrix (1 of 2) Never done   Medicare Annual Wellness (AWV)  03/14/2023      Assessment & Plan:   Problem List Items Addressed This Visit       Cardiovascular and Mediastinum   Essential hypertension - Primary   Ms. Cobaugh is primarily having systolic hypertension. Her therapy has been limited by complaints of orthostasis, though we are not seeing any drop in her BP today. I will try her on a combination of losartan 100 mg daily with amlodipine 5 mg daily and see if she tolerates this.      Relevant Medications   amLODipine (NORVASC) 5 MG tablet    Return in about 6 weeks (around 04/13/2023) for Reassessment.   Loyola Mast, MD

## 2023-03-02 NOTE — Assessment & Plan Note (Signed)
Ms. Rodes is primarily having systolic hypertension. Her therapy has been limited by complaints of orthostasis, though we are not seeing any drop in her BP today. I will try her on a combination of losartan 100 mg daily with amlodipine 5 mg daily and see if she tolerates this.

## 2023-03-23 ENCOUNTER — Other Ambulatory Visit: Payer: Self-pay | Admitting: Family Medicine

## 2023-03-23 DIAGNOSIS — I10 Essential (primary) hypertension: Secondary | ICD-10-CM

## 2023-04-13 ENCOUNTER — Ambulatory Visit: Payer: Medicare Other | Admitting: Family Medicine

## 2023-04-13 VITALS — BP 126/60 | HR 78 | Temp 98.0°F | Ht 61.0 in | Wt 119.0 lb

## 2023-04-13 DIAGNOSIS — Z7984 Long term (current) use of oral hypoglycemic drugs: Secondary | ICD-10-CM

## 2023-04-13 DIAGNOSIS — N184 Chronic kidney disease, stage 4 (severe): Secondary | ICD-10-CM | POA: Diagnosis not present

## 2023-04-13 DIAGNOSIS — E1122 Type 2 diabetes mellitus with diabetic chronic kidney disease: Secondary | ICD-10-CM

## 2023-04-13 DIAGNOSIS — E7849 Other hyperlipidemia: Secondary | ICD-10-CM | POA: Diagnosis not present

## 2023-04-13 DIAGNOSIS — I10 Essential (primary) hypertension: Secondary | ICD-10-CM

## 2023-04-13 MED ORDER — EMPAGLIFLOZIN 10 MG PO TABS: 10.0000 mg | ORAL_TABLET | Freq: Every day | ORAL | 3 refills | Status: AC

## 2023-04-13 NOTE — Assessment & Plan Note (Signed)
 Comparing Ms. Becky Turner's home BP cuff measurement to a manual BP measurement in the office, her home cuff is reading about 10 mmHg higher on the systolic. Her BP in the clinic today is at goal. I will continue her on amlodipine 5 mg daily and losartan 10 mg daily.

## 2023-04-13 NOTE — Progress Notes (Signed)
 [name] [address] [place] [address] Dept: [phone] Dept Fax: [phone]  Chronic Care Office Visit  Subjective:    Patient ID: Becky Turner, female    DOB: May 10, 1945, 78 y.o..   MRN: [id number]  Chief Complaint  Patient presents with   Hypertension    6 week f/u .  BP with machine 135/54.    History of Present Illness:  Patient is in today for reassessment of chronic medical issues. Interpretation was provided by her daughter, Becky Turner. It is [name]'s preference to have her daughter fulfill this role.   [name] has a history of hypertension. She was managed on losartan-HCTZ (Hyzaar) 50-12.5 mg daily, but had complained of orthostatic symptoms. Despite a switch to losartan, she continued to have episodic lightheadedness. Since reducing her meds, her BP had been running higher and is above the goals nephrology has recommended. Her 7-day average at home has gone from 152/60 at her last visit to 136/66. [name] still complains of episodes of lightheadedness with standing up, but this is not every time. At her last visit, her orthostatics were normal.  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   Elevated LFTs    Choledocholithiasis 07/19/2020   Chronic kidney disease, stage 4 (severe) (HCC) 07/19/2020   Corneal edema of right eye 09/22/2019   Hyperlipidemia, familial, high LDL 01/30/2019   Essential hypertension 09/10/2015   Dizziness and giddiness  09/10/2015   Insomnia 09/10/2015   Type 2 diabetes mellitus with renal complication (HCC) 09/10/2015   Disequilibrium 09/10/2015   Past Surgical History:  Procedure Laterality Date   BILIARY DILATION  10/09/2020   Procedure: BILIARY DILATION;  Surgeon: Lemar Lofty., MD;  Location: [facility] ENDOSCOPY;  Service: Gastroenterology;;   BILIARY STENT PLACEMENT  07/20/2020   Procedure: BILIARY STENT PLACEMENT;  Surgeon: Meryl Dare, MD;  Location: [facility] ENDOSCOPY;  Service: Endoscopy;;   BILIARY STENT PLACEMENT  07/23/2020   Procedure: BILIARY STENT PLACEMENT;  Surgeon: Lemar Lofty., MD;  Location: [facility] ENDOSCOPY;  Service: Gastroenterology;;   BIOPSY  07/23/2020   Procedure: BIOPSY;  Surgeon: Lemar Lofty., MD;  Location: [facility] ENDOSCOPY;  Service: Gastroenterology;;   BIOPSY  10/09/2020   Procedure: BIOPSY;  Surgeon: Lemar Lofty., MD;  Location: [facility] ENDOSCOPY;  Service: Gastroenterology;;   CATARACT EXTRACTION, BILATERAL  2019   CHOLECYSTECTOMY N/A 07/24/2020   Procedure: LAPAROSCOPIC CHOLECYSTECTOMY;  Surgeon: Violeta Gelinas, MD;  Location: [facility] OR;  Service: General;  Laterality: N/A;   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   ENDOSCOPIC RETROGRADE CHOLANGIOPANCREATOGRAPHY (ERCP) WITH PROPOFOL N/A 07/23/2020   Procedure: ENDOSCOPIC RETROGRADE CHOLANGIOPANCREATOGRAPHY (ERCP) WITH PROPOFOL;  Surgeon: Lemar Lofty., MD;  Location: [facility] ENDOSCOPY;  Service: Gastroenterology;  Laterality: N/A;   ENDOSCOPIC RETROGRADE CHOLANGIOPANCREATOGRAPHY (ERCP) WITH PROPOFOL N/A 10/09/2020   Procedure: ENDOSCOPIC RETROGRADE CHOLANGIOPANCREATOGRAPHY (ERCP) WITH PROPOFOL;  Surgeon: Meridee Score Netty Starring., MD;  Location: [facility] ENDOSCOPY;  Service: Gastroenterology;  Laterality: N/A;   ERCP N/A 07/20/2020   Procedure: ENDOSCOPIC RETROGRADE CHOLANGIOPANCREATOGRAPHY (ERCP);  Surgeon: Russella Dar,  Venita Lick, MD;  Location: [facility] ENDOSCOPY;  Service: Endoscopy;  Laterality: N/A;    HEMOSTASIS CONTROL  07/20/2020   Procedure: HEMOSTASIS CONTROL;  Surgeon: Meryl Dare, MD;  Location: [facility] ENDOSCOPY;  Service: Endoscopy;;   REMOVAL OF STONES  07/20/2020   Procedure: REMOVAL OF STONES;  Surgeon: Meryl Dare, MD;  Location: [facility] ENDOSCOPY;  Service: Endoscopy;;   REMOVAL OF STONES  07/23/2020   Procedure: REMOVAL OF STONES;  Surgeon: Lemar Lofty., MD;  Location: [facility] ENDOSCOPY;  Service: Gastroenterology;;   REMOVAL OF STONES  10/09/2020   Procedure: REMOVAL OF STONES;  Surgeon: Lemar Lofty., MD;  Location: [facility] ENDOSCOPY;  Service: Gastroenterology;;   [name]  07/20/2020   Procedure: [name];  Surgeon: Meryl Dare, MD;  Location: [facility] ENDOSCOPY;  Service: Endoscopy;;   SPYGLASS CHOLANGIOSCOPY N/A 07/23/2020   Procedure: [id number] CHOLANGIOSCOPY;  Surgeon: Lemar Lofty., MD;  Location: [facility] ENDOSCOPY;  Service: Gastroenterology;  Laterality: N/A;   SPYGLASS CHOLANGIOSCOPY N/A 10/09/2020   Procedure: SPYGLASS CHOLANGIOSCOPY;  Surgeon: Lemar Lofty., MD;  Location: [facility] ENDOSCOPY;  Service: Gastroenterology;  Laterality: N/A;   SPYGLASS LITHOTRIPSY N/A 07/23/2020   Procedure: UVOZDGUY LITHOTRIPSY;  Surgeon: Lemar Lofty., MD;  Location: [facility] ENDOSCOPY;  Service: Gastroenterology;  Laterality: N/A;   STENT REMOVAL  07/23/2020   Procedure: STENT REMOVAL;  Surgeon: Meridee Score Netty Starring., MD;  Location: [facility] ENDOSCOPY;  Service: Gastroenterology;;   [name] REMOVAL  10/09/2020   Procedure: STENT REMOVAL;  Surgeon: Lemar Lofty., MD;  Location: [facility] ENDOSCOPY;  Service: Gastroenterology;;   Family History  Problem Relation Age of Onset   Hypertension Mother    Hypertension Father    Diabetes Sister    Gallstones Other    Hyperlipidemia Daughter    Other Neg Hx    Colon cancer Neg Hx    Esophageal cancer Neg Hx    Inflammatory bowel disease Neg Hx    Liver disease Neg Hx    Pancreatic cancer Neg Hx     Rectal cancer Neg Hx    Stomach cancer Neg Hx    Outpatient Medications Prior to Visit  Medication Sig Dispense Refill   amLODipine (NORVASC) 5 MG tablet Take 1 tablet (5 mg total) by mouth daily. 90 tablet 3   Cholecalciferol (VITAMIN D3) 50 MCG (2000 UT) CAPS Take 2,000 Units by mouth daily.     losartan (COZAAR) 100 MG tablet Take 1 tablet (100 mg total) by mouth daily. 90 tablet 3   prednisoLONE acetate (PRED FORTE) 1 % ophthalmic suspension Place 1 drop into the right eye daily.     rosuvastatin (CRESTOR) 40 MG tablet Take 1 tablet (40 mg total) by mouth daily. 90 tablet 3   vitamin B-12 (CYANOCOBALAMIN) 1000 MCG tablet Take 1 tablet (1,000 mcg total) by mouth daily. 30 tablet 3   empagliflozin (JARDIANCE) 10 MG TABS tablet Take 1 tablet (10 mg total) by mouth daily before breakfast. 90 tablet 3   No facility-administered medications prior to visit.   Allergies  Allergen Reactions   Aspirin Nausea And Vomiting   Amoxicillin Rash   Clarithromycin Rash   Objective:   Today's Vitals   04/13/23 1507  BP: 126/60  Pulse: 78  Temp: 98 F (36.7 C)  TempSrc: Temporal  SpO2: 99%  Weight: 119 lb (54 kg)  Height: 5\' 1"  (1.549 m)   Body mass index is 22.48 kg/m.   General: Well developed, well nourished. No acute distress. Psych: Alert and oriented. Normal  mood and affect.  Health Maintenance Due  Topic Date Due   OPHTHALMOLOGY EXAM  Never done   DTaP/Tdap/Td (1 - Tdap) Never done   Zoster Vaccines- Shingrix (1 of 2) Never done   Medicare Annual Wellness (AWV)  03/14/2023     Assessment & Plan:   Problem List Items Addressed This Visit       Cardiovascular and Mediastinum   Essential hypertension - Primary   Comparing Ms. Borkenhagen's home BP cuff measurement to a manual BP measurement in the office, her home cuff is reading about 10 mmHg higher on the systolic. Her BP in the clinic today is at goal. I will continue her on amlodipine 5 mg daily and losartan 10 mg daily.         Endocrine   Type 2 diabetes mellitus with renal complication (HCC)   Continue empagliflozin 10 mg daily. I will reassess her A1c in 2 months.      Relevant Medications   empagliflozin (JARDIANCE) 10 MG TABS tablet     Other   Hyperlipidemia, familial, high LDL   Lipids at goal. Continue rosuvastatin 40 mg daily. Ms. Shaffer daughter asked if there would be benefit to doing a coronary artery CT scan for a calcium score. I explained that as Ms. Corigliano is already on a statin, there would be no true benefit to this test.       Return in about 2 months (around 06/13/2023) for Reassessment.   Loyola Mast, MD

## 2023-04-13 NOTE — Assessment & Plan Note (Addendum)
 Lipids at goal. Continue rosuvastatin 40 mg daily. Ms. Crance daughter asked if there would be benefit to doing a coronary artery CT scan for a calcium score. I explained that as Ms. Pinkhasov is already on a statin, there would be no true benefit to this test.

## 2023-04-13 NOTE — Assessment & Plan Note (Signed)
 Continue empagliflozin 10 mg daily. I will reassess her A1c in 2 months.

## 2023-04-14 ENCOUNTER — Encounter: Payer: Self-pay | Admitting: Family Medicine

## 2023-04-15 ENCOUNTER — Encounter: Payer: Self-pay | Admitting: Family Medicine

## 2023-04-19 ENCOUNTER — Ambulatory Visit: Payer: Medicare Other | Admitting: Family Medicine

## 2023-06-22 ENCOUNTER — Ambulatory Visit (INDEPENDENT_AMBULATORY_CARE_PROVIDER_SITE_OTHER): Admitting: Family Medicine

## 2023-06-22 ENCOUNTER — Encounter: Payer: Self-pay | Admitting: Family Medicine

## 2023-06-22 VITALS — BP 130/66 | HR 79 | Temp 97.6°F | Ht 61.0 in | Wt 118.0 lb

## 2023-06-22 DIAGNOSIS — L239 Allergic contact dermatitis, unspecified cause: Secondary | ICD-10-CM | POA: Diagnosis not present

## 2023-06-22 MED ORDER — METHYLPREDNISOLONE 4 MG PO TBPK
ORAL_TABLET | ORAL | 0 refills | Status: DC
Start: 2023-06-22 — End: 2023-12-31

## 2023-06-22 NOTE — Progress Notes (Signed)
 [name] [address] [place] [address] Dept: [phone] Dept Fax: [phone]  Office Visit  Subjective:    Patient ID: Becky Turner, female    DOB: [birth year], 78 y.o..   MRN: [id number]  Chief Complaint  Patient presents with   Facial Swelling    C/o having swelling in face, arms, legs x 2 days.   Has been taking Benadryl.      Interpretation was provided by her daughter, Anselmo Kings. It is [name]'s preference to have her daughter fulfill this role.   History of Present Illness:  Patient is in today with a 2-day history of redness, swelling, and itching to the forearms, the face, and the neck. She denies this being on any other body surfaces. She is having no dyspnea or chest tightness. [name] daughter has been giving her Benadryl. [name] denies use of any new lotions or soaps. She has done some outdoor work, but this has not necessarily involved any weeding.  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 07/21/2022   Pseudophakia, both eyes 01/20/2022   Normocytic anemia 02/26/2021   Osteopenia 02/21/2021   Abdominal pain, epigastric 11/17/2020   History of ERCP 09/11/2020   History of biliary duct stent placement 09/11/2020   History of Helicobacter infection 09/11/2020   History of corneal transplant 09/09/2020   Elevated LFTs    Choledocholithiasis 07/19/2020   Chronic kidney disease, stage 4 (severe) (HCC) 07/19/2020   Corneal edema of right eye 09/22/2019   Hyperlipidemia, familial, high LDL 01/30/2019   Essential hypertension 09/10/2015   Dizziness and giddiness 09/10/2015   Insomnia 09/10/2015   Type 2 diabetes mellitus with renal complication (HCC) 09/10/2015   Disequilibrium 09/10/2015   Past Surgical History:  Procedure Laterality Date   BILIARY  DILATION  10/09/2020   Procedure: BILIARY DILATION;  Surgeon: Normie Becton., MD;  Location: [facility] ENDOSCOPY;  Service: Gastroenterology;;   BILIARY STENT PLACEMENT  07/20/2020   Procedure: BILIARY STENT PLACEMENT;  Surgeon: Asencion Blacksmith, MD;  Location: [facility] ENDOSCOPY;  Service: Endoscopy;;   BILIARY STENT PLACEMENT  07/23/2020   Procedure: BILIARY STENT PLACEMENT;  Surgeon: Normie Becton., MD;  Location: [facility] ENDOSCOPY;  Service: Gastroenterology;;   BIOPSY  07/23/2020   Procedure: BIOPSY;  Surgeon: Normie Becton., MD;  Location: [facility] ENDOSCOPY;  Service: Gastroenterology;;   BIOPSY  10/09/2020   Procedure: BIOPSY;  Surgeon: Normie Becton., MD;  Location: [facility] ENDOSCOPY;  Service: Gastroenterology;;   CATARACT EXTRACTION, BILATERAL  2019   CHOLECYSTECTOMY N/A 07/24/2020   Procedure: LAPAROSCOPIC CHOLECYSTECTOMY;  Surgeon: Dorena Gander, MD;  Location: [facility] OR;  Service: General;  Laterality: N/A;   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   ENDOSCOPIC RETROGRADE CHOLANGIOPANCREATOGRAPHY (ERCP) WITH PROPOFOL  N/A 07/23/2020   Procedure: ENDOSCOPIC RETROGRADE CHOLANGIOPANCREATOGRAPHY (ERCP) WITH PROPOFOL ;  Surgeon: Normie Becton., MD;  Location: [facility] ENDOSCOPY;  Service: Gastroenterology;  Laterality: N/A;   ENDOSCOPIC RETROGRADE CHOLANGIOPANCREATOGRAPHY (ERCP) WITH PROPOFOL  N/A 10/09/2020   Procedure: ENDOSCOPIC RETROGRADE CHOLANGIOPANCREATOGRAPHY (ERCP) WITH PROPOFOL ;  Surgeon: Brice Campi Albino Alu., MD;  Location: [facility] ENDOSCOPY;  Service: Gastroenterology;  Laterality: N/A;   ERCP N/A 07/20/2020   Procedure: ENDOSCOPIC RETROGRADE CHOLANGIOPANCREATOGRAPHY (ERCP);  Surgeon: Asencion Blacksmith, MD;  Location: [facility] ENDOSCOPY;  Service: Endoscopy;  Laterality: N/A;   HEMOSTASIS CONTROL  07/20/2020   Procedure: HEMOSTASIS  CONTROL;  Surgeon: Asencion Blacksmith, MD;  Location: [facility] ENDOSCOPY;  Service: Endoscopy;;   REMOVAL OF STONES  07/20/2020   Procedure: REMOVAL OF  STONES;  Surgeon: Asencion Blacksmith, MD;  Location: [facility] ENDOSCOPY;  Service: Endoscopy;;   REMOVAL OF STONES  07/23/2020   Procedure: REMOVAL OF STONES;  Surgeon: Normie Becton., MD;  Location: [facility] ENDOSCOPY;  Service: Gastroenterology;;   REMOVAL OF STONES  10/09/2020   Procedure: REMOVAL OF STONES;  Surgeon: Normie Becton., MD;  Location: [facility]OSCOPY;  Service: Gastroenterology;;   [address]  07/20/2020   Procedure: SPHINCTEROTOMY;  Surgeon: Asencion Blacksmith, MD;  Location: [facility] ENDOSCOPY;  Service: Endoscopy;;   SPYGLASS CHOLANGIOSCOPY N/A 07/23/2020   Procedure: [id number] CHOLANGIOSCOPY;  Surgeon: Normie Becton., MD;  Location: [facility] ENDOSCOPY;  Service: Gastroenterology;  Laterality: N/A;   SPYGLASS CHOLANGIOSCOPY N/A 10/09/2020   Procedure: SPYGLASS CHOLANGIOSCOPY;  Surgeon: Normie Becton., MD;  Location: [facility] ENDOSCOPY;  Service: Gastroenterology;  Laterality: N/A;   SPYGLASS LITHOTRIPSY N/A 07/23/2020   Procedure: [id number] LITHOTRIPSY;  Surgeon: Normie Becton., MD;  Location: [facility] ENDOSCOPY;  Service: Gastroenterology;  Laterality: N/A;   STENT REMOVAL  07/23/2020   Procedure: STENT REMOVAL;  Surgeon: Brice Campi Albino Alu., MD;  Location: [facility] ENDOSCOPY;  Service: Gastroenterology;;   [name] REMOVAL  10/09/2020   Procedure: STENT REMOVAL;  Surgeon: Normie Becton., MD;  Location: [facility] ENDOSCOPY;  Service: Gastroenterology;;   Family History  Problem Relation Age of Onset   Hypertension Mother    Hypertension Father    Diabetes Sister    Gallstones Other    Hyperlipidemia Daughter    Other Neg Hx    Colon cancer Neg Hx    Esophageal cancer Neg Hx    Inflammatory bowel disease Neg Hx    Liver disease Neg Hx    Pancreatic cancer Neg Hx    Rectal cancer Neg Hx    Stomach cancer Neg Hx    Outpatient Medications Prior to Visit  Medication Sig Dispense Refill   amLODipine  (NORVASC ) 5 MG tablet Take 1 tablet (5 mg total) by mouth daily.  90 tablet 3   Cholecalciferol (VITAMIN D3) 50 MCG (2000 UT) CAPS Take 2,000 Units by mouth daily.     empagliflozin  (JARDIANCE ) 10 MG TABS tablet Take 1 tablet (10 mg total) by mouth daily before breakfast. 90 tablet 3   losartan  (COZAAR ) 100 MG tablet Take 1 tablet (100 mg total) by mouth daily. 90 tablet 3   prednisoLONE  acetate (PRED FORTE ) 1 % ophthalmic suspension Place 1 drop into the right eye daily.     rosuvastatin  (CRESTOR ) 40 MG tablet Take 1 tablet (40 mg total) by mouth daily. 90 tablet 3   vitamin B-12 (CYANOCOBALAMIN ) 1000 MCG tablet Take 1 tablet (1,000 mcg total) by mouth daily. 30 tablet 3   No facility-administered medications prior to visit.   Allergies  Allergen Reactions   Aspirin Nausea And Vomiting   Amoxicillin  Rash   Clarithromycin  Rash     Objective:   Today's Vitals   06/22/23 1049  BP: 130/66  Pulse: 79  Temp: 97.6 F (36.4 C)  TempSrc: Temporal  SpO2: 95%  Weight: 118 lb (53.5 kg)  Height: 5\' 1"  (1.549 m)   Body mass index is 22.3 kg/m.   General: Well developed, well nourished. No acute distress. Skin: There is redness, increased warmth and mild swelling to the volar surface of the forearms. There are smaller   areas of a similar reaction  to the cheeks and the neck. There is a small amount of scaliness ont he face.  Health Maintenance Due  Topic Date Due   DTaP/Tdap/Td (1 - Tdap) Never done   Zoster Vaccines- Shingrix (1 of 2) Never done   Medicare Annual Wellness (AWV)  03/14/2023     Assessment & Plan:   Problem List Items Addressed This Visit   None Visit Diagnoses       Allergic dermatitis    -  Primary   Appears to be an acute contact dermatitis, likely an environmental exposure. Cotninue Benadryl 25-50 mg q 8 hours PRN itching. I will prescribe a Medrol dosepak   Relevant Medications   methylPREDNISolone (MEDROL DOSEPAK) 4 MG TBPK tablet       Return for Follow-up as scheduled.   Graig Lawyer, MD

## 2023-06-30 ENCOUNTER — Ambulatory Visit (INDEPENDENT_AMBULATORY_CARE_PROVIDER_SITE_OTHER): Admitting: Family Medicine

## 2023-06-30 ENCOUNTER — Encounter: Payer: Self-pay | Admitting: Family Medicine

## 2023-06-30 ENCOUNTER — Ambulatory Visit: Payer: Self-pay | Admitting: Family Medicine

## 2023-06-30 VITALS — BP 130/64 | HR 72 | Temp 97.6°F | Ht 61.0 in | Wt 118.0 lb

## 2023-06-30 DIAGNOSIS — N2581 Secondary hyperparathyroidism of renal origin: Secondary | ICD-10-CM | POA: Diagnosis not present

## 2023-06-30 DIAGNOSIS — L239 Allergic contact dermatitis, unspecified cause: Secondary | ICD-10-CM

## 2023-06-30 DIAGNOSIS — D649 Anemia, unspecified: Secondary | ICD-10-CM

## 2023-06-30 DIAGNOSIS — E7849 Other hyperlipidemia: Secondary | ICD-10-CM | POA: Diagnosis not present

## 2023-06-30 DIAGNOSIS — E1122 Type 2 diabetes mellitus with diabetic chronic kidney disease: Secondary | ICD-10-CM

## 2023-06-30 DIAGNOSIS — I10 Essential (primary) hypertension: Secondary | ICD-10-CM | POA: Diagnosis not present

## 2023-06-30 DIAGNOSIS — N184 Chronic kidney disease, stage 4 (severe): Secondary | ICD-10-CM | POA: Diagnosis not present

## 2023-06-30 DIAGNOSIS — Z7984 Long term (current) use of oral hypoglycemic drugs: Secondary | ICD-10-CM | POA: Diagnosis not present

## 2023-06-30 LAB — LIPID PANEL
Cholesterol: 504 mg/dL — ABNORMAL HIGH (ref 0–200)
HDL: 87.9 mg/dL (ref >39.00)
LDL Cholesterol: 384 mg/dL — ABNORMAL HIGH (ref 0–99)
NonHDL: 416.33
Total CHOL/HDL Ratio: 6
Triglycerides: 162 mg/dL — ABNORMAL HIGH (ref 0.0–149.0)
VLDL: 32.4 mg/dL (ref 0.0–40.0)

## 2023-06-30 LAB — CBC
HCT: 37.1 % (ref 36.0–46.0)
Hemoglobin: 12 g/dL (ref 12.0–15.0)
MCHC: 32.2 g/dL (ref 30.0–36.0)
MCV: 86.4 fl (ref 78.0–100.0)
Platelets: 248 10*3/uL (ref 150.0–400.0)
RBC: 4.3 Mil/uL (ref 3.87–5.11)
RDW: 14.7 % (ref 11.5–15.5)
WBC: 6.9 10*3/uL (ref 4.0–10.5)

## 2023-06-30 LAB — BASIC METABOLIC PANEL WITH GFR
BUN: 33 mg/dL — ABNORMAL HIGH (ref 6–23)
CO2: 28 meq/L (ref 19–32)
Calcium: 9.3 mg/dL (ref 8.4–10.5)
Chloride: 102 meq/L (ref 96–112)
Creatinine, Ser: 1.51 mg/dL — ABNORMAL HIGH (ref 0.40–1.20)
GFR: 32.95 mL/min — ABNORMAL LOW (ref >60.00)
Glucose, Bld: 90 mg/dL (ref 70–99)
Potassium: 4.2 meq/L (ref 3.5–5.1)
Sodium: 140 meq/L (ref 135–145)

## 2023-06-30 LAB — VITAMIN B12: Vitamin B-12: 1142 pg/mL — ABNORMAL HIGH (ref 211–911)

## 2023-06-30 LAB — PHOSPHORUS: Phosphorus: 4 mg/dL (ref 2.3–4.6)

## 2023-06-30 LAB — HEMOGLOBIN A1C: Hgb A1c MFr Bld: 6.1 % (ref 4.6–6.5)

## 2023-06-30 MED ORDER — EZETIMIBE 10 MG PO TABS
10.0000 mg | ORAL_TABLET | Freq: Every day | ORAL | 3 refills | Status: DC
Start: 2023-06-30 — End: 2023-10-29

## 2023-06-30 NOTE — Assessment & Plan Note (Signed)
 Becky Turner BP has been in acceptable control. I will continue her on amlodipine  5 mg daily and losartan  100 mg daily.

## 2023-06-30 NOTE — Assessment & Plan Note (Signed)
 I will reassess her lipids today. Continue rosuvastatin  40 mg daily.

## 2023-06-30 NOTE — Assessment & Plan Note (Signed)
 Following with nephrology. I will reassess her PTH, calcium  and phosphorous today.

## 2023-06-30 NOTE — Assessment & Plan Note (Signed)
 Continue focus on blood pressure and glucose control, adequate hydration, and avoidance of nephrotoxic medications. Continue ARB and SGLT2i

## 2023-06-30 NOTE — Progress Notes (Signed)
 [name] [address] [place] [address] Dept: [phone] Dept Fax: [phone]  Chronic Care Office Visit  Subjective:    Patient ID: Becky Turner, female    DOB: 1945/10/25, 78 y.o..   MRN: [id number]  Chief Complaint  Patient presents with   Hypertension    2 month f/u HTN.    BP at home 130's/60's   c/o ? Vitamin d  level.    History of Present Illness:  Patient is in today for reassessment of chronic medical issues. Interpretation was provided by her daughter, Anselmo Kings. It is Ms. Hofmann's preference to have her daughter fulfill this role.   Ms. Petrea has a history of hypertension. She is managed on losartan  100 mg daily and amlodipine  5 mg daily. She continues to have episodic lightheadedness every few weeks.   Ms. Stjames has a history of hyperlipidemia. She is currently on rosuvastatin  40 mg daily.    Ms. Vollrath has Type 2 diabetes. She is on empagliflozin  10 mg daily.   Ms. Easterwood has a stage 4 CKD. She has evidence of secondary hyperparathyroidism and normocytic anemia. Sheis followed by Dr. Jearldine Mina (nephrology). She is currently on both an ARB and an SGLT2i.  I recently saw Ms. Hagey with an allergic dermatitis/hive reaction. I treated her with a course of prednisone  and Benadryl. She has had complete resolution of her rash.  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 07/21/2022   Pseudophakia, both eyes 01/20/2022   Normocytic anemia 02/26/2021   Osteopenia 02/21/2021   History of ERCP 09/11/2020   History of biliary duct stent placement 09/11/2020   History of Helicobacter infection 09/11/2020   History of corneal transplant 09/09/2020   Choledocholithiasis 07/19/2020   Chronic kidney disease, stage 4 (severe) (HCC) 07/19/2020   Corneal edema of right eye 09/22/2019    Hyperlipidemia, familial, high LDL 01/30/2019   Essential hypertension 09/10/2015   Dizziness and giddiness 09/10/2015   Insomnia 09/10/2015   Type 2 diabetes mellitus with renal complication (HCC) 09/10/2015   Disequilibrium 09/10/2015   Past Surgical History:  Procedure Laterality Date   BILIARY DILATION  10/09/2020   Procedure: BILIARY DILATION;  Surgeon: Normie Becton., MD;  Location: [facility]COPY;  Service: Gastroenterology;;   BILIARY STENT PLACEMENT  07/20/2020   Procedure: BILIARY STENT PLACEMENT;  Surgeon: Asencion Blacksmith, MD;  Location: [facility] ENDOSCOPY;  Service: Endoscopy;;   BILIARY STENT PLACEMENT  07/23/2020   Procedure: BILIARY STENT PLACEMENT;  Surgeon: Normie Becton., MD;  Location: [facility] ENDOSCOPY;  Service: Gastroenterology;;   BIOPSY  07/23/2020   Procedure: BIOPSY;  Surgeon: Normie Becton., MD;  Location: [facility] ENDOSCOPY;  Service: Gastroenterology;;   BIOPSY  10/09/2020   Procedure: BIOPSY;  Surgeon: Normie Becton., MD;  Location: [facility] ENDOSCOPY;  Service: Gastroenterology;;   CATARACT EXTRACTION, BILATERAL  2019   CHOLECYSTECTOMY N/A 07/24/2020   Procedure: LAPAROSCOPIC CHOLECYSTECTOMY;  Surgeon: Dorena Gander, MD;  Location: [facility] OR;  Service: General;  Laterality: N/A;   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   ENDOSCOPIC RETROGRADE CHOLANGIOPANCREATOGRAPHY (ERCP) WITH PROPOFOL  N/A 07/23/2020   Procedure: ENDOSCOPIC RETROGRADE CHOLANGIOPANCREATOGRAPHY (ERCP) WITH PROPOFOL ;  Surgeon: Normie Becton., MD;  Location: [facility] ENDOSCOPY;  Service: Gastroenterology;  Laterality: N/A;   ENDOSCOPIC RETROGRADE CHOLANGIOPANCREATOGRAPHY (ERCP) WITH PROPOFOL  N/A 10/09/2020   Procedure: ENDOSCOPIC RETROGRADE CHOLANGIOPANCREATOGRAPHY (ERCP) WITH  PROPOFOL ;  Surgeon: Brice Campi Albino Alu., MD;  Location: [facility]OSCOPY;  Service: Gastroenterology;  Laterality: N/A;   ERCP N/A 07/20/2020   Procedure: ENDOSCOPIC RETROGRADE CHOLANGIOPANCREATOGRAPHY  (ERCP);  Surgeon: Asencion Blacksmith, MD;  Location: [facility] ENDOSCOPY;  Service: Endoscopy;  Laterality: N/A;   HEMOSTASIS CONTROL  07/20/2020   Procedure: HEMOSTASIS CONTROL;  Surgeon: Asencion Blacksmith, MD;  Location: [facility] ENDOSCOPY;  Service: Endoscopy;;   REMOVAL OF STONES  07/20/2020   Procedure: REMOVAL OF STONES;  Surgeon: Asencion Blacksmith, MD;  Location: [facility] ENDOSCOPY;  Service: Endoscopy;;   REMOVAL OF STONES  07/23/2020   Procedure: REMOVAL OF STONES;  Surgeon: Normie Becton., MD;  Location: [facility] ENDOSCOPY;  Service: Gastroenterology;;   REMOVAL OF STONES  10/09/2020   Procedure: REMOVAL OF STONES;  Surgeon: Normie Becton., MD;  Location: [facility]OSCOPY;  Service: Gastroenterology;;   [address]  07/20/2020   Procedure: [address];  Surgeon: Asencion Blacksmith, MD;  Location: [facility] ENDOSCOPY;  Service: Endoscopy;;   SPYGLASS CHOLANGIOSCOPY N/A 07/23/2020   Procedure: [id number] CHOLANGIOSCOPY;  Surgeon: Normie Becton., MD;  Location: [facility] ENDOSCOPY;  Service: Gastroenterology;  Laterality: N/A;   SPYGLASS CHOLANGIOSCOPY N/A 10/09/2020   Procedure: SPYGLASS CHOLANGIOSCOPY;  Surgeon: Normie Becton., MD;  Location: [facility] ENDOSCOPY;  Service: Gastroenterology;  Laterality: N/A;   SPYGLASS LITHOTRIPSY N/A 07/23/2020   Procedure: [id number] LITHOTRIPSY;  Surgeon: Normie Becton., MD;  Location: [facility] ENDOSCOPY;  Service: Gastroenterology;  Laterality: N/A;   STENT REMOVAL  07/23/2020   Procedure: STENT REMOVAL;  Surgeon: Brice Campi Albino Alu., MD;  Location: [facility] ENDOSCOPY;  Service: Gastroenterology;;   [name] REMOVAL  10/09/2020   Procedure: STENT REMOVAL;  Surgeon: Normie Becton., MD;  Location: [facility] ENDOSCOPY;  Service: Gastroenterology;;   Family History  Problem Relation Age of Onset   Hypertension Mother    Hypertension Father    Diabetes Sister    Gallstones Other    Hyperlipidemia Daughter    Other Neg Hx    Colon cancer Neg Hx    Esophageal  cancer Neg Hx    Inflammatory bowel disease Neg Hx    Liver disease Neg Hx    Pancreatic cancer Neg Hx    Rectal cancer Neg Hx    Stomach cancer Neg Hx    Outpatient Medications Prior to Visit  Medication Sig Dispense Refill   amLODipine  (NORVASC ) 5 MG tablet Take 1 tablet (5 mg total) by mouth daily. 90 tablet 3   Cholecalciferol (VITAMIN D3) 50 MCG (2000 UT) CAPS Take 2,000 Units by mouth daily.     empagliflozin  (JARDIANCE ) 10 MG TABS tablet Take 1 tablet (10 mg total) by mouth daily before breakfast. 90 tablet 3   losartan  (COZAAR ) 100 MG tablet Take 1 tablet (100 mg total) by mouth daily. 90 tablet 3   methylPREDNISolone  (MEDROL  DOSEPAK) 4 MG TBPK tablet Take per package instructions. 21 tablet 0   prednisoLONE  acetate (PRED FORTE ) 1 % ophthalmic suspension Place 1 drop into the right eye daily.     rosuvastatin  (CRESTOR ) 40 MG tablet Take 1 tablet (40 mg total) by mouth daily. 90 tablet 3   vitamin B-12 (CYANOCOBALAMIN ) 1000 MCG tablet Take 1 tablet (1,000 mcg total) by mouth daily. 30 tablet 3   No facility-administered medications prior to visit.   Allergies  Allergen Reactions   Aspirin Nausea And Vomiting   Amoxicillin  Rash   Clarithromycin  Rash   Objective:   Today's Vitals   06/30/23 1019  BP: 130/64  Pulse:  72  Temp: 97.6 F (36.4 C)  TempSrc: Temporal  SpO2: 100%  Weight: 118 lb (53.5 kg)  Height: 5\' 1"  (1.549 m)   Body mass index is 22.3 kg/m.   General: Well developed, well nourished. No acute distress. Skin: Warm and dry. No rashes. Psych: Alert and oriented. Normal mood and affect.  Health Maintenance Due  Topic Date Due   DTaP/Tdap/Td (1 - Tdap) Never done   Zoster Vaccines- Shingrix (1 of 2) Never done   Medicare Annual Wellness (AWV)  03/14/2023     Assessment & Plan:   Problem List Items Addressed This Visit       Cardiovascular and Mediastinum   Essential hypertension   Ms. Stinson's BP has been in acceptable control. I will continue her  on amlodipine  5 mg daily and losartan  100 mg daily.      Relevant Orders   Basic metabolic panel with GFR     Endocrine   Secondary hyperparathyroidism of renal origin [facility])   Following with nephrology. I will reassess her PTH, calcium  and phosphorous today.      Relevant Orders   Phosphorus   Parathyroid  hormone, intact (no Ca)   Type 2 diabetes mellitus with renal complication (HCC) - Primary   Continue empagliflozin  10 mg daily. I will reassess her A1c today.      Relevant Orders   Basic metabolic panel with GFR   Hemoglobin A1c     Genitourinary   Chronic kidney disease, stage 4 (severe) (HCC)   Continue focus on blood pressure and glucose control, adequate hydration, and avoidance of nephrotoxic medications. Continue ARB and SGLT2i      Relevant Orders   CBC   Phosphorus   Parathyroid  hormone, intact (no Ca)     Other   Hyperlipidemia, familial, high LDL   I will reassess her lipids today. Continue rosuvastatin  40 mg daily.       Relevant Orders   Lipid panel   Normocytic anemia   We will reassess her anemia today. As she has had a borderline low B12 in the past, I will recheck this.      Relevant Orders   CBC   Vitamin B12   Other Visit Diagnoses       Allergic dermatitis       Resolved.       Return in about 3 months (around 09/30/2023) for Reassessment.   Graig Lawyer, MD

## 2023-06-30 NOTE — Assessment & Plan Note (Signed)
We will reassess her anemia today. As she has had a borderline low B12 in the past, I will recheck this.

## 2023-06-30 NOTE — Assessment & Plan Note (Signed)
 Continue empagliflozin  10 mg daily. I will reassess her A1c today.

## 2023-07-01 LAB — PARATHYROID HORMONE, INTACT (NO CA): PTH: 48 pg/mL (ref 16–77)

## 2023-07-01 NOTE — Telephone Encounter (Signed)
 Patient's daughter, Anselmo Kings, notified VIA phone that RX is ready for her at [facility].  [facility]

## 2023-07-03 IMAGING — RF DG ERCP WO/W SPHINCTEROTOMY
1 series · 14 of 14 positions shown · non-contrast
Comparison: Prior ERCP 07/23/2020; 07/20/2020

CLINICAL DATA: Choledocholithiasis. History of prior ERCP and
biliary stent placement.

EXAM:
ERCP
TECHNIQUE: Multiple spot images obtained with the fluoroscopic device and
submitted for interpretation post-procedure.
FLUOROSCOPY TIME:  Fluoroscopy Time:  5 minutes 28 seconds
Number of Acquired Spot Images: 0

[Series 1: run · 11 acquisitions, 14 frames shown]
[im 1/11]
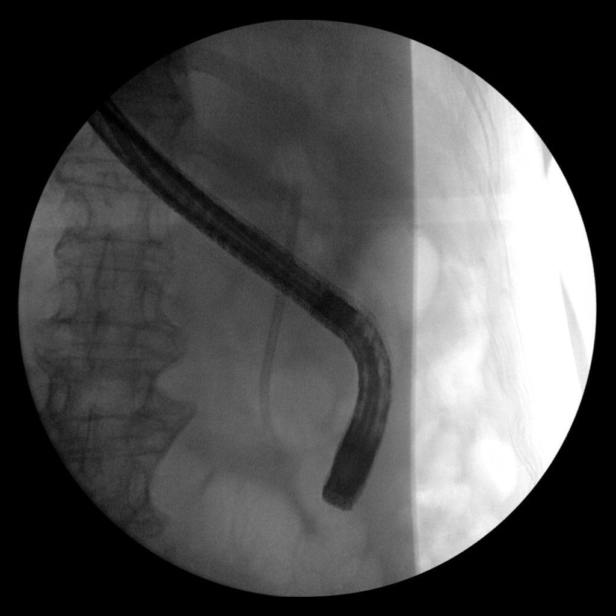
[im 2/11]
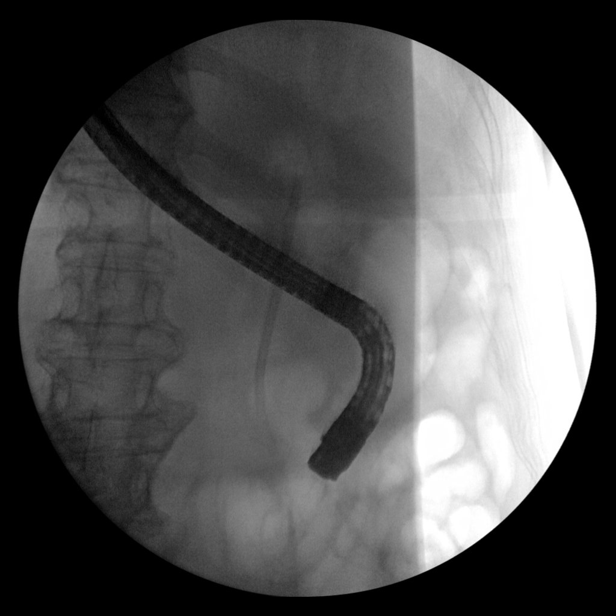
[im 3/11]
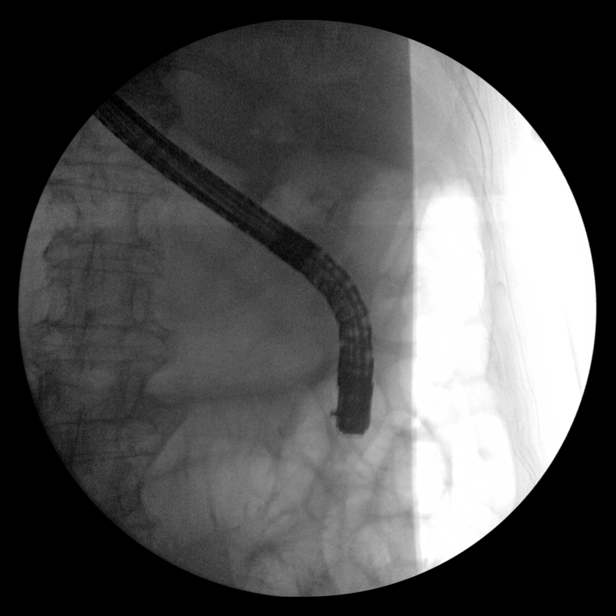
[im 4/11]
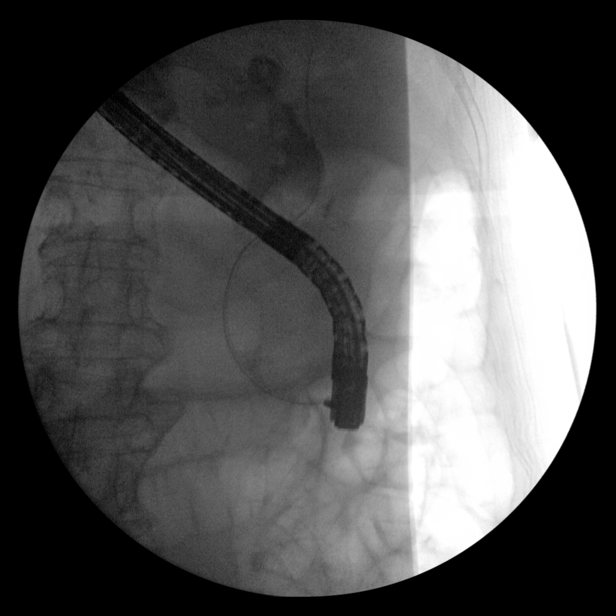
[im 5/11]
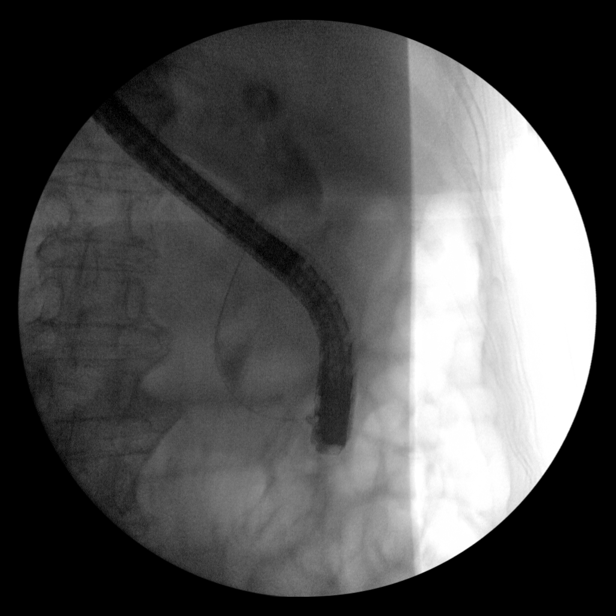
[im 6/11]
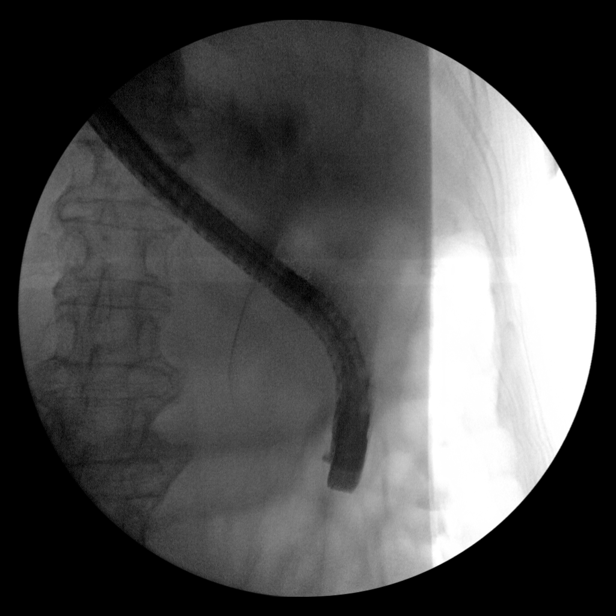
[im 7/11]
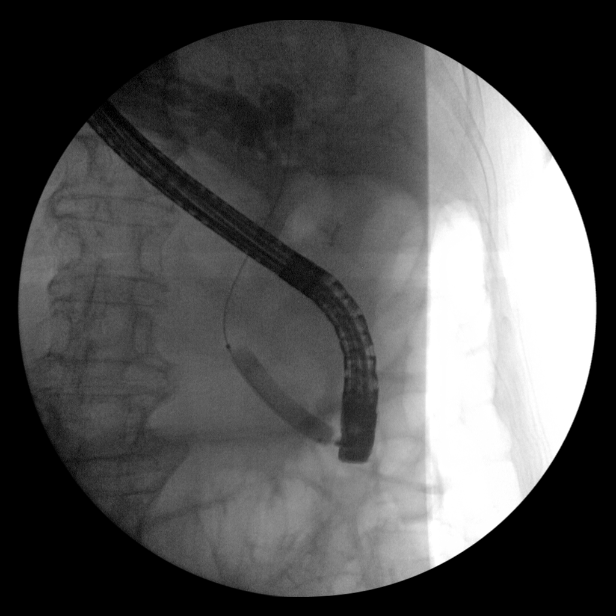
[im 8/11]
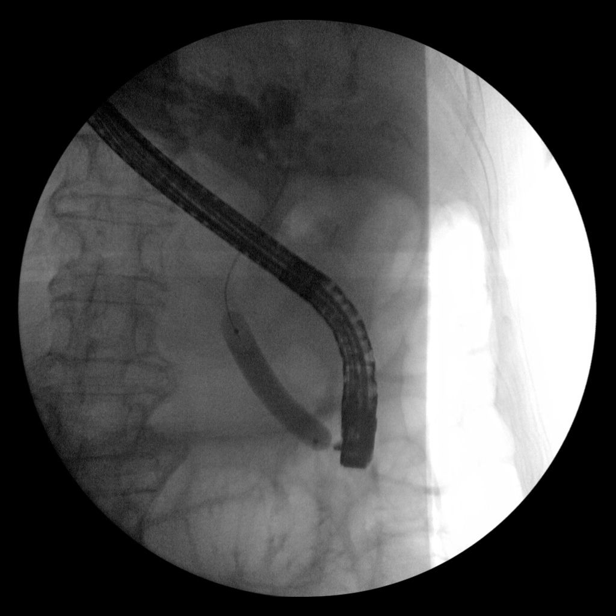
[im 9/11]
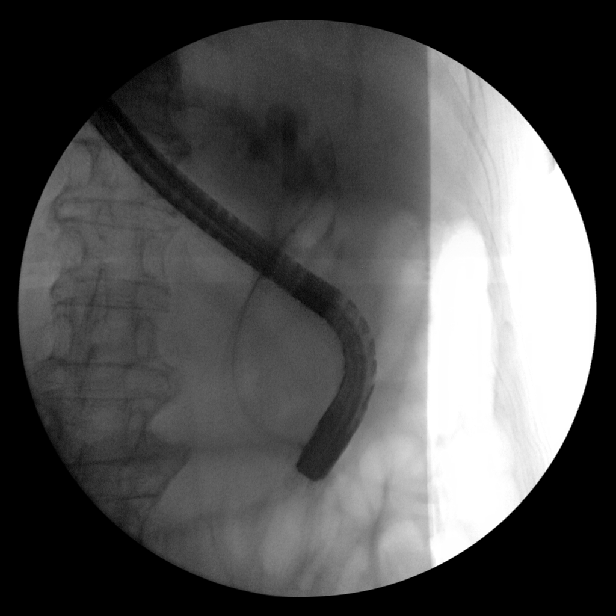
[im 9/11]
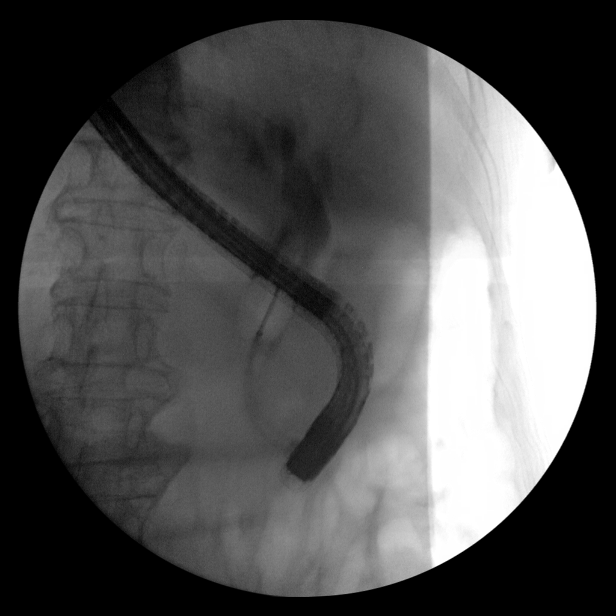
[im 9/11]
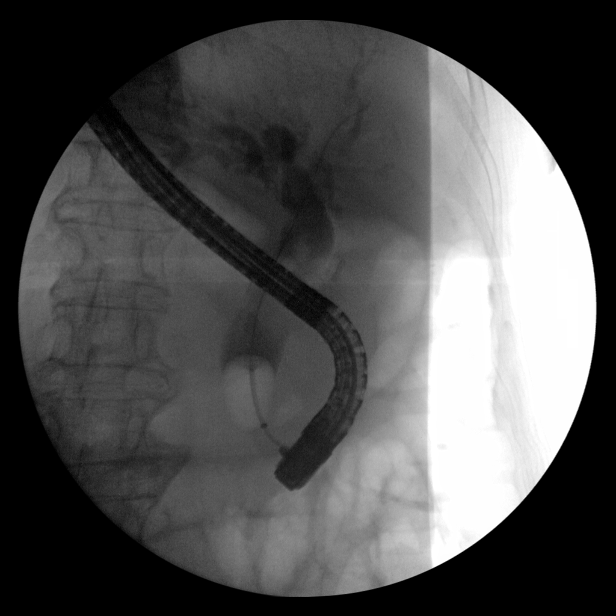
[im 9/11]
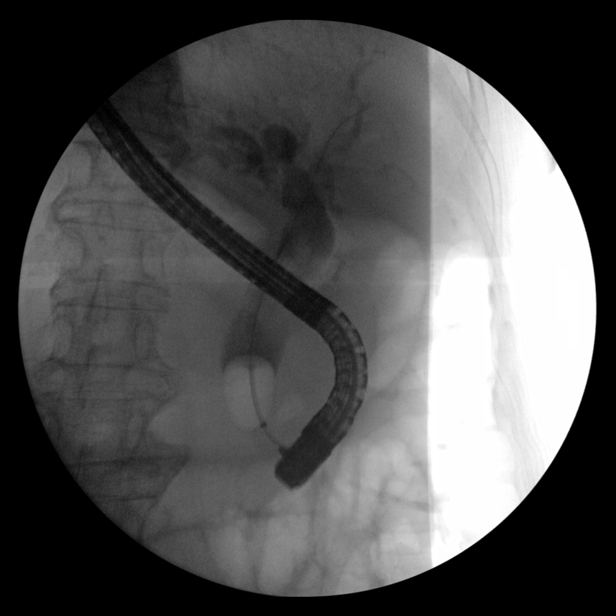
[im 10/11]
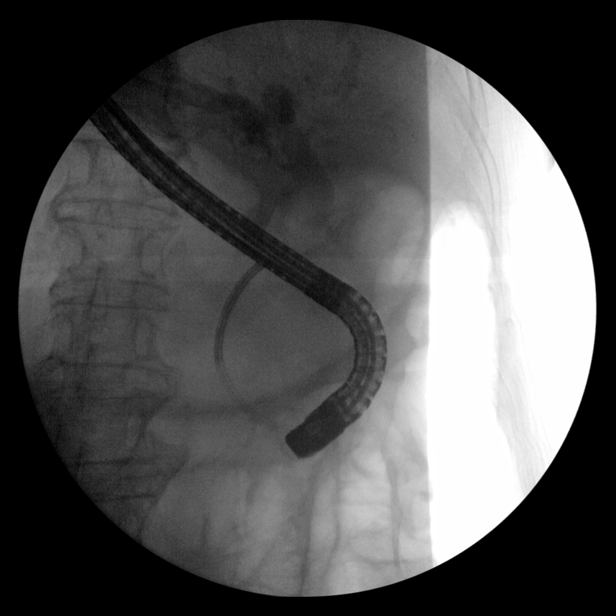
[im 11/11]
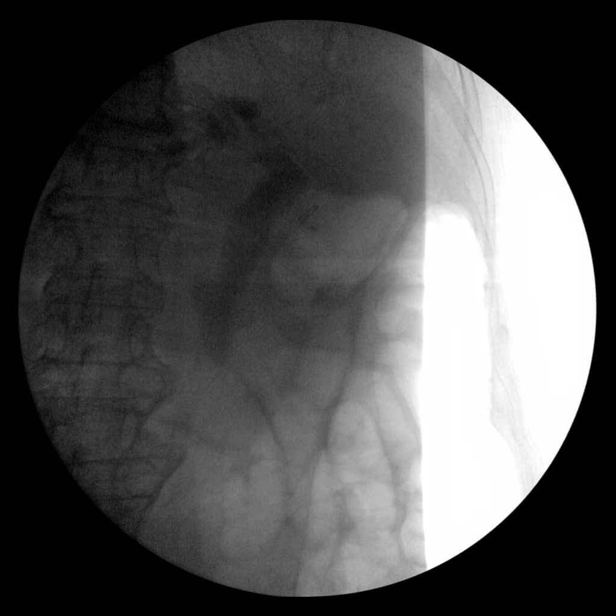

[14 of 14 positions shown; findings below may reference images not displayed]

FINDINGS: Sixteen intraoperative saved images are submitted for review. The
images demonstrate a flexible duodenal scope in the descending
duodenum with a plastic biliary stent in place. Subsequently, the
stent is removed, and the bile ducts cannulated. Initial
cholangiography demonstrates filling defects concerning for
choledocholithiasis. Subsequent images demonstrate sphincterotomy,
balloon dilation of the distal common bile duct, removal of the
initially visualized filling defects and also advancement of a
spyglass scope into the biliary tree.
IMPRESSION: ERCP with sphincterotomy, balloon sphincterotomy, sweeping of the
biliary system and spyglass cholangioscopy.

These images were submitted for radiologic interpretation only.
Please see the procedural report for the amount of contrast and the
fluoroscopy time utilized.

## 2023-08-10 DIAGNOSIS — N1832 Chronic kidney disease, stage 3b: Secondary | ICD-10-CM | POA: Diagnosis not present

## 2023-08-18 DIAGNOSIS — I129 Hypertensive chronic kidney disease with stage 1 through stage 4 chronic kidney disease, or unspecified chronic kidney disease: Secondary | ICD-10-CM | POA: Diagnosis not present

## 2023-08-18 DIAGNOSIS — N281 Cyst of kidney, acquired: Secondary | ICD-10-CM | POA: Diagnosis not present

## 2023-08-18 DIAGNOSIS — N1832 Chronic kidney disease, stage 3b: Secondary | ICD-10-CM | POA: Diagnosis not present

## 2023-08-18 DIAGNOSIS — E1122 Type 2 diabetes mellitus with diabetic chronic kidney disease: Secondary | ICD-10-CM | POA: Diagnosis not present

## 2023-09-10 ENCOUNTER — Other Ambulatory Visit: Payer: Self-pay | Admitting: Family Medicine

## 2023-09-10 DIAGNOSIS — E1122 Type 2 diabetes mellitus with diabetic chronic kidney disease: Secondary | ICD-10-CM

## 2023-10-01 ENCOUNTER — Encounter: Payer: Self-pay | Admitting: Family Medicine

## 2023-10-01 ENCOUNTER — Ambulatory Visit: Payer: Self-pay | Admitting: Family Medicine

## 2023-10-01 ENCOUNTER — Ambulatory Visit: Admitting: Family Medicine

## 2023-10-01 VITALS — BP 118/66 | HR 70 | Temp 97.1°F | Ht 61.0 in | Wt 118.4 lb

## 2023-10-01 DIAGNOSIS — R42 Dizziness and giddiness: Secondary | ICD-10-CM | POA: Diagnosis not present

## 2023-10-01 DIAGNOSIS — E7849 Other hyperlipidemia: Secondary | ICD-10-CM

## 2023-10-01 DIAGNOSIS — E1122 Type 2 diabetes mellitus with diabetic chronic kidney disease: Secondary | ICD-10-CM

## 2023-10-01 DIAGNOSIS — N184 Chronic kidney disease, stage 4 (severe): Secondary | ICD-10-CM | POA: Diagnosis not present

## 2023-10-01 DIAGNOSIS — Z7984 Long term (current) use of oral hypoglycemic drugs: Secondary | ICD-10-CM

## 2023-10-01 DIAGNOSIS — I10 Essential (primary) hypertension: Secondary | ICD-10-CM

## 2023-10-01 LAB — GLUCOSE, RANDOM: Glucose, Bld: 103 mg/dL — ABNORMAL HIGH (ref 70–99)

## 2023-10-01 LAB — HEMOGLOBIN A1C: Hgb A1c MFr Bld: 6.4 % (ref 4.6–6.5)

## 2023-10-01 NOTE — Assessment & Plan Note (Signed)
 Continue focus on blood pressure and glucose control, adequate hydration, and avoidance of nephrotoxic medications. Continue ARB and SGLT2i

## 2023-10-01 NOTE — Assessment & Plan Note (Signed)
 I will reassess her lipids today. Continue rosuvastatin  40 mg daily.

## 2023-10-01 NOTE — Assessment & Plan Note (Signed)
 Ms. Wickware BP is in good control. I will continue her on amlodipine  5 mg daily and losartan  100 mg daily.

## 2023-10-01 NOTE — Progress Notes (Signed)
 [name] [address] [place] [address] Dept: [phone] Dept Fax: [phone]  Chronic Care Office Visit  Subjective:    Patient ID: Becky Turner, female    DOB: 07-13-1945, 78 y.o..   MRN: [id number]  Chief Complaint  Patient presents with   Hypertension    3 month f/u HTN/DM.  C/o having dizziness when standing and with weather changes.    History of Present Illness:  Patient is in today for reassessment of chronic medical issues. Interpretation was provided by her daughter, Veva. It is [name]'s preference to have her daughter fulfill this role.   [name] has a history of hypertension. She is managed on losartan  100 mg daily and amlodipine  5 mg daily. She continues to have episodic lightheadedness every few weeks. This has been a bit more pronounced since the hot weather has been here. she is tryign to limit her time outdoors in the heat and is trying to hydrate well.   [name] has a history of hyperlipidemia. She is currently on rosuvastatin  40 mg daily.    [name] has Type 2 diabetes. She is on empagliflozin  10 mg daily.   [name] has a stage 4 CKD. She has evidence of secondary hyperparathyroidism and normocytic anemia. She is followed by Dr. Marlee (nephrology). She is currently on both an ARB and an SGLT2i.   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   Choledocholithiasis 07/19/2020   Chronic kidney disease, stage 4 (severe) (HCC) 07/19/2020   Corneal edema  of right eye 09/22/2019   Hyperlipidemia, familial, high LDL 01/30/2019   Essential hypertension 09/10/2015   Dizziness and giddiness 09/10/2015   Insomnia 09/10/2015   Type 2 diabetes mellitus with renal complication (HCC) 09/10/2015   Disequilibrium 09/10/2015   Past Surgical History:  Procedure Laterality Date   BILIARY DILATION  10/09/2020   Procedure: BILIARY DILATION;  Surgeon: Wilhelmenia Aloha Raddle., MD;  Location: [facility];  Service: Gastroenterology;;   BILIARY STENT PLACEMENT  07/20/2020   Procedure: BILIARY STENT PLACEMENT;  Surgeon: Aneita Gwendlyn DASEN, MD;  Location: [facility] ENDOSCOPY;  Service: Endoscopy;;   BILIARY STENT PLACEMENT  07/23/2020   Procedure: BILIARY STENT PLACEMENT;  Surgeon: Wilhelmenia Aloha Raddle., MD;  Location: [facility] ENDOSCOPY;  Service: Gastroenterology;;   BIOPSY  07/23/2020   Procedure: BIOPSY;  Surgeon: Wilhelmenia Aloha Raddle., MD;  Location: [facility] ENDOSCOPY;  Service: Gastroenterology;;   BIOPSY  10/09/2020   Procedure: BIOPSY;  Surgeon: Wilhelmenia Aloha Raddle., MD;  Location: [facility] ENDOSCOPY;  Service: Gastroenterology;;   CATARACT EXTRACTION, BILATERAL  2019   CHOLECYSTECTOMY N/A 07/24/2020   Procedure: LAPAROSCOPIC CHOLECYSTECTOMY;  Surgeon: Sebastian Moles, MD;  Location: [facility] OR;  Service: General;  Laterality: N/A;   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   ENDOSCOPIC RETROGRADE CHOLANGIOPANCREATOGRAPHY (ERCP) WITH PROPOFOL  N/A 07/23/2020   Procedure: ENDOSCOPIC RETROGRADE CHOLANGIOPANCREATOGRAPHY (ERCP) WITH PROPOFOL ;  Surgeon: Wilhelmenia Aloha Raddle., MD;  Location: [facility] ENDOSCOPY;  Service: Gastroenterology;  Laterality: N/A;   ENDOSCOPIC RETROGRADE CHOLANGIOPANCREATOGRAPHY (ERCP) WITH PROPOFOL  N/A 10/09/2020   Procedure: ENDOSCOPIC  RETROGRADE CHOLANGIOPANCREATOGRAPHY (ERCP) WITH PROPOFOL ;  Surgeon: Wilhelmenia Aloha Raddle., MD;  Location: [facility];  Service: Gastroenterology;  Laterality: N/A;   ERCP N/A 07/20/2020   Procedure: ENDOSCOPIC  RETROGRADE CHOLANGIOPANCREATOGRAPHY (ERCP);  Surgeon: Aneita Gwendlyn DASEN, MD;  Location: [facility] ENDOSCOPY;  Service: Endoscopy;  Laterality: N/A;   HEMOSTASIS CONTROL  07/20/2020   Procedure: HEMOSTASIS CONTROL;  Surgeon: Aneita Gwendlyn DASEN, MD;  Location: [facility] ENDOSCOPY;  Service: Endoscopy;;   REMOVAL OF STONES  07/20/2020   Procedure: REMOVAL OF STONES;  Surgeon: Aneita Gwendlyn DASEN, MD;  Location: [facility] ENDOSCOPY;  Service: Endoscopy;;   REMOVAL OF STONES  07/23/2020   Procedure: REMOVAL OF STONES;  Surgeon: Wilhelmenia Aloha Raddle., MD;  Location: [facility] ENDOSCOPY;  Service: Gastroenterology;;   REMOVAL OF STONES  10/09/2020   Procedure: REMOVAL OF STONES;  Surgeon: Wilhelmenia Aloha Raddle., MD;  Location: [facility];  Service: Gastroenterology;;   ANNETT  07/20/2020   Procedure: ANNETT;  Surgeon: Aneita Gwendlyn DASEN, MD;  Location: [facility] ENDOSCOPY;  Service: Endoscopy;;   SPYGLASS CHOLANGIOSCOPY N/A 07/23/2020   Procedure: DEBHOJDD CHOLANGIOSCOPY;  Surgeon: Wilhelmenia Aloha Raddle., MD;  Location: [facility] ENDOSCOPY;  Service: Gastroenterology;  Laterality: N/A;   SPYGLASS CHOLANGIOSCOPY N/A 10/09/2020   Procedure: SPYGLASS CHOLANGIOSCOPY;  Surgeon: Wilhelmenia Aloha Raddle., MD;  Location: [facility]COPY;  Service: Gastroenterology;  Laterality: N/A;   SPYGLASS LITHOTRIPSY N/A 07/23/2020   Procedure: DEBHOJDD LITHOTRIPSY;  Surgeon: Wilhelmenia Aloha Raddle., MD;  Location: [facility] ENDOSCOPY;  Service: Gastroenterology;  Laterality: N/A;   STENT REMOVAL  07/23/2020   Procedure: STENT REMOVAL;  Surgeon: Wilhelmenia Aloha Raddle., MD;  Location: [facility] ENDOSCOPY;  Service: Gastroenterology;;   CLEDA REMOVAL  10/09/2020   Procedure: STENT REMOVAL;  Surgeon: Wilhelmenia Aloha Raddle., MD;  Location: [facility] ENDOSCOPY;  Service: Gastroenterology;;   Family History  Problem Relation Age of Onset   Hypertension Mother    Hypertension Father    Diabetes Sister    Gallstones Other    Hyperlipidemia Daughter    Other Neg Hx     Colon cancer Neg Hx    Esophageal cancer Neg Hx    Inflammatory bowel disease Neg Hx    Liver disease Neg Hx    Pancreatic cancer Neg Hx    Rectal cancer Neg Hx    Stomach cancer Neg Hx    Outpatient Medications Prior to Visit  Medication Sig Dispense Refill   amLODipine  (NORVASC ) 5 MG tablet Take 1 tablet (5 mg total) by mouth daily. 90 tablet 3   Cholecalciferol (VITAMIN D3) 50 MCG (2000 UT) CAPS Take 2,000 Units by mouth daily.     empagliflozin  (JARDIANCE ) 10 MG TABS tablet Take 1 tablet (10 mg total) by mouth daily before breakfast. 90 tablet 3   ezetimibe  (ZETIA ) 10 MG tablet Take 1 tablet (10 mg total) by mouth daily. 90 tablet 3   losartan  (COZAAR ) 100 MG tablet Take 1 tablet (100 mg total) by mouth daily. 90 tablet 3   methylPREDNISolone  (MEDROL  DOSEPAK) 4 MG TBPK tablet Take per package instructions. 21 tablet 0   prednisoLONE  acetate (PRED FORTE ) 1 % ophthalmic suspension Place 1 drop into the right eye daily.     rosuvastatin  (CRESTOR ) 40 MG tablet Take 1 tablet (40 mg total) by mouth daily. 90 tablet 3   vitamin B-12 (CYANOCOBALAMIN ) 1000 MCG tablet Take 1 tablet (1,000 mcg total) by mouth daily. 30 tablet 3   No facility-administered medications prior to visit.   Allergies  Allergen Reactions   Aspirin Nausea And Vomiting  Amoxicillin  Rash   Clarithromycin  Rash   Objective:   Today's Vitals   10/01/23 1256  BP: 118/66  Pulse: 70  Temp: (!) 97.1 F (36.2 C)  TempSrc: Temporal  SpO2: 98%  Weight: 118 lb 6.4 oz (53.7 kg)  Height: 5' 1 (1.549 m)   Body mass index is 22.37 kg/m.   General: Well developed, well nourished. No acute distress. Psych: Alert and oriented. Normal mood and affect.  Health Maintenance Due  Topic Date Due   Zoster Vaccines- Shingrix (1 of 2) Never done   Medicare Annual Wellness (AWV)  03/14/2023     Assessment & Plan:   Problem List Items Addressed This Visit       Cardiovascular and Mediastinum   Essential hypertension    Ms. Bluitt's BP is in good control. I will continue her on amlodipine  5 mg daily and losartan  100 mg daily.        Endocrine   Type 2 diabetes mellitus with renal complication (HCC) - Primary   Stable and has been at goal. Continue empagliflozin  10 mg daily. I will reassess her A1c today.      Relevant Orders   Glucose, random (Completed)   Hemoglobin A1c (Completed)     Genitourinary   Chronic kidney disease, stage 4 (severe) (HCC)   Continue focus on blood pressure and glucose control, adequate hydration, and avoidance of nephrotoxic medications. Continue ARB and SGLT2i        Other   Dizziness and giddiness   Dizziness sounds like orthostasis. Her BP is acceptable and this has been a chronic complaint. I feel we can watch this for now. Encourage adequate hydration.      Hyperlipidemia, familial, high LDL   I will reassess her lipids today. Continue rosuvastatin  40 mg daily.        Return in about 3 months (around 01/01/2024) for Reassessment.   Garnette CHRISTELLA Simpler, MD

## 2023-10-01 NOTE — Assessment & Plan Note (Signed)
 Dizziness sounds like orthostasis. Her BP is acceptable and this has been a chronic complaint. I feel we can watch this for now. Encourage adequate hydration.

## 2023-10-01 NOTE — Assessment & Plan Note (Signed)
 Stable and has been at goal. Continue empagliflozin  10 mg daily. I will reassess her A1c today.

## 2023-10-29 ENCOUNTER — Encounter: Payer: Self-pay | Admitting: Family Medicine

## 2023-10-29 ENCOUNTER — Ambulatory Visit (INDEPENDENT_AMBULATORY_CARE_PROVIDER_SITE_OTHER): Admitting: Internal Medicine

## 2023-10-29 ENCOUNTER — Encounter (HOSPITAL_BASED_OUTPATIENT_CLINIC_OR_DEPARTMENT_OTHER): Payer: Self-pay | Admitting: Internal Medicine

## 2023-10-29 VITALS — BP 154/70 | HR 78 | Resp 17 | Ht 61.0 in | Wt 119.0 lb

## 2023-10-29 DIAGNOSIS — Z91148 Patient's other noncompliance with medication regimen for other reason: Secondary | ICD-10-CM | POA: Diagnosis not present

## 2023-10-29 DIAGNOSIS — E7849 Other hyperlipidemia: Secondary | ICD-10-CM

## 2023-10-29 DIAGNOSIS — Z79899 Other long term (current) drug therapy: Secondary | ICD-10-CM

## 2023-10-29 MED ORDER — EZETIMIBE 10 MG PO TABS: 10.0000 mg | ORAL_TABLET | Freq: Every day | ORAL | 3 refills | Status: AC

## 2023-10-29 MED ORDER — ROSUVASTATIN CALCIUM 40 MG PO TABS: 40.0000 mg | ORAL_TABLET | Freq: Every day | ORAL | 3 refills | Status: AC

## 2023-10-29 NOTE — Progress Notes (Signed)
 LIPID CLINIC CONSULT NOTE  Chief Complaint:  Manage dyslipidemia  Primary Care Physician: Thedora Garnette HERO, MD  Primary Cardiologist:  None  HPI:  Becky Turner is a 78 y.o. female who is being seen today for the evaluation of dyslipidemia at the request of Rudd, Garnette HERO, MD. This is a pleasant 79 year old [place] female who is accompanied by her daughter today and she is translating for her.  She is a former patient of Dr. Ladona but has not been seen in a number of years.  She has a history of dyslipidemia, type 2 diabetes, chronic kidney disease, and hypertension.  Has previously been on ezetimibe  and rosuvastatin  40 mg daily in addition to Repatha .  She reports recent noncompliance with the medications.  Lipid testing in May 2025 showed total cholesterol 504, triglycerides 162, HDL 87 and LDL 384 (up from 109 previously).  Her daughter noted that she had been noncompliant with the medications.  PMHx:  Past Medical History:  Diagnosis Date   Anemia    Diabetes mellitus without complication (HCC)    Hyperlipidemia, familial, high LDL 01/30/2019   Hypertension    Prediabetes     Past Surgical History:  Procedure Laterality Date   BILIARY DILATION  10/09/2020   Procedure: BILIARY DILATION;  Surgeon: Wilhelmenia Aloha Raddle., MD;  Location: [facility];  Service: Gastroenterology;;   BILIARY STENT PLACEMENT  07/20/2020   Procedure: BILIARY STENT PLACEMENT;  Surgeon: Aneita Gwendlyn DASEN, MD;  Location: [facility] ENDOSCOPY;  Service: Endoscopy;;   BILIARY STENT PLACEMENT  07/23/2020   Procedure: BILIARY STENT PLACEMENT;  Surgeon: Wilhelmenia Aloha Raddle., MD;  Location: [facility] ENDOSCOPY;  Service: Gastroenterology;;   BIOPSY  07/23/2020   Procedure: BIOPSY;  Surgeon: Wilhelmenia Aloha Raddle., MD;  Location: [facility] ENDOSCOPY;  Service: Gastroenterology;;   BIOPSY  10/09/2020   Procedure: BIOPSY;  Surgeon: Wilhelmenia Aloha Raddle., MD;  Location: [facility];  Service: Gastroenterology;;   CATARACT  EXTRACTION, BILATERAL  2019   CHOLECYSTECTOMY N/A 07/24/2020   Procedure: LAPAROSCOPIC CHOLECYSTECTOMY;  Surgeon: Sebastian Moles, MD;  Location: [facility] OR;  Service: General;  Laterality: N/A;   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   ENDOSCOPIC RETROGRADE CHOLANGIOPANCREATOGRAPHY (ERCP) WITH PROPOFOL  N/A 07/23/2020   Procedure: ENDOSCOPIC RETROGRADE CHOLANGIOPANCREATOGRAPHY (ERCP) WITH PROPOFOL ;  Surgeon: Wilhelmenia Aloha Raddle., MD;  Location: [facility] ENDOSCOPY;  Service: Gastroenterology;  Laterality: N/A;   ENDOSCOPIC RETROGRADE CHOLANGIOPANCREATOGRAPHY (ERCP) WITH PROPOFOL  N/A 10/09/2020   Procedure: ENDOSCOPIC RETROGRADE CHOLANGIOPANCREATOGRAPHY (ERCP) WITH PROPOFOL ;  Surgeon: Wilhelmenia Aloha Raddle., MD;  Location: [facility]COPY;  Service: Gastroenterology;  Laterality: N/A;   ERCP N/A 07/20/2020   Procedure: ENDOSCOPIC RETROGRADE CHOLANGIOPANCREATOGRAPHY (ERCP);  Surgeon: Aneita Gwendlyn DASEN, MD;  Location: [facility] ENDOSCOPY;  Service: Endoscopy;  Laterality: N/A;   HEMOSTASIS CONTROL  07/20/2020   Procedure: HEMOSTASIS CONTROL;  Surgeon: Aneita Gwendlyn DASEN, MD;  Location: [facility] ENDOSCOPY;  Service: Endoscopy;;   REMOVAL OF STONES  07/20/2020   Procedure: REMOVAL OF STONES;  Surgeon: Aneita Gwendlyn DASEN, MD;  Location: [facility] ENDOSCOPY;  Service: Endoscopy;;   REMOVAL OF STONES  07/23/2020   Procedure: REMOVAL OF STONES;  Surgeon: Wilhelmenia Aloha Raddle., MD;  Location: [facility] ENDOSCOPY;  Service: Gastroenterology;;   REMOVAL OF STONES  10/09/2020   Procedure: REMOVAL OF STONES;  Surgeon: Wilhelmenia Aloha Raddle., MD;  Location: [facility];  Service: Gastroenterology;;   ANNETT  07/20/2020   Procedure: ANNETT;  Surgeon: Aneita Gwendlyn DASEN, MD;  Location: [facility] ENDOSCOPY;  Service: Endoscopy;;   SPYGLASS CHOLANGIOSCOPY N/A 07/23/2020  Procedure: SPYGLASS CHOLANGIOSCOPY;  Surgeon: Wilhelmenia Aloha Raddle., MD;  Location: [facility] ENDOSCOPY;  Service: Gastroenterology;  Laterality: N/A;   SPYGLASS  CHOLANGIOSCOPY N/A 10/09/2020   Procedure: SPYGLASS CHOLANGIOSCOPY;  Surgeon: Wilhelmenia Aloha Raddle., MD;  Location: [facility] ENDOSCOPY;  Service: Gastroenterology;  Laterality: N/A;   SPYGLASS LITHOTRIPSY N/A 07/23/2020   Procedure: DEBHOJDD LITHOTRIPSY;  Surgeon: Wilhelmenia Aloha Raddle., MD;  Location: [facility] ENDOSCOPY;  Service: Gastroenterology;  Laterality: N/A;   STENT REMOVAL  07/23/2020   Procedure: STENT REMOVAL;  Surgeon: Wilhelmenia Aloha Raddle., MD;  Location: [facility] ENDOSCOPY;  Service: Gastroenterology;;   CLEDA REMOVAL  10/09/2020   Procedure: STENT REMOVAL;  Surgeon: Wilhelmenia Aloha Raddle., MD;  Location: [facility] ENDOSCOPY;  Service: Gastroenterology;;    FAMHx:  Family History  Problem Relation Age of Onset   Hypertension Mother    Hypertension Father    Diabetes Sister    Gallstones Other    Hyperlipidemia Daughter    Other Neg Hx    Colon cancer Neg Hx    Esophageal cancer Neg Hx    Inflammatory bowel disease Neg Hx    Liver disease Neg Hx    Pancreatic cancer Neg Hx    Rectal cancer Neg Hx    Stomach cancer Neg Hx     SOCHx:   reports that she has never smoked. She has never used smokeless tobacco. She reports that she does not drink alcohol and does not use drugs.  ALLERGIES:  Allergies  Allergen Reactions   Aspirin Nausea And Vomiting   Amoxicillin  Rash   Clarithromycin  Rash    ROS: Pertinent items noted in HPI and remainder of comprehensive ROS otherwise negative.  HOME MEDS: Current Outpatient Medications on File Prior to Visit  Medication Sig Dispense Refill   amLODipine  (NORVASC ) 5 MG tablet Take 1 tablet (5 mg total) by mouth daily. 90 tablet 3   Cholecalciferol (VITAMIN D3) 50 MCG (2000 UT) CAPS Take 2,000 Units by mouth daily.     empagliflozin  (JARDIANCE ) 10 MG TABS tablet Take 1 tablet (10 mg total) by mouth daily before breakfast. 90 tablet 3   ezetimibe  (ZETIA ) 10 MG tablet Take 1 tablet (10 mg total) by mouth daily. 90 tablet 3   losartan  (COZAAR ) 100  MG tablet Take 1 tablet (100 mg total) by mouth daily. 90 tablet 3   prednisoLONE  acetate (PRED FORTE ) 1 % ophthalmic suspension Place 1 drop into the right eye daily.     rosuvastatin  (CRESTOR ) 40 MG tablet Take 1 tablet (40 mg total) by mouth daily. (Patient taking differently: Take 40 mg by mouth every other day.) 90 tablet 3   methylPREDNISolone  (MEDROL  DOSEPAK) 4 MG TBPK tablet Take per package instructions. (Patient not taking: Reported on 10/29/2023) 21 tablet 0   No current facility-administered medications on file prior to visit.    LABS/IMAGING: No results found for this or any previous visit (from the past 48 hours). No results found.  LIPID PANEL:    Component Value Date/Time   CHOL 504 (H) 06/30/2023 1040   CHOL 156 01/29/2020 0823   TRIG 162.0 (H) 06/30/2023 1040   HDL 87.90 06/30/2023 1040   HDL 65 01/29/2020 0823   CHOLHDL 6 06/30/2023 1040   VLDL 32.4 06/30/2023 1040   LDLCALC 384 (H) 06/30/2023 1040   LDLCALC 62 01/29/2020 0823    No results found for: LIPOA   WEIGHTS: Wt Readings from Last 3 Encounters:  10/29/23 119 lb (54 kg)  10/01/23 118 lb 6.4 oz (53.7 kg)  06/30/23 118 lb (53.5 kg)    VITALS: BP (!) 154/70 (BP Location: Left Arm, Patient Position: Sitting, Cuff Size: Normal)   Pulse 78   Resp 17   Ht 5' 1 (1.549 m)   Wt 119 lb (54 kg)   SpO2 98%   BMI 22.48 kg/m   EXAM: Deferred  EKG: Deferred  ASSESSMENT: Probable familial hyperlipidemia, LDL greater than 190 Medication noncompliance Prediabetes, goal LDL less than 70 Hypertension  PLAN: 1.   Ms. Harrington has a probable familial hyperlipidemia.  She previously had good control of her lipids however was on multiple therapies and has been noncompliant with that.  Total cholesterol is now over 500.  Will plan to restart her ezetimibe , atorvastatin and Repatha .  Will assess for LP(a) and per her daughter's request obtain a coronary calcium  score.  Although this may not change her  management it may be helpful to reinforce importance of medication compliance if there is known underlying coronary disease.  Plan repeat lipids in about 3 to 4 months.  Vinie KYM Maxcy, MD, [facility], FNLA, FACP  [place]  [facility]  Medical Director of the Advanced Lipid Disorders &  Cardiovascular Risk Reduction Clinic Diplomate of the American Board of Clinical Lipidology Attending Cardiologist  Direct Dial: [phone]  Fax: [phone]  Website:  [email or web address] Vinie JAYSON Maxcy 10/29/2023, 4:37 PM

## 2023-10-29 NOTE — Patient Instructions (Addendum)
 Medication Instructions:   PLEASE START BACK TAKING YOUR ROSUVASTATIN  (CRESTOR ) 40 MG BY MOUTH DAILY  PLEASE RESTART BACK TAKING ZETIA  10 MG BY MOUTH DAILY  Dr. Mona recommends REPATHA (PCSK9). This is an injectable cholesterol medication self-administered once every 14 days. This medication will likely need prior approval with your insurance company, which we will work on. If the medication is not approved initially, we may need to do an appeal with your insurance. If approved, we will provide you with copay and cost information. We'll then send the prescription to your pharmacy. We would have you complete another set of fasting labs between 3-4 months to reassess cholesterol.   REPATHA is self-injected once every 14 days in subcutaneous or fatty tissue - such as belly or side/outer/upper thigh. It is best stored in the refrigerator but is stable at room temp up to 28 days. Please take the pen-injector out of fridge about 30 minutes - 1 hour prior to injection, to allow it to warm closer to room temperature.   This medication is very effective in lowering LDL and can lower LPa, as Dr. Mona mentioned. It is also generally well tolerated -- most common reaction may be cold-like symptoms such as runny nose, scratchy throat, as this is an antibody therapy. It is generally self-limiting and after a few doses, your body should have normalized to the medication.   Here is a demo video: [email or web address].schwartz.org/   If you need a co-pay card for Repatha: Lawsponsor.fr If you need a co-pay card for Praluent : [email or web address]://[email or web address]  Patient Assistance:    These foundations have funds at various times.   The [employer] Foundation: https://www.panfoundation.org/disease-funds/hypercholesterolemia/ -- can sign up for wait list  The [facility] offers assistance to help pay for medication copays.  They will cover copays for all  cholesterol lowering meds, including statins, fibrates, omega-3 fish oils like Vascepa, ezetimibe , Repatha, Praluent , Nexletol, Nexlizet.  The cards are usually good for $2,500 or 12 months, whichever comes first. Our fax # is [phone] (you will need this to apply) Go to healthwellfoundation.org Click on "Apply Now" Answer questions as to whom is applying (patient or representative) Your disease fund will be "hypercholesterolemia - Medicare access" They will ask questions about finances and which medications you are taking for cholesterol When you submit, the approval is usually within minutes.  You will need to print the card information from the site You will need to show this information to your pharmacy, they will bill your Medicare Part D plan first -then bill Health Well --for the copay.   You can also call them at [phone], although the hold times can be quite long.     *If you need a refill on your cardiac medications before your next appointment, please call your pharmacy*   Lab Work:  IN 3 MONTHS (AROUND 12/19) HERE AT [facility] ON 3RD FLOOR [address]--NMR LIPOPROFILE AND LIPOPROTEIN A--PLEASE COME FASTING TO THIS LAB APPOINTMENT--PLEASE HAVE THIS DONE PRIOR TO SEEING MICHELLE SWINYER NP FOR YOUR 3 MONTH FOLLOW-UP  If you have labs (blood work) drawn today and your tests are completely normal, you will receive your results only by: MyChart Message (if you have MyChart) OR A paper copy in the mail If you have any lab test that is abnormal or we need to change your treatment, we will call you to review the results.  TESTING:  CARDIAC CALCIUM  SCORE (SELF PAY)--OUR SCHEDULING DEPARTMENT WILL CALL YOU NEXT WEEK TO ARRANGE THIS APPOINTMENT   Follow-Up:  IN 3 MONTHS WITH MICHELLE SWINYER, NP IN LIPID CLINIC--YOU WILL RECEIVE A CALL BACK TO GET THIS APPOINTMENT SCHEDULED--PLEASE HAVE YOUR LABS DONE PRIOR TO THIS APPOINTMENT

## 2023-11-01 ENCOUNTER — Telehealth (HOSPITAL_BASED_OUTPATIENT_CLINIC_OR_DEPARTMENT_OTHER): Payer: Self-pay | Admitting: *Deleted

## 2023-11-01 ENCOUNTER — Other Ambulatory Visit (HOSPITAL_COMMUNITY): Payer: Self-pay

## 2023-11-01 ENCOUNTER — Telehealth: Payer: Self-pay | Admitting: Pharmacy Technician

## 2023-11-01 MED ORDER — REPATHA SURECLICK 140 MG/ML ~~LOC~~ SOAJ: 140.0000 mg | SUBCUTANEOUS | 6 refills | Status: DC

## 2023-11-01 NOTE — Telephone Encounter (Signed)
  Pharmacy Patient Advocate Encounter   Received notification from STAFF that prior authorization for REPATHA  is required/requested.   Insurance verification completed.   The patient is insured through [place] .   Per test claim: PA required; PA submitted to above mentioned insurance via Latent Key/confirmation #/EOC [id number] Status is pending   Pharmacy Patient Advocate Encounter  Received notification from [employer] that Prior Authorization for REPATHA  has been APPROVED from 11/01/23 to 02/09/24   PA #/Case ID/Reference #: [id number]

## 2023-11-01 NOTE — Addendum Note (Signed)
 Addended by: GLADIS PORTER HERO on: 11/01/2023 01:37 PM   Modules accepted: Orders

## 2023-11-01 NOTE — Telephone Encounter (Signed)
-----   Message from [place] S sent at 11/01/2023  2:03 PM EDT ----- Regarding: RE: 3 month follow-up in lipid clinic with Rosaline Bane NP Pt has been scheduled with Rosaline for 12/24 at 10:30 am, and Terri will call and schedule Calcium  Score with central scheduling. ----- Message ----- From: Gladis Porter HERO, LPN Sent: [id number]   5:16 PM EDT To: Alan Nina Doffing A Hall-Turner Subject: FW: 3 month follow-up in lipid clinic with M#  In addition to below she also needs a Calcium  Score scheduled.  Order is in and please call the daughter to arrange both 3 month lipid with Rosaline and Calcium  Score  Thanks, Porter ----- Message ----- From: Gladis Porter HERO, LPN Sent: [phone]   5:11 PM EDT To: Alan Nina Doffing A Hall-Turner Subject: 3 month follow-up in lipid clinic with Miche#  Dr. Mona would like for this pt to have a 3 month follow-up appt with Rosaline Bane, NP for lipids  Pt will need an interpreter  Can you please call and schedule then shoot me the date?  Thanks, [employer]

## 2023-11-01 NOTE — Telephone Encounter (Signed)
 Pts daughter Veva ([place]) is aware of PA approval for Repatha  and this was sent into the pts pharmacy [facility] with PA information attached, to be filled.   Terri verbalized understanding and was gracious for the call back.

## 2023-11-12 ENCOUNTER — Ambulatory Visit (HOSPITAL_BASED_OUTPATIENT_CLINIC_OR_DEPARTMENT_OTHER)
Admission: RE | Admit: 2023-11-12 | Discharge: 2023-11-12 | Disposition: A | Discharge status: Home / Self Care | Payer: Self-pay | Source: Ambulatory Visit | Attending: Internal Medicine | Admitting: Internal Medicine

## 2023-11-12 DIAGNOSIS — Z79899 Other long term (current) drug therapy: Secondary | ICD-10-CM | POA: Insufficient documentation

## 2023-11-12 DIAGNOSIS — E7849 Other hyperlipidemia: Secondary | ICD-10-CM | POA: Insufficient documentation

## 2023-11-14 ENCOUNTER — Ambulatory Visit: Payer: Self-pay | Admitting: Internal Medicine

## 2023-11-14 DIAGNOSIS — Z01812 Encounter for preprocedural laboratory examination: Secondary | ICD-10-CM

## 2023-11-14 DIAGNOSIS — I2510 Atherosclerotic heart disease of native coronary artery without angina pectoris: Secondary | ICD-10-CM

## 2023-11-14 DIAGNOSIS — R5383 Other fatigue: Secondary | ICD-10-CM

## 2023-11-15 ENCOUNTER — Encounter: Payer: Self-pay | Admitting: Family Medicine

## 2023-11-15 DIAGNOSIS — I2510 Atherosclerotic heart disease of native coronary artery without angina pectoris: Secondary | ICD-10-CM | POA: Insufficient documentation

## 2023-11-15 MED ORDER — METOPROLOL TARTRATE 100 MG PO TABS: 100.0000 mg | ORAL_TABLET | ORAL | 0 refills | Status: DC

## 2023-11-26 ENCOUNTER — Encounter (HOSPITAL_COMMUNITY): Payer: Self-pay

## 2023-12-01 NOTE — Telephone Encounter (Signed)
 Scheduled for cor CTA 12/07/23

## 2023-12-02 NOTE — Addendum Note (Signed)
 Addended by: LORING ANDRIETTE HERO on: 12/02/2023 02:27 PM   Modules accepted: Orders

## 2023-12-03 DIAGNOSIS — Z79899 Other long term (current) drug therapy: Secondary | ICD-10-CM | POA: Diagnosis not present

## 2023-12-03 DIAGNOSIS — E7849 Other hyperlipidemia: Secondary | ICD-10-CM | POA: Diagnosis not present

## 2023-12-03 LAB — BASIC METABOLIC PANEL WITH GFR
BUN/Creatinine Ratio: 18 (ref 12–28)
BUN: 34 mg/dL — ABNORMAL HIGH (ref 8–27)
CO2: 19 mmol/L — ABNORMAL LOW (ref 20–29)
Calcium: 9.3 mg/dL (ref 8.7–10.3)
Chloride: 103 mmol/L (ref 96–106)
Creatinine, Ser: 1.89 mg/dL — ABNORMAL HIGH (ref 0.57–1.00)
Glucose: 91 mg/dL (ref 70–99)
Potassium: 4 mmol/L (ref 3.5–5.2)
Sodium: 141 mmol/L (ref 134–144)
eGFR: 27 mL/min/1.73 — ABNORMAL LOW (ref >59)

## 2023-12-04 LAB — NMR, LIPOPROFILE
Cholesterol, Total: 147 mg/dL (ref 100–199)
HDL Particle Number: 37.2 umol/L (ref >=30.5)
HDL-C: 87 mg/dL (ref >39)
LDL Particle Number: 355 nmol/L (ref <1000)
LDL Size: 21 nm (ref >20.5)
LDL-C (NIH Calc): 41 mg/dL (ref 0–99)
LP-IR Score: <25 (ref <=45)
Small LDL Particle Number: <90 nmol/L (ref <=527)
Triglycerides: 105 mg/dL (ref 0–149)

## 2023-12-06 ENCOUNTER — Telehealth (HOSPITAL_COMMUNITY): Payer: Self-pay | Admitting: *Deleted

## 2023-12-06 LAB — LIPOPROTEIN A (LPA): Lipoprotein (a): 95.9 nmol/L — ABNORMAL HIGH (ref <75.0)

## 2023-12-06 MED ORDER — LOSARTAN POTASSIUM 50 MG PO TABS
50.0000 mg | ORAL_TABLET | Freq: Every day | ORAL | 1 refills | Status: AC
Start: 2023-12-06 — End: 2024-03-05

## 2023-12-06 NOTE — Addendum Note (Signed)
 Addended by: LORING ANDRIETTE HERO on: 12/06/2023 10:02 AM   Modules accepted: Orders

## 2023-12-06 NOTE — Telephone Encounter (Signed)
 Calling patient to cancel her CCTA appointment due to the patient's kidney function.  The daughter answered the phone and verbalized understanding.  Chantal Requena RN Navigator Cardiac Imaging [facility] [phone] Office [phone] Cell

## 2023-12-06 NOTE — Addendum Note (Signed)
 Addended by: LORING ANDRIETTE HERO on: 12/06/2023 03:31 PM   Modules accepted: Orders

## 2023-12-07 ENCOUNTER — Ambulatory Visit (HOSPITAL_COMMUNITY)
Admission: RE | Admit: 2023-12-07 | Source: Ambulatory Visit | Attending: Internal Medicine | Admitting: Internal Medicine

## 2023-12-07 NOTE — Telephone Encounter (Signed)
 Losartan  50mg  sent to [facility] 12/06/23

## 2023-12-10 DIAGNOSIS — Z23 Encounter for immunization: Secondary | ICD-10-CM | POA: Diagnosis not present

## 2023-12-27 ENCOUNTER — Other Ambulatory Visit (HOSPITAL_COMMUNITY): Payer: Self-pay | Admitting: *Deleted

## 2023-12-27 ENCOUNTER — Encounter (HOSPITAL_COMMUNITY): Payer: Self-pay

## 2023-12-27 DIAGNOSIS — E7849 Other hyperlipidemia: Secondary | ICD-10-CM

## 2023-12-27 DIAGNOSIS — I2510 Atherosclerotic heart disease of native coronary artery without angina pectoris: Secondary | ICD-10-CM

## 2023-12-27 DIAGNOSIS — R5383 Other fatigue: Secondary | ICD-10-CM

## 2023-12-29 ENCOUNTER — Other Ambulatory Visit (HOSPITAL_COMMUNITY): Payer: Self-pay | Admitting: Internal Medicine

## 2023-12-29 ENCOUNTER — Encounter (HOSPITAL_COMMUNITY)
Admission: RE | Admit: 2023-12-29 | Discharge: 2023-12-29 | Disposition: A | Discharge status: Home / Self Care | Source: Ambulatory Visit | Attending: Internal Medicine | Admitting: Internal Medicine

## 2023-12-29 DIAGNOSIS — I2510 Atherosclerotic heart disease of native coronary artery without angina pectoris: Secondary | ICD-10-CM

## 2023-12-29 DIAGNOSIS — R5383 Other fatigue: Secondary | ICD-10-CM | POA: Diagnosis not present

## 2023-12-29 LAB — NM PET CT CARDIAC PERFUSION MULTI W/ABSOLUTE BLOODFLOW
MBFR: 1.5
Nuc Rest EF: 67 %
Nuc Stress EF: 73 %
Rest MBF: 1.46 ml/g/min
Rest Nuclear Isotope Dose: 14 mCi
ST Depression (mm): 0 mm
Stress MBF: 2.19 ml/g/min
TID: 0.9

## 2023-12-29 MED ORDER — RUBIDIUM RB82 GENERATOR (RUBYFILL)
14.0000 | PACK | Freq: Once | INTRAVENOUS | Status: AC
  Administered 2023-12-29: 14 via INTRAVENOUS

## 2023-12-29 MED ORDER — RUBIDIUM RB82 GENERATOR (RUBYFILL)
14.0400 | PACK | Freq: Once | INTRAVENOUS | Status: AC
  Administered 2023-12-29: 14.04 via INTRAVENOUS

## 2023-12-29 MED ORDER — REGADENOSON 0.4 MG/5ML IV SOLN
INTRAVENOUS | Status: AC
  Filled 2023-12-29: qty 5

## 2023-12-29 MED ORDER — REGADENOSON 0.4 MG/5ML IV SOLN
0.4000 mg | Freq: Once | INTRAVENOUS | Status: AC
  Administered 2023-12-29: 0.4 mg via INTRAVENOUS

## 2023-12-31 ENCOUNTER — Ambulatory Visit: Admitting: Family Medicine

## 2023-12-31 ENCOUNTER — Encounter: Payer: Self-pay | Admitting: Family Medicine

## 2023-12-31 ENCOUNTER — Ambulatory Visit: Payer: Self-pay | Admitting: Internal Medicine

## 2023-12-31 ENCOUNTER — Ambulatory Visit: Payer: Self-pay | Admitting: Family Medicine

## 2023-12-31 VITALS — BP 130/60 | HR 66 | Temp 97.7°F | Ht 61.0 in | Wt 117.8 lb

## 2023-12-31 DIAGNOSIS — E7849 Other hyperlipidemia: Secondary | ICD-10-CM

## 2023-12-31 DIAGNOSIS — E1122 Type 2 diabetes mellitus with diabetic chronic kidney disease: Secondary | ICD-10-CM | POA: Diagnosis not present

## 2023-12-31 DIAGNOSIS — Z7984 Long term (current) use of oral hypoglycemic drugs: Secondary | ICD-10-CM

## 2023-12-31 DIAGNOSIS — N184 Chronic kidney disease, stage 4 (severe): Secondary | ICD-10-CM

## 2023-12-31 DIAGNOSIS — I10 Essential (primary) hypertension: Secondary | ICD-10-CM

## 2023-12-31 DIAGNOSIS — I2510 Atherosclerotic heart disease of native coronary artery without angina pectoris: Secondary | ICD-10-CM | POA: Diagnosis not present

## 2023-12-31 LAB — GLUCOSE, RANDOM: Glucose, Bld: 102 mg/dL — ABNORMAL HIGH (ref 70–99)

## 2023-12-31 LAB — HEMOGLOBIN A1C: Hgb A1c MFr Bld: 6.9 % — ABNORMAL HIGH (ref 4.6–6.5)

## 2023-12-31 NOTE — Assessment & Plan Note (Addendum)
 Continue focus on blood pressure and glucose control, adequate hydration, and avoidance of nephrotoxic medications. Continue ARB and SGLT2i. Has appointment in Jan. with Dr. Marlee.

## 2023-12-31 NOTE — Assessment & Plan Note (Addendum)
 Stable and has been at goal. I will recheck her A1c today. Continue empagliflozin  10 mg daily.

## 2023-12-31 NOTE — Assessment & Plan Note (Addendum)
 Has been evaluated by Dr. Mona (cardiology) for optimization of lipid management. Continue rosuvastatin  40 mg daily, ezetimibe  10 mg daily, and evolocumab  (Repatha ) 140 mg every 14 days.

## 2023-12-31 NOTE — Assessment & Plan Note (Signed)
 Discussed the findings of reversible ischemia on the cardiac PET scan. I anticipate cardiology may recommend a cardiac cath with possible stent placement. I do feel the potential benefits to Becky Turner will outweigh the risks.

## 2023-12-31 NOTE — Progress Notes (Signed)
 [name] [address] [place] [address] Dept: [phone] Dept Fax: [phone]  Chronic Care Office Visit  Subjective:    Patient ID: Becky Turner, female    DOB: 11-09-1945, 78 y.o..   MRN: [id number]  Chief Complaint  Patient presents with   Follow-up    3 month follow up  DUE for Foot Exam and Zoster Vaccine and AMV    History of Present Illness:  Patient is in today for reassessment of chronic medical conditions. Interpretation provided by Ms. Perlman's daughter, Veva, at her insistence.  [name] has a history of hypertension. She is managed on losartan  50 mg daily, which was decreased from 100 mg by Dr. Mona (cardiology). On 9/19, her daughter messaged to report some ankle swelling attributed to amlodipine , so we stopped this. Her nephrologist (Dr. Marlee) recommends we maintain her systolic BP < 140.   [name] has a history of hyperlipidemia. She is currently on ezetimibe  10 mg daily, rosuvastatin  40 mg daily and evolocumab  (Repatha ) 140 mg every 14 days. She recently underwent evaluation by cardiology related to coronary artery calcifications. Her daughter, Veva, was unsure of what to make of the report.   [name] has Type 2 diabetes. She is on empagliflozin  10 mg daily.   [name] has a stage 4 CKD. She has evidence of secondary hyperparathyroidism and normocytic anemia. She is followed by Dr. Marlee (nephrology). She is currently on both an ARB and an SGLT2i.  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   Choledocholithiasis 07/19/2020   Chronic kidney disease, stage 4 (severe) (HCC) 07/19/2020   Corneal edema of right eye 09/22/2019   Hyperlipidemia, familial, high LDL 01/30/2019   Essential hypertension 09/10/2015   Dizziness and giddiness 09/10/2015   Insomnia 09/10/2015   Type 2 diabetes mellitus with renal complication (HCC) 09/10/2015   Disequilibrium 09/10/2015   Past Surgical History:  Procedure Laterality Date   BILIARY DILATION  10/09/2020   Procedure: BILIARY DILATION;  Surgeon: Wilhelmenia Aloha Raddle., MD;  Location: [facility];  Service: Gastroenterology;;   BILIARY STENT PLACEMENT  07/20/2020   Procedure: BILIARY STENT PLACEMENT;  Surgeon: Aneita Gwendlyn DASEN, MD;  Location: [facility] ENDOSCOPY;  Service: Endoscopy;;   BILIARY STENT PLACEMENT  07/23/2020   Procedure: BILIARY STENT PLACEMENT;  Surgeon: Wilhelmenia Aloha Raddle., MD;  Location: [facility] ENDOSCOPY;  Service: Gastroenterology;;   BIOPSY  07/23/2020   Procedure: BIOPSY;  Surgeon: Wilhelmenia Aloha Raddle., MD;  Location: [facility] ENDOSCOPY;  Service: Gastroenterology;;   BIOPSY  10/09/2020   Procedure: BIOPSY;  Surgeon: Wilhelmenia Aloha Raddle., MD;  Location: [facility] ENDOSCOPY;  Service: Gastroenterology;;   CATARACT EXTRACTION, BILATERAL  2019   CHOLECYSTECTOMY N/A 07/24/2020   Procedure: LAPAROSCOPIC CHOLECYSTECTOMY;  Surgeon: Sebastian Moles, MD;  Location: [facility] OR;  Service: General;  Laterality: N/A;   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   ENDOSCOPIC RETROGRADE CHOLANGIOPANCREATOGRAPHY (ERCP) WITH PROPOFOL  N/A 07/23/2020   Procedure: ENDOSCOPIC RETROGRADE CHOLANGIOPANCREATOGRAPHY (ERCP) WITH PROPOFOL ;  Surgeon: Wilhelmenia Aloha Raddle., MD;  Location:  [facility] ENDOSCOPY;  Service: Gastroenterology;  Laterality: N/A;   ENDOSCOPIC RETROGRADE CHOLANGIOPANCREATOGRAPHY (ERCP) WITH PROPOFOL  N/A 10/09/2020   Procedure: ENDOSCOPIC RETROGRADE CHOLANGIOPANCREATOGRAPHY  (ERCP) WITH PROPOFOL ;  Surgeon: Wilhelmenia Aloha Raddle., MD;  Location: [facility]OSCOPY;  Service: Gastroenterology;  Laterality: N/A;   ERCP N/A 07/20/2020   Procedure: ENDOSCOPIC RETROGRADE CHOLANGIOPANCREATOGRAPHY (ERCP);  Surgeon: Aneita Gwendlyn DASEN, MD;  Location: [facility] ENDOSCOPY;  Service: Endoscopy;  Laterality: N/A;   HEMOSTASIS CONTROL  07/20/2020   Procedure: HEMOSTASIS CONTROL;  Surgeon: Aneita Gwendlyn DASEN, MD;  Location: [facility] ENDOSCOPY;  Service: Endoscopy;;   REMOVAL OF STONES  07/20/2020   Procedure: REMOVAL OF STONES;  Surgeon: Aneita Gwendlyn DASEN, MD;  Location: [facility] ENDOSCOPY;  Service: Endoscopy;;   REMOVAL OF STONES  07/23/2020   Procedure: REMOVAL OF STONES;  Surgeon: Wilhelmenia Aloha Raddle., MD;  Location: [facility] ENDOSCOPY;  Service: Gastroenterology;;   REMOVAL OF STONES  10/09/2020   Procedure: REMOVAL OF STONES;  Surgeon: Wilhelmenia Aloha Raddle., MD;  Location: [facility];  Service: Gastroenterology;;   ANNETT  07/20/2020   Procedure: ANNETT;  Surgeon: Aneita Gwendlyn DASEN, MD;  Location: [facility] ENDOSCOPY;  Service: Endoscopy;;   SPYGLASS CHOLANGIOSCOPY N/A 07/23/2020   Procedure: DEBHOJDD CHOLANGIOSCOPY;  Surgeon: Wilhelmenia Aloha Raddle., MD;  Location: [facility] ENDOSCOPY;  Service: Gastroenterology;  Laterality: N/A;   SPYGLASS CHOLANGIOSCOPY N/A 10/09/2020   Procedure: SPYGLASS CHOLANGIOSCOPY;  Surgeon: Wilhelmenia Aloha Raddle., MD;  Location: [facility] ENDOSCOPY;  Service: Gastroenterology;  Laterality: N/A;   SPYGLASS LITHOTRIPSY N/A 07/23/2020   Procedure: DEBHOJDD LITHOTRIPSY;  Surgeon: Wilhelmenia Aloha Raddle., MD;  Location: [facility] ENDOSCOPY;  Service: Gastroenterology;  Laterality: N/A;   STENT REMOVAL  07/23/2020   Procedure: STENT REMOVAL;  Surgeon: Wilhelmenia Aloha Raddle., MD;  Location: [facility] ENDOSCOPY;  Service: Gastroenterology;;   CLEDA REMOVAL  10/09/2020   Procedure: STENT REMOVAL;  Surgeon: Wilhelmenia Aloha Raddle., MD;  Location: [facility] ENDOSCOPY;  Service: Gastroenterology;;   Family  History  Problem Relation Age of Onset   Hypertension Mother    Hypertension Father    Diabetes Sister    Gallstones Other    Hyperlipidemia Daughter    Other Neg Hx    Colon cancer Neg Hx    Esophageal cancer Neg Hx    Inflammatory bowel disease Neg Hx    Liver disease Neg Hx    Pancreatic cancer Neg Hx    Rectal cancer Neg Hx    Stomach cancer Neg Hx    Outpatient Medications Prior to Visit  Medication Sig Dispense Refill   amLODipine  (NORVASC ) 5 MG tablet Take 1 tablet (5 mg total) by mouth daily. 90 tablet 3   Cholecalciferol (VITAMIN D3) 50 MCG (2000 UT) CAPS Take 2,000 Units by mouth daily.     empagliflozin  (JARDIANCE ) 10 MG TABS tablet Take 1 tablet (10 mg total) by mouth daily before breakfast. 90 tablet 3   Evolocumab  (REPATHA  SURECLICK) 140 MG/ML SOAJ Inject 140 mg into the skin every 14 (fourteen) days. 2 mL 6   ezetimibe  (ZETIA ) 10 MG tablet Take 1 tablet (10 mg total) by mouth daily. 90 tablet 3   losartan  (COZAAR ) 50 MG tablet Take 1 tablet (50 mg total) by mouth daily. 90 tablet 1   methylPREDNISolone  (MEDROL  DOSEPAK) 4 MG TBPK tablet Take per package instructions. (Patient not taking: Reported on 10/29/2023) 21 tablet 0   metoprolol  tartrate (LOPRESSOR ) 100 MG tablet Take 1 tablet (100 mg total) by mouth as directed. Take TWO HOURS before CT test 1 tablet 0   prednisoLONE  acetate (  PRED FORTE ) 1 % ophthalmic suspension Place 1 drop into the right eye daily.     rosuvastatin  (CRESTOR ) 40 MG tablet Take 1 tablet (40 mg total) by mouth daily. 90 tablet 3   No facility-administered medications prior to visit.   Allergies  Allergen Reactions   Aspirin Nausea And Vomiting   Amoxicillin  Rash   Clarithromycin  Rash     Objective:   Today's Vitals   12/31/23 1320  BP: 130/60  Pulse: 66  Temp: 97.7 F (36.5 C)  TempSrc: Oral  SpO2: 99%  Weight: 117 lb 12.8 oz (53.4 kg)  Height: 5' 1 (1.549 m)   Body mass index is 22.26 kg/m.   General: Well developed, well  nourished. No acute distress. Feet- Skin intact. No sign of maceration between toes. Nails are normal. Dorsalis pedis and posterior tibial artery pulses   are normal. 5.07 monofilament testing normal. Psych: Alert and oriented. Normal mood and affect.  Health Maintenance Due  Topic Date Due   Zoster Vaccines- Shingrix (1 of 2) Never done   Medicare Annual Wellness (AWV)  03/14/2023   Influenza Vaccine  09/10/2023   COVID-19 Vaccine (5 - 2025-26 season) 10/11/2023   FOOT EXAM  10/21/2023   Imaging NM PET CT CARDIAC PERFUSION MULTI W/ABSOLUTE BLOODFLOW Result Date: 12/29/2023   Reversible perfusion defect in apical to mid anterior wall, suggesting LAD territory ischemia.  No high risk findings such as TID or drop in EF with stress.  However myocardial blood flow reserve is reduced globally (1.49) and in each coronary distribution.  This is driven by high rest flows, as absolute stress flows are normal.  CT shows overall moderate coronary calcifications but significant left main calcifications.  Flow reserve findings could be due to microvascular disease, but given anterior perfusion defect and considering significant calcifications in left main, recommend cardiac catheterization to evaluate for obstructive CAD    LV perfusion is abnormal. There is evidence of ischemia. Defect 1: There is a small defect with mild reduction in uptake present in the apical to mid anterior location(s) that is reversible. There is normal wall motion in the defect area. Consistent with ischemia. The defect is consistent with abnormal perfusion in the LAD territory.    Rest left ventricular function is normal. Rest EF: 67%. Stress left ventricular function is normal. Stress EF: 73%. End diastolic cavity size is normal. End systolic cavity size is normal.    Myocardial blood flow was computed to be 1.17ml/g/min at rest and 2.31ml/g/min at stress. Global myocardial blood flow reserve was 1.50 and was abnormal.    Coronary  calcium  was present on the attenuation correction CT images. Moderate coronary calcifications were present. Coronary calcifications were present in the left anterior descending artery, left circumflex artery and right coronary artery distribution(s).    Findings are consistent with ischemia. The study is high risk.   NM PET CT CARDIAC PERFUSION RAD READ ONLY Result Date: 12/29/2023 IMPRESSION:  1. No acute extracardiac abnormality.  2. Aortic atherosclerosis.   CT CARDIAC SCORING (SELF PAY ONLY) Result Date: 11/25/2023 IMPRESSION:  1. Coronary calcium  score of 329. This was 88th percentile for age-, race-, and sex-matched controls (MESA). Heavy calcification of the left main coronary is noted, as well as the 3 main coronary arteries.  2. Aortic atherosclerosis.     Assessment & Plan:   Problem List Items Addressed This Visit       Cardiovascular and Mediastinum   Coronary artery disease   Discussed the findings  of reversible ischemia on the cardiac PET scan. I anticipate cardiology may recommend a cardiac cath with possible stent placement. I do feel the potential benefits to Ms. Meng will outweigh the risks.      Essential hypertension   BP is in adequate control. Continue losartan  50 mg daily.         Endocrine   Type 2 diabetes mellitus with renal complication (HCC) - Primary   Stable and has been at goal. I will recheck her A1c today. Continue empagliflozin  10 mg daily.       Relevant Orders   Glucose, random   Hemoglobin A1c     Genitourinary   Chronic kidney disease, stage 4 (severe) (HCC)   Continue focus on blood pressure and glucose control, adequate hydration, and avoidance of nephrotoxic medications. Continue ARB and SGLT2i. Has appointment in Jan. with Dr. Marlee.        Other   Hyperlipidemia, familial, high LDL   Has been evaluated by Dr. Mona (cardiology) for optimization of lipid management. Continue rosuvastatin  40 mg daily, ezetimibe  10 mg daily, and  evolocumab  (Repatha ) 140 mg every 14 days.       Other Visit Diagnoses       Long term current use of oral hypoglycemic drug           Return in about 3 months (around 04/01/2024) for Reassessment.   Garnette CHRISTELLA Simpler, MD  I,Emily Lagle,acting as a scribe for Garnette CHRISTELLA Simpler, MD.,have documented all relevant documentation on the behalf of Garnette CHRISTELLA Simpler, MD.  I, Garnette CHRISTELLA Simpler, MD, have reviewed all documentation for this visit. The documentation on 12/31/2023 for the exam, diagnosis, procedures, and orders are all accurate and complete.

## 2023-12-31 NOTE — Assessment & Plan Note (Addendum)
 BP is in adequate control. Continue losartan  50 mg daily.

## 2024-01-03 ENCOUNTER — Encounter (HOSPITAL_BASED_OUTPATIENT_CLINIC_OR_DEPARTMENT_OTHER): Payer: Self-pay

## 2024-01-03 NOTE — Telephone Encounter (Signed)
 Sent message in another encounter. Needs OV per MD - last visit 10/29/23. Discussed procedure/consent.   Offered 01/04/24 at [facility]

## 2024-01-04 ENCOUNTER — Encounter (HOSPITAL_BASED_OUTPATIENT_CLINIC_OR_DEPARTMENT_OTHER): Payer: Self-pay | Admitting: Internal Medicine

## 2024-01-04 ENCOUNTER — Ambulatory Visit (INDEPENDENT_AMBULATORY_CARE_PROVIDER_SITE_OTHER): Admitting: Internal Medicine

## 2024-01-04 VITALS — BP 140/60 | HR 62 | Ht 61.0 in | Wt 120.1 lb

## 2024-01-04 DIAGNOSIS — R931 Abnormal findings on diagnostic imaging of heart and coronary circulation: Secondary | ICD-10-CM | POA: Diagnosis not present

## 2024-01-04 DIAGNOSIS — R5383 Other fatigue: Secondary | ICD-10-CM

## 2024-01-04 DIAGNOSIS — I2510 Atherosclerotic heart disease of native coronary artery without angina pectoris: Secondary | ICD-10-CM | POA: Diagnosis not present

## 2024-01-04 DIAGNOSIS — E7849 Other hyperlipidemia: Secondary | ICD-10-CM

## 2024-01-04 DIAGNOSIS — Z01812 Encounter for preprocedural laboratory examination: Secondary | ICD-10-CM

## 2024-01-04 NOTE — Progress Notes (Signed)
 LIPID CLINIC CONSULT NOTE  Chief Complaint:  Follow-up stress test  Primary Care Physician: Thedora Garnette HERO, MD  Primary Cardiologist:  None  HPI:  Lorelei Heikkila is a 78 y.o. female who is being seen today for the evaluation of dyslipidemia at the request of Rudd, Garnette HERO, MD. This is a pleasant 78 year old Vietnamese female who is accompanied by her daughter today and she is translating for her.  She is a former patient of Dr. Ladona but has not been seen in a number of years.  She has a history of dyslipidemia, type 2 diabetes, chronic kidney disease, and hypertension.  Has previously been on ezetimibe  and rosuvastatin  40 mg daily in addition to Repatha .  She reports recent noncompliance with the medications.  Lipid testing in May 2025 showed total cholesterol 504, triglycerides 162, HDL 87 and LDL 384 (up from 109 previously).  Her daughter noted that she had been noncompliant with the medications.  01/04/2024  Mrs. Wilmeth returns today for follow-up.  She underwent a screening calcium  score based on her high cholesterol which showed a calcium  score 329, 88th percentile for age and sex matched controls.  There was heavy calcification of all 3 coronaries including the left main coronary in which the calcium  score was 245.  Because of chronic kidney disease she could not undergo CT coronary CT.  She ultimately underwent a PET scan in November which showed a reversible perfusion defect in the anterior wall suggestive of anterior ischemia.  However there was global reduction in myocardial blood flow reserve which could suggest multivessel coronary disease or significant left main disease.  The study was considered high risk.  LVEF was 67%.  Based on these findings cardiac catheterization was recommended.  She does report that she has been having some shortness of breath and fatigue with minimal exertion.  Today she is accompanied by her daughter in the office to discuss the risks and benefits of  cardiac catheterization.  Her daughter is primarily translating for her.  They have discussed the procedure at home and why I went over the risks, benefits and alternatives as well as description of the procedure with them today.  She understands these risks and is agreeable to proceed.  She understands she may be at higher risk of kidney dysfunction with contrast dye.  Based on the stress test findings, however it is possible that she might require cardiac surgery.   Past Medical History:  Diagnosis Date   Anemia    Diabetes mellitus without complication (HCC)    Hyperlipidemia, familial, high LDL 01/30/2019   Hypertension    Prediabetes     Past Surgical History:  Procedure Laterality Date   BILIARY DILATION  10/09/2020   Procedure: BILIARY DILATION;  Surgeon: Wilhelmenia Aloha Raddle., MD;  Location: [facility];  Service: Gastroenterology;;   BILIARY STENT PLACEMENT  07/20/2020   Procedure: BILIARY STENT PLACEMENT;  Surgeon: Aneita Gwendlyn DASEN, MD;  Location: [facility] ENDOSCOPY;  Service: Endoscopy;;   BILIARY STENT PLACEMENT  07/23/2020   Procedure: BILIARY STENT PLACEMENT;  Surgeon: Wilhelmenia Aloha Raddle., MD;  Location: [facility] ENDOSCOPY;  Service: Gastroenterology;;   BIOPSY  07/23/2020   Procedure: BIOPSY;  Surgeon: Wilhelmenia Aloha Raddle., MD;  Location[facility] ENDOSCOPY;  Service: Gastroenterology;;   BIOPSY  10/09/2020   Procedure: BIOPSY;  Surgeon: Wilhelmenia Aloha Raddle., MD;  Location: [facility];  Service: Gastroenterology;;   CATARACT EXTRACTION, BILATERAL  2019   CHOLECYSTECTOMY N/A 07/24/2020   Procedure: LAPAROSCOPIC CHOLECYSTECTOMY;  Surgeon: Sebastian,  Dann, MD;  Location[facility] OR;  Service: General;  Laterality: N/A;   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   ENDOSCOPIC RETROGRADE CHOLANGIOPANCREATOGRAPHY (ERCP) WITH PROPOFOL  N/A 07/23/2020   Procedure: ENDOSCOPIC RETROGRADE CHOLANGIOPANCREATOGRAPHY (ERCP) WITH PROPOFOL ;  Surgeon: Wilhelmenia Aloha Raddle., MD;  Location: [facility]  ENDOSCOPY;  Service: Gastroenterology;  Laterality: N/A;   ENDOSCOPIC RETROGRADE CHOLANGIOPANCREATOGRAPHY (ERCP) WITH PROPOFOL  N/A 10/09/2020   Procedure: ENDOSCOPIC RETROGRADE CHOLANGIOPANCREATOGRAPHY (ERCP) WITH PROPOFOL ;  Surgeon: Wilhelmenia Aloha Raddle., MD;  Location: [facility] ENDOSCOPY;  Service: Gastroenterology;  Laterality: N/A;   ERCP N/A 07/20/2020   Procedure: ENDOSCOPIC RETROGRADE CHOLANGIOPANCREATOGRAPHY (ERCP);  Surgeon: Aneita Gwendlyn DASEN, MD;  Location: [facility] ENDOSCOPY;  Service: Endoscopy;  Laterality: N/A;   HEMOSTASIS CONTROL  07/20/2020   Procedure: HEMOSTASIS CONTROL;  Surgeon: Aneita Gwendlyn DASEN, MD;  Location: [facility] ENDOSCOPY;  Service: Endoscopy;;   REMOVAL OF STONES  07/20/2020   Procedure: REMOVAL OF STONES;  Surgeon: Aneita Gwendlyn DASEN, MD;  Location: [facility] ENDOSCOPY;  Service: Endoscopy;;   REMOVAL OF STONES  07/23/2020   Procedure: REMOVAL OF STONES;  Surgeon: Wilhelmenia Aloha Raddle., MD;  Location: [facility] ENDOSCOPY;  Service: Gastroenterology;;   REMOVAL OF STONES  10/09/2020   Procedure: REMOVAL OF STONES;  Surgeon: Wilhelmenia Aloha Raddle., MD;  Location: [facility];  Service: Gastroenterology;;   ANNETT  07/20/2020   Procedure: ANNETT;  Surgeon: Aneita Gwendlyn DASEN, MD;  Location: [facility] ENDOSCOPY;  Service: Endoscopy;;   SPYGLASS CHOLANGIOSCOPY N/A 07/23/2020   Procedure: DEBHOJDD CHOLANGIOSCOPY;  Surgeon: Wilhelmenia Aloha Raddle., MD;  Location: [facility] ENDOSCOPY;  Service: Gastroenterology;  Laterality: N/A;   SPYGLASS CHOLANGIOSCOPY N/A 10/09/2020   Procedure: SPYGLASS CHOLANGIOSCOPY;  Surgeon: Wilhelmenia Aloha Raddle., MD;  Location: [facility] ENDOSCOPY;  Service: Gastroenterology;  Laterality: N/A;   SPYGLASS LITHOTRIPSY N/A 07/23/2020   Procedure: DEBHOJDD LITHOTRIPSY;  Surgeon: Wilhelmenia Aloha Raddle., MD;  Location: [facility] ENDOSCOPY;  Service: Gastroenterology;  Laterality: N/A;   STENT REMOVAL  07/23/2020   Procedure: STENT REMOVAL;  Surgeon: Wilhelmenia Aloha Raddle., MD;  Location:  [facility] ENDOSCOPY;  Service: Gastroenterology;;   CLEDA REMOVAL  10/09/2020   Procedure: STENT REMOVAL;  Surgeon: Wilhelmenia Aloha Raddle., MD;  Location: [facility] ENDOSCOPY;  Service: Gastroenterology;;    FAMHx:  Family History  Problem Relation Age of Onset   Hypertension Mother    Hypertension Father    Diabetes Sister    Gallstones Other    Hyperlipidemia Daughter    Other Neg Hx    Colon cancer Neg Hx    Esophageal cancer Neg Hx    Inflammatory bowel disease Neg Hx    Liver disease Neg Hx    Pancreatic cancer Neg Hx    Rectal cancer Neg Hx    Stomach cancer Neg Hx     SOCHx:   reports that she has never smoked. She has never used smokeless tobacco. She reports that she does not drink alcohol and does not use drugs.  ALLERGIES:  Allergies  Allergen Reactions   Aspirin Nausea And Vomiting   Amoxicillin  Rash   Clarithromycin  Rash    ROS: Pertinent items noted in HPI and remainder of comprehensive ROS otherwise negative.  HOME MEDS: Current Outpatient Medications on File Prior to Visit  Medication Sig Dispense Refill   Cholecalciferol (VITAMIN D3) 50 MCG (2000 UT) CAPS Take 2,000 Units by mouth daily.     empagliflozin  (JARDIANCE ) 10 MG TABS tablet Take 1 tablet (10 mg total) by mouth daily before breakfast. 90 tablet 3   Evolocumab  (REPATHA  SURECLICK) 140 MG/ML SOAJ  Inject 140 mg into the skin every 14 (fourteen) days. 2 mL 6   ezetimibe  (ZETIA ) 10 MG tablet Take 1 tablet (10 mg total) by mouth daily. 90 tablet 3   losartan  (COZAAR ) 50 MG tablet Take 1 tablet (50 mg total) by mouth daily. 90 tablet 1   rosuvastatin  (CRESTOR ) 40 MG tablet Take 1 tablet (40 mg total) by mouth daily. 90 tablet 3   No current facility-administered medications on file prior to visit.    LABS/IMAGING: No results found for this or any previous visit (from the past 48 hours). No results found.  LIPID PANEL:    Component Value Date/Time   CHOL 504 (H) 06/30/2023 1040   CHOL 156 01/29/2020 0823    TRIG 162.0 (H) 06/30/2023 1040   HDL 87.90 06/30/2023 1040   HDL 65 01/29/2020 0823   CHOLHDL 6 06/30/2023 1040   VLDL 32.4 06/30/2023 1040   LDLCALC 384 (H) 06/30/2023 1040   LDLCALC 62 01/29/2020 0823    Lipoprotein (a)  Date/Time Value Ref Range Status  12/03/2023 12:32 PM 95.9 (H) <75.0 nmol/L Final    Comment:    Note:  Values greater than or equal to 75.0 nmol/L may        indicate an independent risk factor for CHD,        but must be evaluated with caution when applied        to non-Caucasian populations due to the        influence of genetic factors on Lp(a) across        ethnicities.      WEIGHTS: Wt Readings from Last 3 Encounters:  01/04/24 120 lb 1.6 oz (54.5 kg)  12/31/23 117 lb 12.8 oz (53.4 kg)  10/29/23 119 lb (54 kg)    VITALS: BP (!) 140/60   Pulse 62   Ht 5' 1 (1.549 m)   Wt 120 lb 1.6 oz (54.5 kg)   SpO2 95%   BMI 22.69 kg/m   EXAM: Deferred  EKG: EKG Interpretation Date/Time:  Tuesday January 04 2024 11:03:15 EST Ventricular Rate:  58 PR Interval:  146 QRS Duration:  86 QT Interval:  408 QTC Calculation: 400 R Axis:   37  Text Interpretation: Sinus bradycardia with sinus arrhythmia Nonspecific T wave abnormality When compared with ECG of 19-Jul-2020 14:30, T wave inversion no longer evident in Inferior leads Confirmed by Mona Kent [id number]) on 01/04/2024 11:07:14 AM    ASSESSMENT: Progressive fatigue and dyspnea on exertion Abnormal cardiac PET suggesting reversible anterior ischemia, high risk CAC score 329, 88 percentile with the left main and three-vessel coronary calcification Probable familial hyperlipidemia, LDL greater than 190 Aortic atherosclerosis Medication noncompliance Prediabetes, goal LDL less than 70 Hypertension  PLAN: 1.   Ms. Rake has had some progressive fatigue and dyspnea on exertion.  Her calcium  score showed heavy left main calcification and three-vessel disease and a PET scan showed reversible  anterior ischemia with a decreased myocardial blood flow reserve.  This could suggest significant left main or multivessel coronary ischemia.  Based on these findings and some of her progressive symptoms I have advised cardiac catheterization.  I discussed the risk, benefits and alternatives with her and her daughter who is translating for her today and she is agreeable to proceed.  We will hold her losartan  today prior to her procedure and her Jardiance  for several days to hopefully improve creatinine.  We could also admit her for prehydration in the morning and try to schedule  the procedure in the afternoon.  Based on the holiday this week I would suggest that this could be next week.  Informed Consent   Shared Decision Making/Informed Consent The risks [stroke (1 in 1000), death (1 in 1000), kidney failure [usually temporary] (1 in 500), bleeding (1 in 200), allergic reaction [possibly serious] (1 in 200)], benefits (diagnostic support and management of coronary artery disease) and alternatives of a cardiac catheterization were discussed in detail with Ms. Rodier and she is willing to proceed.    Plan follow-up with me afterward.  Vinie KYM Maxcy, MD, [facility], FNLA, FACP  [place]  [facility]  Medical Director of the Advanced Lipid Disorders &  Cardiovascular Risk Reduction Clinic Diplomate of the American Board of Clinical Lipidology Attending Cardiologist  Direct Dial: [phone]  Fax: [phone]  Website:  [email or web address] Vinie BROCKS Damel Querry 01/04/2024, 11:07 AM

## 2024-01-04 NOTE — Patient Instructions (Signed)
 Medication Instructions:  No changes today *If you need a refill on your cardiac medications before your next appointment, please call your pharmacy*  Lab Work: Today: bmet, cbc  Testing/Procedures: Your physician has requested that you have a cardiac catheterization. Cardiac catheterization is used to diagnose and/or treat various heart conditions. Doctors may recommend this procedure for a number of different reasons. The most common reason is to evaluate chest pain. Chest pain can be a symptom of coronary artery disease (CAD), and cardiac catheterization can show whether plaque is narrowing or blocking your heart's arteries. This procedure is also used to evaluate the valves, as well as measure the blood flow and oxygen levels in different parts of your heart. For further information please visit [email or web address] Please follow instruction sheet, as given.   Follow-Up: At [facility], you and your health needs are our priority.  As part of our continuing mission to provide you with exceptional heart care, our providers are all part of one team.  This team includes your primary Cardiologist (physician) and Advanced Practice Providers or APPs (Physician Assistants and Nurse Practitioners) who all work together to provide you with the care you need, when you need it.  Your next appointment:   4 week(s)  Provider:   Chad Hilty, MD  Cardiac/Peripheral Catheterization   You are scheduled for a Cardiac Catheterization on Monday, December 1 with Dr. Lonni End.  1. Please arrive at the [facility] (Main Entrance A) at [facility]: [address] [place], [place] [address] at 6:00 AM (This time is 4 1/2 hour(s) before your procedure to ensure your preparation).   Free valet parking service is available. You will check in at ADMITTING. The support person will be asked to wait in the waiting room.  It is OK to have someone drop you off and come back when you are ready to  be discharged.        Special note: Every effort is made to have your procedure done on time. Please understand that emergencies sometimes delay scheduled procedures.  2. Diet: Nothing to eat after midnight.  3. Hydration: You will arrive at the hospital at 6:00 am - to allow time for IV hydration before the procedure.  You may drink water up until the time you arrive at the hospital.  List of approved liquids water, clear juice, clear tea, black coffee, fruit juices, non-citric and without pulp, carbonated beverages, Gatorade, Kool -Aid, plain Jello-O and plain ice popsicles.  4. Labs: You will need to have blood drawn today.  You do not need to be fasting.  5. Medication instructions in preparation for your procedure:   Contrast Allergy: No  Do not take Jardiance  after this Thursday 01/06/24 (HOLD 3 days prior) Do not take losartan  after Saturday 01/08/24 (HOLD 1 day prior)  On the morning of your procedure, take Aspirin 81 mg and any morning medicines NOT listed above.  You may use sips of water.  6. Plan to go home the same day, you will only stay overnight if medically necessary. 7. You MUST have a responsible adult to drive you home. 8. An adult MUST be with you the first 24 hours after you arrive home. 9. Bring a current list of your medications, and the last time and date medication taken. 10. Bring ID and current insurance cards. 11.Please wear clothes that are easy to get on and off and wear slip-on shoes.  Thank you for allowing us  to care for you!   --  [facility]

## 2024-01-04 NOTE — H&P (View-Only) (Signed)
 LIPID CLINIC CONSULT NOTE  Chief Complaint:  Follow-up stress test  Primary Care Physician: Thedora Garnette HERO, MD  Primary Cardiologist:  None  HPI:  Becky Turner is a 78 y.o. female who is being seen today for the evaluation of dyslipidemia at the request of Rudd, Garnette HERO, MD. This is a pleasant 78 year old Vietnamese female who is accompanied by her daughter today and she is translating for her.  She is a former patient of Dr. Ladona but has not been seen in a number of years.  She has a history of dyslipidemia, type 2 diabetes, chronic kidney disease, and hypertension.  Has previously been on ezetimibe  and rosuvastatin  40 mg daily in addition to Repatha .  She reports recent noncompliance with the medications.  Lipid testing in May 2025 showed total cholesterol 504, triglycerides 162, HDL 87 and LDL 384 (up from 109 previously).  Her daughter noted that she had been noncompliant with the medications.  01/04/2024  Becky Turner returns today for follow-up.  She underwent a screening calcium  score based on her high cholesterol which showed a calcium  score 329, 88th percentile for age and sex matched controls.  There was heavy calcification of all 3 coronaries including the left main coronary in which the calcium  score was 245.  Because of chronic kidney disease she could not undergo CT coronary CT.  She ultimately underwent a PET scan in November which showed a reversible perfusion defect in the anterior wall suggestive of anterior ischemia.  However there was global reduction in myocardial blood flow reserve which could suggest multivessel coronary disease or significant left main disease.  The study was considered high risk.  LVEF was 67%.  Based on these findings cardiac catheterization was recommended.  She does report that she has been having some shortness of breath and fatigue with minimal exertion.  Today she is accompanied by her daughter in the office to discuss the risks and benefits of  cardiac catheterization.  Her daughter is primarily translating for her.  They have discussed the procedure at home and why I went over the risks, benefits and alternatives as well as description of the procedure with them today.  She understands these risks and is agreeable to proceed.  She understands she may be at higher risk of kidney dysfunction with contrast dye.  Based on the stress test findings, however it is possible that she might require cardiac surgery.   Past Medical History:  Diagnosis Date   Anemia    Diabetes mellitus without complication (HCC)    Hyperlipidemia, familial, high LDL 01/30/2019   Hypertension    Prediabetes     Past Surgical History:  Procedure Laterality Date   BILIARY DILATION  10/09/2020   Procedure: BILIARY DILATION;  Surgeon: Wilhelmenia Aloha Raddle., MD;  Location: [facility];  Service: Gastroenterology;;   BILIARY STENT PLACEMENT  07/20/2020   Procedure: BILIARY STENT PLACEMENT;  Surgeon: Aneita Gwendlyn DASEN, MD;  Location: [facility] ENDOSCOPY;  Service: Endoscopy;;   BILIARY STENT PLACEMENT  07/23/2020   Procedure: BILIARY STENT PLACEMENT;  Surgeon: Wilhelmenia Aloha Raddle., MD;  Location: [facility] ENDOSCOPY;  Service: Gastroenterology;;   BIOPSY  07/23/2020   Procedure: BIOPSY;  Surgeon: Wilhelmenia Aloha Raddle., MD;  Location: [facility] ENDOSCOPY;  Service: Gastroenterology;;   BIOPSY  10/09/2020   Procedure: BIOPSY;  Surgeon: Wilhelmenia Aloha Raddle., MD;  Location: [facility];  Service: Gastroenterology;;   CATARACT EXTRACTION, BILATERAL  2019   CHOLECYSTECTOMY N/A 07/24/2020   Procedure: LAPAROSCOPIC CHOLECYSTECTOMY;  Surgeon: Sebastian,  Dann, MD;  Location[facility] OR;  Service: General;  Laterality: N/A;   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   ENDOSCOPIC RETROGRADE CHOLANGIOPANCREATOGRAPHY (ERCP) WITH PROPOFOL  N/A 07/23/2020   Procedure: ENDOSCOPIC RETROGRADE CHOLANGIOPANCREATOGRAPHY (ERCP) WITH PROPOFOL ;  Surgeon: Wilhelmenia Aloha Raddle., MD;  Location: [facility]  ENDOSCOPY;  Service: Gastroenterology;  Laterality: N/A;   ENDOSCOPIC RETROGRADE CHOLANGIOPANCREATOGRAPHY (ERCP) WITH PROPOFOL  N/A 10/09/2020   Procedure: ENDOSCOPIC RETROGRADE CHOLANGIOPANCREATOGRAPHY (ERCP) WITH PROPOFOL ;  Surgeon: Wilhelmenia Aloha Raddle., MD;  Location: [facility] ENDOSCOPY;  Service: Gastroenterology;  Laterality: N/A;   ERCP N/A 07/20/2020   Procedure: ENDOSCOPIC RETROGRADE CHOLANGIOPANCREATOGRAPHY (ERCP);  Surgeon: Aneita Gwendlyn DASEN, MD;  Location: [facility] ENDOSCOPY;  Service: Endoscopy;  Laterality: N/A;   HEMOSTASIS CONTROL  07/20/2020   Procedure: HEMOSTASIS CONTROL;  Surgeon: Aneita Gwendlyn DASEN, MD;  Location: [facility] ENDOSCOPY;  Service: Endoscopy;;   REMOVAL OF STONES  07/20/2020   Procedure: REMOVAL OF STONES;  Surgeon: Aneita Gwendlyn DASEN, MD;  Location: [facility] ENDOSCOPY;  Service: Endoscopy;;   REMOVAL OF STONES  07/23/2020   Procedure: REMOVAL OF STONES;  Surgeon: Wilhelmenia Aloha Raddle., MD;  Location: [facility] ENDOSCOPY;  Service: Gastroenterology;;   REMOVAL OF STONES  10/09/2020   Procedure: REMOVAL OF STONES;  Surgeon: Wilhelmenia Aloha Raddle., MD;  Location: [facility];  Service: Gastroenterology;;   ANNETT  07/20/2020   Procedure: ANNETT;  Surgeon: Aneita Gwendlyn DASEN, MD;  Location: [facility] ENDOSCOPY;  Service: Endoscopy;;   SPYGLASS CHOLANGIOSCOPY N/A 07/23/2020   Procedure: DEBHOJDD CHOLANGIOSCOPY;  Surgeon: Wilhelmenia Aloha Raddle., MD;  Location: [facility] ENDOSCOPY;  Service: Gastroenterology;  Laterality: N/A;   SPYGLASS CHOLANGIOSCOPY N/A 10/09/2020   Procedure: SPYGLASS CHOLANGIOSCOPY;  Surgeon: Wilhelmenia Aloha Raddle., MD;  Location: [facility] ENDOSCOPY;  Service: Gastroenterology;  Laterality: N/A;   SPYGLASS LITHOTRIPSY N/A 07/23/2020   Procedure: DEBHOJDD LITHOTRIPSY;  Surgeon: Wilhelmenia Aloha Raddle., MD;  Location: [facility] ENDOSCOPY;  Service: Gastroenterology;  Laterality: N/A;   STENT REMOVAL  07/23/2020   Procedure: STENT REMOVAL;  Surgeon: Wilhelmenia Aloha Raddle., MD;  Location:  [facility] ENDOSCOPY;  Service: Gastroenterology;;   CLEDA REMOVAL  10/09/2020   Procedure: STENT REMOVAL;  Surgeon: Wilhelmenia Aloha Raddle., MD;  Location: [facility] ENDOSCOPY;  Service: Gastroenterology;;    FAMHx:  Family History  Problem Relation Age of Onset   Hypertension Mother    Hypertension Father    Diabetes Sister    Gallstones Other    Hyperlipidemia Daughter    Other Neg Hx    Colon cancer Neg Hx    Esophageal cancer Neg Hx    Inflammatory bowel disease Neg Hx    Liver disease Neg Hx    Pancreatic cancer Neg Hx    Rectal cancer Neg Hx    Stomach cancer Neg Hx     SOCHx:   reports that she has never smoked. She has never used smokeless tobacco. She reports that she does not drink alcohol and does not use drugs.  ALLERGIES:  Allergies  Allergen Reactions   Aspirin Nausea And Vomiting   Amoxicillin  Rash   Clarithromycin  Rash    ROS: Pertinent items noted in HPI and remainder of comprehensive ROS otherwise negative.  HOME MEDS: Current Outpatient Medications on File Prior to Visit  Medication Sig Dispense Refill   Cholecalciferol (VITAMIN D3) 50 MCG (2000 UT) CAPS Take 2,000 Units by mouth daily.     empagliflozin  (JARDIANCE ) 10 MG TABS tablet Take 1 tablet (10 mg total) by mouth daily before breakfast. 90 tablet 3   Evolocumab  (REPATHA  SURECLICK) 140 MG/ML SOAJ  Inject 140 mg into the skin every 14 (fourteen) days. 2 mL 6   ezetimibe  (ZETIA ) 10 MG tablet Take 1 tablet (10 mg total) by mouth daily. 90 tablet 3   losartan  (COZAAR ) 50 MG tablet Take 1 tablet (50 mg total) by mouth daily. 90 tablet 1   rosuvastatin  (CRESTOR ) 40 MG tablet Take 1 tablet (40 mg total) by mouth daily. 90 tablet 3   No current facility-administered medications on file prior to visit.    LABS/IMAGING: No results found for this or any previous visit (from the past 48 hours). No results found.  LIPID PANEL:    Component Value Date/Time   CHOL 504 (H) 06/30/2023 1040   CHOL 156 01/29/2020 0823    TRIG 162.0 (H) 06/30/2023 1040   HDL 87.90 06/30/2023 1040   HDL 65 01/29/2020 0823   CHOLHDL 6 06/30/2023 1040   VLDL 32.4 06/30/2023 1040   LDLCALC 384 (H) 06/30/2023 1040   LDLCALC 62 01/29/2020 0823    Lipoprotein (a)  Date/Time Value Ref Range Status  12/03/2023 12:32 PM 95.9 (H) <75.0 nmol/L Final    Comment:    Note:  Values greater than or equal to 75.0 nmol/L may        indicate an independent risk factor for CHD,        but must be evaluated with caution when applied        to non-Caucasian populations due to the        influence of genetic factors on Lp(a) across        ethnicities.      WEIGHTS: Wt Readings from Last 3 Encounters:  01/04/24 120 lb 1.6 oz (54.5 kg)  12/31/23 117 lb 12.8 oz (53.4 kg)  10/29/23 119 lb (54 kg)    VITALS: BP (!) 140/60   Pulse 62   Ht 5' 1 (1.549 m)   Wt 120 lb 1.6 oz (54.5 kg)   SpO2 95%   BMI 22.69 kg/m   EXAM: Deferred  EKG: EKG Interpretation Date/Time:  Tuesday January 04 2024 11:03:15 EST Ventricular Rate:  58 PR Interval:  146 QRS Duration:  86 QT Interval:  408 QTC Calculation: 400 R Axis:   37  Text Interpretation: Sinus bradycardia with sinus arrhythmia Nonspecific T wave abnormality When compared with ECG of 19-Jul-2020 14:30, T wave inversion no longer evident in Inferior leads Confirmed by Mona Kent [id number]) on 01/04/2024 11:07:14 AM    ASSESSMENT: Progressive fatigue and dyspnea on exertion Abnormal cardiac PET suggesting reversible anterior ischemia, high risk CAC score 329, 88 percentile with the left main and three-vessel coronary calcification Probable familial hyperlipidemia, LDL greater than 190 Aortic atherosclerosis Medication noncompliance Prediabetes, goal LDL less than 70 Hypertension  PLAN: 1.   Ms. Rake has had some progressive fatigue and dyspnea on exertion.  Her calcium  score showed heavy left main calcification and three-vessel disease and a PET scan showed reversible  anterior ischemia with a decreased myocardial blood flow reserve.  This could suggest significant left main or multivessel coronary ischemia.  Based on these findings and some of her progressive symptoms I have advised cardiac catheterization.  I discussed the risk, benefits and alternatives with her and her daughter who is translating for her today and she is agreeable to proceed.  We will hold her losartan  today prior to her procedure and her Jardiance  for several days to hopefully improve creatinine.  We could also admit her for prehydration in the morning and try to schedule  the procedure in the afternoon.  Based on the holiday this week I would suggest that this could be next week.  Informed Consent   Shared Decision Making/Informed Consent The risks [stroke (1 in 1000), death (1 in 1000), kidney failure [usually temporary] (1 in 500), bleeding (1 in 200), allergic reaction [possibly serious] (1 in 200)], benefits (diagnostic support and management of coronary artery disease) and alternatives of a cardiac catheterization were discussed in detail with Becky Turner and she is willing to proceed.    Plan follow-up with me afterward.  Vinie KYM Maxcy, MD, [facility], FNLA, FACP  [place]  [facility]  Medical Director of the Advanced Lipid Disorders &  Cardiovascular Risk Reduction Clinic Diplomate of the American Board of Clinical Lipidology Attending Cardiologist  Direct Dial: [phone]  Fax: [phone]  Website:  [email or web address] Vinie BROCKS Damel Querry 01/04/2024, 11:07 AM

## 2024-01-05 ENCOUNTER — Encounter (HOSPITAL_BASED_OUTPATIENT_CLINIC_OR_DEPARTMENT_OTHER): Payer: Self-pay | Admitting: *Deleted

## 2024-01-05 ENCOUNTER — Ambulatory Visit (HOSPITAL_BASED_OUTPATIENT_CLINIC_OR_DEPARTMENT_OTHER): Payer: Self-pay | Admitting: Internal Medicine

## 2024-01-05 LAB — CBC
Hematocrit: 33.3 % — ABNORMAL LOW (ref 34.0–46.6)
Hemoglobin: 10.3 g/dL — ABNORMAL LOW (ref 11.1–15.9)
MCH: 28.1 pg (ref 26.6–33.0)
MCHC: 30.9 g/dL — ABNORMAL LOW (ref 31.5–35.7)
MCV: 91 fL (ref 79–97)
Platelets: 164 x10E3/uL (ref 150–450)
RBC: 3.67 x10E6/uL — ABNORMAL LOW (ref 3.77–5.28)
RDW: 12.8 % (ref 11.7–15.4)
WBC: 5.6 x10E3/uL (ref 3.4–10.8)

## 2024-01-05 LAB — BASIC METABOLIC PANEL WITH GFR
BUN/Creatinine Ratio: 18 (ref 12–28)
BUN: 29 mg/dL — ABNORMAL HIGH (ref 8–27)
CO2: 23 mmol/L (ref 20–29)
Calcium: 8.7 mg/dL (ref 8.7–10.3)
Chloride: 105 mmol/L (ref 96–106)
Creatinine, Ser: 1.65 mg/dL — ABNORMAL HIGH (ref 0.57–1.00)
Glucose: 169 mg/dL — ABNORMAL HIGH (ref 70–99)
Potassium: 4.4 mmol/L (ref 3.5–5.2)
Sodium: 142 mmol/L (ref 134–144)
eGFR: 32 mL/min/1.73 — ABNORMAL LOW (ref >59)

## 2024-01-10 ENCOUNTER — Other Ambulatory Visit: Payer: Self-pay

## 2024-01-10 ENCOUNTER — Ambulatory Visit: Payer: Self-pay | Admitting: Internal Medicine

## 2024-01-10 ENCOUNTER — Ambulatory Visit (HOSPITAL_COMMUNITY)
Admission: RE | Admit: 2024-01-10 | Discharge: 2024-01-10 | Disposition: A | Discharge status: Home / Self Care | Attending: Internal Medicine | Admitting: Internal Medicine

## 2024-01-10 ENCOUNTER — Encounter (HOSPITAL_COMMUNITY)
Admission: RE | Disposition: A | Discharge status: Home / Self Care | Payer: Self-pay | Source: Home / Self Care | Attending: Internal Medicine

## 2024-01-10 DIAGNOSIS — Z7902 Long term (current) use of antithrombotics/antiplatelets: Secondary | ICD-10-CM | POA: Diagnosis not present

## 2024-01-10 DIAGNOSIS — I129 Hypertensive chronic kidney disease with stage 1 through stage 4 chronic kidney disease, or unspecified chronic kidney disease: Secondary | ICD-10-CM | POA: Diagnosis not present

## 2024-01-10 DIAGNOSIS — Z7984 Long term (current) use of oral hypoglycemic drugs: Secondary | ICD-10-CM | POA: Diagnosis not present

## 2024-01-10 DIAGNOSIS — R5383 Other fatigue: Secondary | ICD-10-CM | POA: Diagnosis not present

## 2024-01-10 DIAGNOSIS — E1122 Type 2 diabetes mellitus with diabetic chronic kidney disease: Secondary | ICD-10-CM | POA: Insufficient documentation

## 2024-01-10 DIAGNOSIS — Z91148 Patient's other noncompliance with medication regimen for other reason: Secondary | ICD-10-CM | POA: Insufficient documentation

## 2024-01-10 DIAGNOSIS — R0609 Other forms of dyspnea: Secondary | ICD-10-CM | POA: Insufficient documentation

## 2024-01-10 DIAGNOSIS — N1832 Chronic kidney disease, stage 3b: Secondary | ICD-10-CM | POA: Diagnosis not present

## 2024-01-10 DIAGNOSIS — I2582 Chronic total occlusion of coronary artery: Secondary | ICD-10-CM | POA: Diagnosis not present

## 2024-01-10 DIAGNOSIS — I2510 Atherosclerotic heart disease of native coronary artery without angina pectoris: Secondary | ICD-10-CM | POA: Diagnosis not present

## 2024-01-10 DIAGNOSIS — Z79899 Other long term (current) drug therapy: Secondary | ICD-10-CM | POA: Insufficient documentation

## 2024-01-10 DIAGNOSIS — I700 Atherosclerosis of aorta: Secondary | ICD-10-CM | POA: Diagnosis not present

## 2024-01-10 DIAGNOSIS — R9439 Abnormal result of other cardiovascular function study: Secondary | ICD-10-CM | POA: Diagnosis present

## 2024-01-10 DIAGNOSIS — E785 Hyperlipidemia, unspecified: Secondary | ICD-10-CM | POA: Insufficient documentation

## 2024-01-10 HISTORY — PX: LEFT HEART CATH AND CORONARY ANGIOGRAPHY: CATH118249

## 2024-01-10 LAB — GLUCOSE, CAPILLARY
Glucose-Capillary: 116 mg/dL — ABNORMAL HIGH (ref 70–99)
Glucose-Capillary: 107 mg/dL — ABNORMAL HIGH (ref 70–99)

## 2024-01-10 SURGERY — LEFT HEART CATH AND CORONARY ANGIOGRAPHY
Anesthesia: LOCAL

## 2024-01-10 MED ORDER — VERAPAMIL HCL 2.5 MG/ML IV SOLN
INTRAVENOUS | Status: AC
  Filled 2024-01-10: qty 2

## 2024-01-10 MED ORDER — MIDAZOLAM HCL 2 MG/2ML IJ SOLN
INTRAMUSCULAR | Status: AC
  Filled 2024-01-10: qty 2

## 2024-01-10 MED ORDER — VERAPAMIL HCL 2.5 MG/ML IV SOLN
INTRAVENOUS | Status: DC | PRN
  Administered 2024-01-10: 10 mL via INTRA_ARTERIAL

## 2024-01-10 MED ORDER — LABETALOL HCL 5 MG/ML IV SOLN: 10.0000 mg | INTRAVENOUS | Status: DC | PRN

## 2024-01-10 MED ORDER — LIDOCAINE HCL (PF) 1 % IJ SOLN
INTRAMUSCULAR | Status: AC
  Filled 2024-01-10: qty 30

## 2024-01-10 MED ORDER — CLOPIDOGREL BISULFATE 75 MG PO TABS: 75.0000 mg | ORAL_TABLET | Freq: Every day | ORAL | 11 refills | Status: DC

## 2024-01-10 MED ORDER — LIDOCAINE HCL (PF) 1 % IJ SOLN
INTRAMUSCULAR | Status: DC | PRN
  Administered 2024-01-10: 2 mL

## 2024-01-10 MED ORDER — ACETAMINOPHEN 325 MG PO TABS: 650.0000 mg | ORAL_TABLET | ORAL | Status: DC | PRN

## 2024-01-10 MED ORDER — FENTANYL CITRATE (PF) 100 MCG/2ML IJ SOLN
INTRAMUSCULAR | Status: DC | PRN
  Administered 2024-01-10: 25 ug via INTRAVENOUS

## 2024-01-10 MED ORDER — FENTANYL CITRATE (PF) 100 MCG/2ML IJ SOLN
INTRAMUSCULAR | Status: AC
  Filled 2024-01-10: qty 2

## 2024-01-10 MED ORDER — SODIUM CHLORIDE 0.9 % WEIGHT BASED INFUSION
3.0000 mL/kg/h | INTRAVENOUS | Status: AC
  Administered 2024-01-10: 3 mL/kg/h via INTRAVENOUS

## 2024-01-10 MED ORDER — ASPIRIN 81 MG PO CHEW
CHEWABLE_TABLET | ORAL | Status: AC
  Filled 2024-01-10: qty 1

## 2024-01-10 MED ORDER — HEPARIN SODIUM (PORCINE) 1000 UNIT/ML IJ SOLN
INTRAMUSCULAR | Status: DC | PRN
  Administered 2024-01-10: 3000 [IU] via INTRAVENOUS

## 2024-01-10 MED ORDER — HYDRALAZINE HCL 20 MG/ML IJ SOLN: 10.0000 mg | INTRAMUSCULAR | Status: DC | PRN

## 2024-01-10 MED ORDER — SODIUM CHLORIDE 0.9 % WEIGHT BASED INFUSION: 1.0000 mL/kg/h | INTRAVENOUS | Status: DC

## 2024-01-10 MED ORDER — SODIUM CHLORIDE 0.9 % IV SOLN: 250.0000 mL | INTRAVENOUS | Status: DC | PRN

## 2024-01-10 MED ORDER — SODIUM CHLORIDE 0.9% FLUSH: 3.0000 mL | INTRAVENOUS | Status: DC | PRN

## 2024-01-10 MED ORDER — HEPARIN SODIUM (PORCINE) 1000 UNIT/ML IJ SOLN
INTRAMUSCULAR | Status: AC
  Filled 2024-01-10: qty 10

## 2024-01-10 MED ORDER — ONDANSETRON HCL 4 MG/2ML IJ SOLN: 4.0000 mg | Freq: Four times a day (QID) | INTRAMUSCULAR | Status: DC | PRN

## 2024-01-10 MED ORDER — SODIUM CHLORIDE 0.9 % IV SOLN: INTRAVENOUS | Status: DC

## 2024-01-10 MED ORDER — IOHEXOL 350 MG/ML SOLN
INTRAVENOUS | Status: DC | PRN
  Administered 2024-01-10: 40 mL

## 2024-01-10 MED ORDER — SODIUM CHLORIDE 0.9% FLUSH: 3.0000 mL | Freq: Two times a day (BID) | INTRAVENOUS | Status: DC

## 2024-01-10 MED ORDER — ASPIRIN 81 MG PO CHEW
CHEWABLE_TABLET | ORAL | Status: DC | PRN
  Administered 2024-01-10: 81 mg via ORAL

## 2024-01-10 MED ORDER — METOPROLOL SUCCINATE ER 25 MG PO TB24: 25.0000 mg | ORAL_TABLET | Freq: Every day | ORAL | 11 refills | Status: DC

## 2024-01-10 MED ORDER — ASPIRIN 81 MG PO CHEW: 81.0000 mg | CHEWABLE_TABLET | ORAL | Status: DC

## 2024-01-10 MED ORDER — SODIUM CHLORIDE 0.9 % IV SOLN
INTRAVENOUS | Status: DC | PRN
  Administered 2024-01-10: 250 mL via INTRAVENOUS

## 2024-01-10 MED ORDER — HEPARIN (PORCINE) IN NACL 1000-0.9 UT/500ML-% IV SOLN
INTRAVENOUS | Status: DC | PRN
  Administered 2024-01-10 (×2): 500 mL

## 2024-01-10 MED ORDER — NITROGLYCERIN 0.4 MG SL SUBL: 0.4000 mg | SUBLINGUAL_TABLET | SUBLINGUAL | 99 refills | Status: AC | PRN

## 2024-01-10 MED ORDER — MIDAZOLAM HCL (PF) 2 MG/2ML IJ SOLN
INTRAMUSCULAR | Status: DC | PRN
  Administered 2024-01-10: 1 mg via INTRAVENOUS

## 2024-01-10 SURGICAL SUPPLY — 8 items
CATH 5FR JL3.5 JR4 ANG PIG MP (CATHETERS) IMPLANT
DEVICE RAD TR BAND REGULAR (VASCULAR PRODUCTS) IMPLANT
GLIDESHEATH SLEND SS 6F .021 (SHEATH) IMPLANT
GUIDEWIRE INQWIRE 1.5J.035X260 (WIRE) IMPLANT
KIT SINGLE USE MANIFOLD (KITS) IMPLANT
PACK CARDIAC CATHETERIZATION (CUSTOM PROCEDURE TRAY) ×1 IMPLANT
SET ATX-X65L (MISCELLANEOUS) IMPLANT
SHEATH PROBE COVER 6X72 (BAG) IMPLANT

## 2024-01-10 NOTE — Interval H&P Note (Signed)
 History and Physical Interval Note:  01/10/2024 12:12 PM  [name]  has presented today for surgery, with the diagnosis of abnormal ct.  The various methods of treatment have been discussed with the patient and family. After consideration of risks, benefits and other options for treatment, the patient has consented to  Procedure(s): LEFT HEART CATH AND CORONARY ANGIOGRAPHY (N/A) as a surgical intervention.  The patient's history has been reviewed, patient examined, no change in status, stable for surgery.  I have reviewed the patient's chart and labs.  Questions were answered to the patient's satisfaction.    Cath Lab Visit (complete for each Cath Lab visit)  Clinical Evaluation Leading to the Procedure:   ACS: No.  Non-ACS:    Anginal Classification: CCS IV  Anti-ischemic medical therapy: No Therapy  Non-Invasive Test Results: High-risk stress test findings: cardiac mortality >3%/year  Prior CABG: No previous CABG  Becky Turner

## 2024-01-10 NOTE — Progress Notes (Signed)
 TR band removed at 1445, gauze dressing applied. Right radial level 0, clean, dry and intact.

## 2024-01-10 NOTE — Discharge Instructions (Signed)

## 2024-01-11 ENCOUNTER — Encounter (HOSPITAL_COMMUNITY): Payer: Self-pay | Admitting: Internal Medicine

## 2024-01-17 ENCOUNTER — Other Ambulatory Visit (HOSPITAL_BASED_OUTPATIENT_CLINIC_OR_DEPARTMENT_OTHER): Payer: Self-pay

## 2024-01-17 ENCOUNTER — Encounter (HOSPITAL_BASED_OUTPATIENT_CLINIC_OR_DEPARTMENT_OTHER): Payer: Self-pay | Admitting: Internal Medicine

## 2024-01-17 ENCOUNTER — Encounter (HOSPITAL_BASED_OUTPATIENT_CLINIC_OR_DEPARTMENT_OTHER): Payer: Self-pay

## 2024-01-17 DIAGNOSIS — E7849 Other hyperlipidemia: Secondary | ICD-10-CM

## 2024-01-17 MED ORDER — REPATHA SURECLICK 140 MG/ML ~~LOC~~ SOAJ: 140.0000 mg | SUBCUTANEOUS | 6 refills | Status: AC

## 2024-01-24 LAB — CYTOCHROME P450 2C19

## 2024-01-25 MED ORDER — TICAGRELOR 60 MG PO TABS: 60.0000 mg | ORAL_TABLET | Freq: Two times a day (BID) | ORAL | 11 refills | Status: AC

## 2024-01-26 DIAGNOSIS — N1832 Chronic kidney disease, stage 3b: Secondary | ICD-10-CM | POA: Diagnosis not present

## 2024-01-31 NOTE — Progress Notes (Signed)
 " Cardiology Office Note   Date:  02/02/2024  ID:  Becky Turner, DOB 11-Dec-1945, MRN [id number] PCP: Thedora Garnette HERO, MD  [facility] Providers Cardiologist:  Vinie JAYSON Maxcy, MD     PMH Dyslipidemia CAD CT Calcium  score 329 (99th percentile) Heavy calcification of left main 3 V CAD Abnormal cardiac PET CT 12/29/23 >>concerning for ischemia LHC 01/10/2024 Chronic total occlusion of mid LAD with left-to-left and right-to-left collaterals supplying distal LAD Focal 70% stenosis mid LCx  Multifocal 50-60% RCA >>Med therapy Type 2 diabetes CKD Hypertension Probably familial hyperlipidemia LDL greater than 190 Medication noncompliance  Referred to Advanced Lipid Disorders clinic and seen by Dr. Maxcy 10/29/23. She had been seen by Dr. Ladona in the past, but not seen since 07/2020. She speaks Vietnamese and is assisted with her daughter who is translating.  She previously had good control of her lipids on multiple therapies but had been noncompliant.  Total cholesterol at time of visit was over 500.  There was concern for familial hyperlipidemia and per daughter's request LP(a) and coronary calcium  score were obtained.  She was advised to resume Repatha , ezetimibe , and atorvastatin.  NMR 02/02/2024 revealed LDL particle #355, LDL-C 41, HDL-C 87, triglycerides 105, small LDL-P < 90 and total cholesterol 147.  LP(a) minimally elevated at 95.9.   CT revealed high calcium  score of 329 (88th percentile) with heavy calcification in all 3 coronary arteries including left main in which the calcium  score was 245.  Because of chronic kidney disease she could not undergo coronary CT, so she underwent cardiac PET which revealed reversible perfusion defect in the anterior wall suggestive of anterior ischemia.  There was global reduction in myocardial blood flow which could suggest multivessel coronary disease or significant left main disease considered high risk study.  LVEF was 67%.  Based on these  findings cardiac catheterization was recommended.  She reported that she had been having shortness of breath and fatigue with minimal exertion.  She agreed to undergo LHC which was completed on 01/10/2024.  LHC 01/10/2024 revealed multivessel coronary artery disease including chronic total occlusion of mid LAD with left to left and right to left collaterals supplying the distal LAD, focal 70% stenosis in mid LCx and multifocal 50 to 60% disease involving RCA.  Favor medical therapy due to kidney disease, adding metoprolol  succinate 25 mg daily as well as as needed sublingual nitroglycerin .  If refractory symptoms occur, consider PCI to LCx +/- consultation with CTO team to review feasibility of LAD revascularization.  Given intolerance of aspirin , starting clopidogrel  75 mg daily.  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  She was switched to Brilinta  60 mg twice daily.  History of Present Illness Discussed the use of AI scribe software for clinical note transcription with the patient, who gave verbal consent to proceed.  History of Present Illness Becky Turner is a very pleasant 78 year old female who is here with her daughter for follow- up of coronary artery disease.  She understands some English but her daughter is primary interpreter for her.  Since hospital discharge, she is not having any chest pain, shortness of breath, or other symptoms concerning for angina.  She was previously treated with clopidogrel , which was not suitable, and is now on Brilinta  which Brilinta  causes a gnawing stomach sensation after taking it.  Her daughter is concerned that her BP is slightly elevated at 132/60.  She does not monitor BP at home.  She is generally not very active except for some upper body exercises which she demonstrates.  No formal exercise regimen. She has no lightheadedness, dizziness, excessive fatigue, chest pain, or shortness of breath.  She reports  she gets tired if she stands for too long which she describes as 2 hours.  She is not having any concerning side effects with her medications.  She generally follows a healthy diet. She lives with a different family member, not the daughter who is with her today.   ROS: See HPI  Studies Reviewed EKG Interpretation Date/Time:  Wednesday February 02 2024 10:39:52 EST Ventricular Rate:  50 PR Interval:  152 QRS Duration:  90 QT Interval:  468 QTC Calculation: 426 R Axis:   50  Text Interpretation: Sinus bradycardia When compared with ECG of 04-Jan-2024 11:03, No significant change was found Confirmed by Percy Browning [phone]) on 02/02/2024 10:43:09 AM     Lipoprotein (a)  Date/Time Value Ref Range Status  12/03/2023 12:32 PM 95.9 (H) <75.0 nmol/L Final    Comment:    Note:  Values greater than or equal to 75.0 nmol/L may        indicate an independent risk factor for CHD,        but must be evaluated with caution when applied        to non-Caucasian populations due to the        influence of genetic factors on Lp(a) across        ethnicities.     Risk Assessment/Calculations           Physical Exam VS:  BP 132/60 (BP Location: Left Arm, Patient Position: Sitting, Cuff Size: Normal)   Pulse (!) 49   Ht 5' 1 (1.549 m)   Wt 118 lb 6.4 oz (53.7 kg)   SpO2 100%   BMI 22.37 kg/m    Wt Readings from Last 3 Encounters:  02/02/24 118 lb 6.4 oz (53.7 kg)  01/10/24 120 lb (54.4 kg)  01/04/24 120 lb 1.6 oz (54.5 kg)    GEN: Well nourished, well developed in no acute distress NECK: No JVD; No carotid bruits CARDIAC: RRR, no murmurs, rubs, gallops RESPIRATORY:  Clear to auscultation without rales, wheezing or rhonchi  ABDOMEN: Soft, non-tender, non-distended EXTREMITIES:  No edema; No deformity    Assessment & Plan CAD without angina Multivessel coronary disease on cath 01/10/2024 with chronic total occlusion of mid LAD with left to left and right to left collaterals  supplying the distal LAD, focal 70% stenosis in mid LCx and multifocal 50 to 60% disease involving RCA.  She denies chest pain, dyspnea, or other symptoms concerning for angina.  She was non-responder to Plavix  so she was switched to Brilinta  60 mg twice daily which she tolerates but is having some stomach discomfort after taking.  She does take the medication with food. Advised taking it with food or soda to alleviate discomfort and call if it becomes unbearable. Emphasized the importance of anti-platelet thereapy.  She does some upper body exercises while sitting, but no formal exercise.  She is not interested and cardiac rehab.  BP improved on my recheck and is generally well-controlled.  Reducing Lopressor  in the setting of sinus bradycardia.  No indication for further ischemia evaluation/treatment at this time. -Reviewed red flag symptoms to report - Maintain good blood pressure control - Reduce metoprolol  succinate 25 mg daily to metoprolol  tartrate 12.5 mg twice daily - Continue rosuvastatin , ezetimibe , Repatha , losartan , metoprolol , Brilinta , Jardiance   CKD Stage 3b SCr 1.7 on labs completed 01/26/24. Has appointment with nephrologist on 02/07/24.  She avoids dark soda and does not take any NSAIDs. - Continue good hydration - Management per nephrology  Bradycardia   Her heart rate is 50 bpm, which is acceptable if asymptomatic. Metoprolol  may contribute to bradycardia. She denies lightheadedness, fatigue, presyncope or syncope. Feels tired if she stands for hours.  We will change metoprolol  from long-acting to short acting.  - Monitor for symptoms like lightheadedness, fatigue, chest pain, or dyspnea  - Change metoprolol  succinate 25 mg daily to metoprolol  tartrate 12.5 mg twice daily  Essential hypertension   Blood pressure is well-controlled, though diastolic is slightly low. We are switching metoprolol  from long-acting to short acting.  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  Lipid panel completed 12/03/2023 with LDL particle #355, LDL-C 41, HDL-C 87, triglycerides 105, total cholesterol 147, and small LDL particle number <90. Lipids are at goal.  She is tolerating rosuvastatin , ezetimibe , and Repatha  without concerning side effects.  She would like to take less medications but the importance of each of these was emphasized given significant CAD. - Continue Repatha  140 mg every 14 days - Continue rosuvastatin  and ezetimibe  daily  Medication management She would like to reduce pill burden.  Reviewed all of her cardiac medications and the importance of each.  She is agreeable to continue current medical therapy. -Ensure compliance with medication        Dispo: 3 months with Dr. Mona  Signed, Rosaline Bane, NP-C "

## 2024-02-02 ENCOUNTER — Encounter (HOSPITAL_BASED_OUTPATIENT_CLINIC_OR_DEPARTMENT_OTHER): Payer: Self-pay | Admitting: Nurse Practitioner

## 2024-02-02 ENCOUNTER — Ambulatory Visit (INDEPENDENT_AMBULATORY_CARE_PROVIDER_SITE_OTHER): Admitting: Nurse Practitioner

## 2024-02-02 VITALS — BP 132/60 | HR 49 | Ht 61.0 in | Wt 118.4 lb

## 2024-02-02 DIAGNOSIS — N1832 Chronic kidney disease, stage 3b: Secondary | ICD-10-CM | POA: Diagnosis not present

## 2024-02-02 DIAGNOSIS — E785 Hyperlipidemia, unspecified: Secondary | ICD-10-CM | POA: Diagnosis not present

## 2024-02-02 DIAGNOSIS — Z79899 Other long term (current) drug therapy: Secondary | ICD-10-CM | POA: Diagnosis not present

## 2024-02-02 DIAGNOSIS — I2510 Atherosclerotic heart disease of native coronary artery without angina pectoris: Secondary | ICD-10-CM

## 2024-02-02 DIAGNOSIS — E7849 Other hyperlipidemia: Secondary | ICD-10-CM | POA: Diagnosis not present

## 2024-02-02 DIAGNOSIS — R001 Bradycardia, unspecified: Secondary | ICD-10-CM

## 2024-02-02 DIAGNOSIS — I10 Essential (primary) hypertension: Secondary | ICD-10-CM

## 2024-02-02 MED ORDER — METOPROLOL TARTRATE 25 MG PO TABS: 12.5000 mg | ORAL_TABLET | Freq: Two times a day (BID) | ORAL | 2 refills | Status: AC

## 2024-02-02 NOTE — Patient Instructions (Signed)
 Medication Instructions:   DISCONTINUE Toprol  XL  START Lopressor  one half (0.5) tablet by mouth 12.5 mg twice daily.   *If you need a refill on your cardiac medications before your next appointment, please call your pharmacy*  Lab Work:  None ordered.  If you have labs (blood work) drawn today and your tests are completely normal, you will receive your results only by: MyChart Message (if you have MyChart) OR A paper copy in the mail If you have any lab test that is abnormal or we need to change your treatment, we will call you to review the results.  Testing/Procedures:  None ordered.   Follow-Up: At [facility], you and your health needs are our priority.  As part of our continuing mission to provide you with exceptional heart care, our providers are all part of one team.  This team includes your primary Cardiologist (physician) and Advanced Practice Providers or APPs (Physician Assistants and Nurse Practitioners) who all work together to provide you with the care you need, when you need it.  Your next appointment:   3 month(s)  Provider:   Vinie JAYSON Maxcy, MD    We recommend signing up for the patient portal called MyChart.  Sign up information is provided on this After Visit Summary.  MyChart is used to connect with patients for Virtual Visits (Telemedicine).  Patients are able to view lab/test results, encounter notes, upcoming appointments, etc.  Non-urgent messages can be sent to your provider as well.   To learn more about what you can do with MyChart, go to [email or web address].

## 2024-04-07 ENCOUNTER — Ambulatory Visit: Admitting: Family Medicine

## 2024-05-04 ENCOUNTER — Ambulatory Visit: Admitting: Internal Medicine
